# Patient Record
Sex: Female | Born: 1965 | Race: Black or African American | Hispanic: No | Marital: Single | Smoking: Never smoker
Health system: Southern US, Community
[De-identification: ages and names within clinical notes are randomized; demographics above are authoritative.]

## PROBLEM LIST (undated history)

## (undated) DIAGNOSIS — IMO0001 Reserved for inherently not codable concepts without codable children: Secondary | ICD-10-CM

## (undated) DIAGNOSIS — T7840XA Allergy, unspecified, initial encounter: Secondary | ICD-10-CM

## (undated) DIAGNOSIS — K429 Umbilical hernia without obstruction or gangrene: Secondary | ICD-10-CM

## (undated) DIAGNOSIS — M25569 Pain in unspecified knee: Secondary | ICD-10-CM

## (undated) DIAGNOSIS — K5790 Diverticulosis of intestine, part unspecified, without perforation or abscess without bleeding: Secondary | ICD-10-CM

## (undated) DIAGNOSIS — G8929 Other chronic pain: Secondary | ICD-10-CM

## (undated) DIAGNOSIS — I10 Essential (primary) hypertension: Secondary | ICD-10-CM

## (undated) DIAGNOSIS — Z780 Asymptomatic menopausal state: Secondary | ICD-10-CM

## (undated) DIAGNOSIS — K449 Diaphragmatic hernia without obstruction or gangrene: Secondary | ICD-10-CM

## (undated) DIAGNOSIS — K5720 Diverticulitis of large intestine with perforation and abscess without bleeding: Secondary | ICD-10-CM

## (undated) HISTORY — DX: Umbilical hernia without obstruction or gangrene: K42.9

## (undated) HISTORY — PX: MYOMECTOMY: SHX85

## (undated) HISTORY — DX: Asymptomatic menopausal state: Z78.0

## (undated) HISTORY — DX: Essential (primary) hypertension: I10

## (undated) HISTORY — DX: Diverticulosis of intestine, part unspecified, without perforation or abscess without bleeding: K57.90

## (undated) HISTORY — DX: Reserved for inherently not codable concepts without codable children: IMO0001

## (undated) HISTORY — DX: Diaphragmatic hernia without obstruction or gangrene: K44.9

## (undated) HISTORY — PX: TONSILLECTOMY: SUR1361

## (undated) HISTORY — DX: Allergy, unspecified, initial encounter: T78.40XA

## (undated) HISTORY — DX: Diverticulitis of large intestine with perforation and abscess without bleeding: K57.20

## (undated) HISTORY — PX: CHOLECYSTECTOMY: SHX55

---

## 1997-07-28 ENCOUNTER — Other Ambulatory Visit: Admission: RE | Admit: 1997-07-28 | Discharge: 1997-07-28 | Payer: Self-pay | Admitting: *Deleted

## 1997-09-01 ENCOUNTER — Other Ambulatory Visit: Admission: RE | Admit: 1997-09-01 | Discharge: 1997-09-01 | Payer: Self-pay | Admitting: Family Medicine

## 1997-11-02 ENCOUNTER — Inpatient Hospital Stay (HOSPITAL_COMMUNITY): Admission: AD | Admit: 1997-11-02 | Discharge: 1997-11-02 | Payer: Self-pay | Admitting: *Deleted

## 1998-11-13 ENCOUNTER — Emergency Department (HOSPITAL_COMMUNITY): Admission: EM | Admit: 1998-11-13 | Discharge: 1998-11-13 | Payer: Self-pay | Admitting: Emergency Medicine

## 1999-10-10 ENCOUNTER — Encounter: Payer: Self-pay | Admitting: Obstetrics & Gynecology

## 1999-10-10 ENCOUNTER — Inpatient Hospital Stay (HOSPITAL_COMMUNITY): Admission: AD | Admit: 1999-10-10 | Discharge: 1999-10-10 | Payer: Self-pay | Admitting: Obstetrics & Gynecology

## 2001-08-15 ENCOUNTER — Other Ambulatory Visit: Admission: RE | Admit: 2001-08-15 | Discharge: 2001-08-15 | Payer: Self-pay | Admitting: *Deleted

## 2003-01-21 ENCOUNTER — Emergency Department (HOSPITAL_COMMUNITY): Admission: EM | Admit: 2003-01-21 | Discharge: 2003-01-21 | Payer: Self-pay | Admitting: *Deleted

## 2005-01-02 ENCOUNTER — Encounter (INDEPENDENT_AMBULATORY_CARE_PROVIDER_SITE_OTHER): Payer: Self-pay | Admitting: *Deleted

## 2005-01-02 ENCOUNTER — Ambulatory Visit (HOSPITAL_COMMUNITY): Admission: EM | Admit: 2005-01-02 | Discharge: 2005-01-03 | Payer: Self-pay | Admitting: *Deleted

## 2006-09-05 ENCOUNTER — Emergency Department (HOSPITAL_COMMUNITY): Admission: EM | Admit: 2006-09-05 | Discharge: 2006-09-05 | Payer: Self-pay | Admitting: Emergency Medicine

## 2008-01-10 ENCOUNTER — Inpatient Hospital Stay (HOSPITAL_COMMUNITY): Admission: EM | Admit: 2008-01-10 | Discharge: 2008-01-13 | Payer: Self-pay | Admitting: Emergency Medicine

## 2008-01-10 ENCOUNTER — Ambulatory Visit: Payer: Self-pay | Admitting: Cardiology

## 2008-01-10 ENCOUNTER — Encounter (INDEPENDENT_AMBULATORY_CARE_PROVIDER_SITE_OTHER): Payer: Self-pay | Admitting: Internal Medicine

## 2008-01-13 ENCOUNTER — Encounter (INDEPENDENT_AMBULATORY_CARE_PROVIDER_SITE_OTHER): Payer: Self-pay | Admitting: Internal Medicine

## 2008-01-13 ENCOUNTER — Ambulatory Visit: Payer: Self-pay | Admitting: Surgery

## 2008-01-27 ENCOUNTER — Ambulatory Visit: Payer: Self-pay | Admitting: Family Medicine

## 2008-12-22 ENCOUNTER — Emergency Department (HOSPITAL_COMMUNITY): Admission: EM | Admit: 2008-12-22 | Discharge: 2008-12-22 | Payer: Self-pay | Admitting: Emergency Medicine

## 2009-03-30 ENCOUNTER — Encounter (INDEPENDENT_AMBULATORY_CARE_PROVIDER_SITE_OTHER): Payer: Self-pay | Admitting: *Deleted

## 2009-04-09 ENCOUNTER — Ambulatory Visit: Payer: Self-pay | Admitting: Internal Medicine

## 2009-04-09 DIAGNOSIS — I10 Essential (primary) hypertension: Secondary | ICD-10-CM | POA: Insufficient documentation

## 2009-04-12 ENCOUNTER — Encounter (INDEPENDENT_AMBULATORY_CARE_PROVIDER_SITE_OTHER): Payer: Self-pay | Admitting: *Deleted

## 2009-04-23 ENCOUNTER — Ambulatory Visit: Payer: Self-pay | Admitting: Internal Medicine

## 2009-04-27 LAB — CONVERTED CEMR LAB
Basophils Absolute: 0.1 10*3/uL (ref 0.0–0.1)
Basophils Relative: 1.9 % (ref 0.0–3.0)
CO2: 33 meq/L — ABNORMAL HIGH (ref 19–32)
Chloride: 103 meq/L (ref 96–112)
Eosinophils Relative: 5.1 % — ABNORMAL HIGH (ref 0.0–5.0)
MCV: 96 fL (ref 78.0–100.0)
Monocytes Relative: 7.5 % (ref 3.0–12.0)
Neutro Abs: 1.7 10*3/uL (ref 1.4–7.7)
Neutrophils Relative %: 34.8 % — ABNORMAL LOW (ref 43.0–77.0)
RBC: 4.02 M/uL (ref 3.87–5.11)

## 2009-05-04 ENCOUNTER — Emergency Department (HOSPITAL_COMMUNITY): Admission: EM | Admit: 2009-05-04 | Discharge: 2009-05-04 | Payer: Self-pay | Admitting: Emergency Medicine

## 2009-07-02 ENCOUNTER — Ambulatory Visit: Payer: Self-pay | Admitting: Internal Medicine

## 2009-07-02 ENCOUNTER — Encounter (INDEPENDENT_AMBULATORY_CARE_PROVIDER_SITE_OTHER): Payer: Self-pay | Admitting: *Deleted

## 2009-09-07 ENCOUNTER — Emergency Department (HOSPITAL_COMMUNITY): Admission: EM | Admit: 2009-09-07 | Discharge: 2009-09-07 | Payer: Self-pay | Admitting: Emergency Medicine

## 2009-09-07 ENCOUNTER — Encounter: Payer: Self-pay | Admitting: Internal Medicine

## 2009-10-13 ENCOUNTER — Emergency Department (HOSPITAL_COMMUNITY): Admission: EM | Admit: 2009-10-13 | Discharge: 2009-10-13 | Payer: Self-pay | Admitting: Family Medicine

## 2009-10-15 ENCOUNTER — Ambulatory Visit: Payer: Self-pay | Admitting: Internal Medicine

## 2009-10-15 DIAGNOSIS — J189 Pneumonia, unspecified organism: Secondary | ICD-10-CM

## 2009-10-18 ENCOUNTER — Telehealth (INDEPENDENT_AMBULATORY_CARE_PROVIDER_SITE_OTHER): Payer: Self-pay | Admitting: *Deleted

## 2009-10-18 ENCOUNTER — Encounter (INDEPENDENT_AMBULATORY_CARE_PROVIDER_SITE_OTHER): Payer: Self-pay | Admitting: *Deleted

## 2009-10-19 LAB — CONVERTED CEMR LAB
Calcium: 9.2 mg/dL (ref 8.4–10.5)
Glucose, Bld: 89 mg/dL (ref 70–99)
Sodium: 136 meq/L (ref 135–145)

## 2009-10-20 ENCOUNTER — Encounter (INDEPENDENT_AMBULATORY_CARE_PROVIDER_SITE_OTHER): Payer: Self-pay | Admitting: *Deleted

## 2009-10-30 ENCOUNTER — Emergency Department (HOSPITAL_COMMUNITY): Admission: EM | Admit: 2009-10-30 | Discharge: 2009-10-30 | Payer: Self-pay | Admitting: Emergency Medicine

## 2010-03-24 NOTE — Letter (Signed)
Summary: sprain finger   Emergency Department Visit / [facility]   Imported By: Lennie Odor 09/15/2009 13:51:39  _____________________________________________________________________  External Attachment:    Type:   Image     Comment:   External Document

## 2010-03-24 NOTE — Letter (Signed)
Summary: [place] No Show Letter  [facility] [place]  [address] [place], [place] [address]   Phone: [phone]  Fax: [phone]    07/02/2009 MRN: [id number]  [facility] [address] [place], [place]  [address]   Dear Ms. Wierzbicki,   Our records indicate that you missed your scheduled appointment with Dr. Drue Novel on 07/02/09.  Please contact this office to reschedule your appointment as soon as possible.  It is important that you keep your scheduled appointments with your physician, so we can provide you the best care possible.  Please be advised that there may be a charge for "no show" appointments.    Sincerely,    at [facility]

## 2010-03-24 NOTE — Progress Notes (Signed)
Summary: Still not feeling better....  Phone Note Call from Patient Call back at [facility] Phone [phone]   Caller: Patient Summary of Call: Patient called and LM on triage VM stating that she was still having diarrhea and needs an inhaler because she is a little SOB. Will call back for more information.   Patient is still having severe diarrhea, everytime she has to urinate it is coming out like water. She is taking the Levaquin and it is not helping. Patient was supposed to return to work today but was unable to because of the diahrrea. Patient said that her chest felt tight on saturday and used a warm cloth to help relieve this. SHe is taking the Mucinex D and it has helped break up the congestion a little and was thinking she might need an ihaler or should she let this take its course. Please advise.  Initial call taken by: Harold Barban,  October 18, 2009 10:48 AM  Follow-up for Phone Call        hold Levaquin for 2 days Pepto-Bismol for diarrhea Start  Align ( probiotic) one tablet daily continue Mucinex DM let us know if she is not slowly improving her potassium is slightly low, start KCl 10 mEq daily, call 30 and 3 RF Follow-up by: Jose E. Paz MD,  October 18, 2009 1:35 PM  Additional Follow-up for Phone Call Additional follow up Details #1::        Patient is aware, samples given of Align with coupons.  Pharmacy: [facility] [address] Additional Follow-up by: Harold Barban,  October 18, 2009 1:40 PM    New/Updated Medications: KLOR-CON 10 10 MEQ CR-TABS (POTASSIUM CHLORIDE) 1 by mouth daily. Prescriptions: KLOR-CON 10 10 MEQ CR-TABS (POTASSIUM CHLORIDE) 1 by mouth daily.  #30 x 3   Entered by:   Army Fossa CMA   Authorized by:   Nolon Rod. Paz MD   Signed by:   Army Fossa CMA on 10/18/2009   Method used:   Electronically to        [facility] (retail)       [address]       [place], [place]  [address]       Ph: [phone]  Fax: [phone]   RxID:   [id number]

## 2010-03-24 NOTE — Letter (Signed)
Summary: Primary Care Consult Scheduled Letter  [facility] [place]  [address] [place], [place] [address]   Phone: [phone]  Fax: [phone]      04/12/2009 MRN: [id number]  [facility] [address] [place], [place]  [address]    Dear [name],    We have scheduled an appointment for you.  At the recommendation of Dr. Willow Ora, we have scheduled you a consult with Dr. Conley Simmonds with [facility] on 04-30-2009 arrive 8:00am.  Their address is [address], [address], [place] [address]. The office phone number is [phone].  If this appointment day and time is not convenient for you, please feel free to call the office of the doctor you are being referred to at the number listed above and reschedule the appointment.    It is important for you to keep your scheduled appointments. We are here to make sure you are given good patient care.   Thank you,    Renee, Patient Care Coordinator [facility] [place]    **IF YOU ARE UNABLE TO KEEP THIS APPOINTMENT, OR NEED TO RESCHEDULE, PLEASE GIVE DR. SILVA'S OFFICE A 24 HOUR NOTICE TO AVOID ANY FEES**

## 2010-03-24 NOTE — Assessment & Plan Note (Signed)
Summary: bp check,bmp,cbc, dx hypertension/kdc  Nurse Visit   Vital Signs:  Patient profile:   45 year old female Height:      69 inches Weight:      213 pounds BMI:     31.57 Pulse rate:   86 / minute BP sitting:   122 / 80  Impression & Recommendations:  Problem # 1:  HYPERTENSION (ICD-401.9) BP better Her updated medication list for this problem includes:    Hydrochlorothiazide 25 Mg Tabs (Hydrochlorothiazide) .Marland Kitchen... 1 by mouth once daily    Clonidine Hcl 0.2 Mg Tabs (Clonidine hcl) .Marland Kitchen..Marland Kitchen Two times a day  Orders: Venipuncture (98119) TLB-BMP (Basic Metabolic Panel-BMET) (80048-METABOL) TLB-CBC Platelet - w/Differential (85025-CBCD) Est. Patient Level I ([address])  BP today: 122/80 Prior BP: 170/120 (04/09/2009)  Complete Medication List: 1)  Hydrochlorothiazide 25 Mg Tabs (Hydrochlorothiazide) .Marland Kitchen.. 1 by mouth once daily 2)  Clonidine Hcl 0.2 Mg Tabs (Clonidine hcl) .... Two times a day  CC: bp check  S Hawks, CMA   Allergies: No Known Drug Allergies  Orders Added: 1)  Venipuncture [36415] 2)  TLB-BMP (Basic Metabolic Panel-BMET) [80048-METABOL] 3)  TLB-CBC Platelet - w/Differential [85025-CBCD] 4)  Est. Patient Level I [95621]

## 2010-03-24 NOTE — Assessment & Plan Note (Signed)
Summary: FOLLOWUP FROM UC PER UC NEEDED FOLLOWUP WITH 2 DAYS/KN   Vital Signs:  Patient profile:   45 year old female Weight:      214.25 pounds O2 Sat:      93 % on Room air Temp:     99.4 degrees F oral Pulse rate:   97 / minute Pulse rhythm:   regular BP sitting:   126 / 84  (left arm) Cuff size:   large  Vitals Entered By: Army Fossa CMA (October 15, 2009 10:30 AM)  O2 Flow:  Room air CC: F/u from UC- diagonoised with Pneumoia.    History of Present Illness: the patient was evaluated at the urgent care on 10-13-09 She had frontal-around the eyes headaches, chills, fever, some cough chart was reviewed :  A chest x-ray showed a question of lower lobe pneumonia CBC showed white count of 6.5, hemoglobin 11.9, platelets 337 she was prescribed clarithromycin which she is taking  ROS Not feeling much better, still feeling weak, has postnasal dripping and cough. Still has head pressure No appetite Has developed diarrhea which is watery, nonbloody. Symptoms are started after she started antibiotics No nausea vomiting As far as her blood pressure, she has checked that rarely. At the urgent for her blood pressure was 168/97.  Current Medications (verified): 1)  Hydrochlorothiazide 25 Mg Tabs (Hydrochlorothiazide) .Marland Kitchen.. 1 By Mouth Once Daily 2)  Clonidine Hcl 0.2 Mg Tabs (Clonidine Hcl) .... Two Times A Day 3)  Biaxin 500 Mg Tabs (Clarithromycin) .Marland Kitchen.. 1 By Mouth Two Times A Day  Allergies (verified): No Known Drug Allergies  Past History:  Past Medical History: Reviewed history from 04/09/2009 and no changes required. G1 P1 Hypertension Dx at age 70 menopause (LMP age 45)  Past Surgical History: Reviewed history from 04/09/2009 and no changes required. Cholecystectomy Tonsillectomy  Social History: Reviewed history from 04/09/2009 and no changes required. 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   Head:  face symmetric, nontender to palpation Eyes:  external ocular movements intact Nose:  definitely congested Lungs:  normal respiratory effort, no intercostal retractions, no accessory muscle use, and normal breath sounds.  no increased work of breathing Heart:  normal rate, regular rhythm, no murmur, and no gallop.     Impression & Recommendations:  Problem # 1:  PNEUMONIA (ICD-486) Assessment New patient presents with fever, cough, head pressure, see HPI Likely has pneumonia and sinusitis Biaxin is causing diarrhea plan:  See instructions Her updated medication list for this problem includes:    Levaquin 500 Mg Tabs (Levofloxacin) .Marland Kitchen... 1 by mouth daily for one week  Problem # 2:  HYPERTENSION (ICD-401.9) BP seems to okay today, no change Her updated medication list for this problem includes:    Hydrochlorothiazide 25 Mg Tabs (Hydrochlorothiazide) .Marland Kitchen... 1 by mouth once daily    Clonidine Hcl 0.2 Mg Tabs (Clonidine hcl) .Marland Kitchen..Marland Kitchen Two times a day  Orders: Venipuncture ([address]) TLB-BMP (Basic Metabolic Panel-BMET) (80048-METABOL) Specimen Handling ([address])  BP today: 126/84 Prior BP: 122/80 (04/23/2009)  Labs Reviewed: K+: 3.4 (04/23/2009) Creat: : 0.8 (04/23/2009)     Complete Medication List: 1)  Hydrochlorothiazide 25 Mg Tabs (Hydrochlorothiazide) .Marland Kitchen.. 1 by mouth once daily 2)  Clonidine Hcl 0.2 Mg Tabs (Clonidine hcl) .... Two times a day 3)  Levaquin 500 Mg Tabs (Levofloxacin) .Marland Kitchen.. 1 by mouth daily for one week  Patient Instructions:  1)  rest, fluids, Tylenol 500 mg one or 2 tablets every 6 hours as needed for fever or pain 2)  switch to Levaquin, a new antibiotic 3)  Mucinex DM twice a day for one week as needed for cough 4)  Use the samples of ASTEPRO 2  sprays on each side of the nose daily to help the nose congestion 5)  Come back in 3 months for a  office visit 6)  Call the office if you're not well in 2 weeks  or at any time if you feel worse Prescriptions: LEVAQUIN 500 MG TABS (LEVOFLOXACIN) 1 by mouth daily for one week  #7 x 0   Entered and Authorized by:   Nolon Rod. Paz MD   Signed by:   Nolon Rod. Paz MD on 10/15/2009   Method used:   Print then Give to Patient   RxID:   [id number]

## 2010-03-24 NOTE — Assessment & Plan Note (Signed)
Summary: NEW TO EST,BLD PRESSURE,UHC INS/RH.....   Vital Signs:  Patient profile:   45 year old female LMP:     2006 Height:      69 inches Weight:      213 pounds BMI:     31.57 O2 Sat:      99 % on Room air Temp:     98.6 degrees F oral Pulse rate:   79 / minute BP sitting:   170 / 120  (left arm) Cuff size:   large  Vitals Entered By: Shary Decamp (April 09, 2009 11:02 AM)  O2 Flow:  Room air CC: new pt, elevated BP LMP (date): 2006     Enter LMP: 2006 Last PAP Result normal   Serial Vital Signs/Assessments:  Time      Position  BP       Pulse  Resp  Temp     By 11:04 AM  R Arm     166/120                        Shary Decamp   History of Present Illness: patient was diagnosed with hypertension at age 87 , was essentially untreated x years  Two years ago she briefly used  lisinopril but she self discontinued because she "felt bad"  on  November 2010, apparently her blood pressure was extremely high, she was seen at [facility]. She was started on hydrochlorothiazide and clonodine, since then she f/u  at the urgent care as needed. With above  medication, her blood pressure was within normal limits according to the patient, and  she was feeding well.  She is here  to be established on for a follow-up on her BP. She ran out of medications two weeks ago.  Preventive Screening-Counseling & Management  Alcohol-Tobacco     Smoking Status: never     Passive Smoke Exposure: no  Caffeine-Diet-Exercise     Caffeine use/day: 0     Does Patient Exercise: no      Drug Use:  no.    Current Medications (verified): 1)  Hydrochlorothiazide 25 Mg Tabs (Hydrochlorothiazide) .Marland Kitchen.. 1 By Mouth Once Daily 2)  Clonidine Hcl 0.2 Mg Tabs (Clonidine Hcl) .... 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 CV:  Denies chest pain or discomfort and swelling of feet. Resp:  Denies cough and shortness of breath. GI:  Denies diarrhea, nausea, and vomiting. Neuro:  mild HAs .  Physical Exam  General:  alert, well-developed, and overweight-appearing.   Neck:  no thyromegaly.   Lungs:  normal respiratory effort, no intercostal retractions, no accessory muscle use, and normal breath sounds.   Heart:  normal rate, regular rhythm, no murmur, and no gallop.   Abdomen:  soft, non-tender, no distention, and no masses.   Extremities:  no edema Psych:  not  anxious appearing and not depressed appearing.     Impression & Recommendations:  Problem # 1:  HYPERTENSION (ICD-401.9) see HPI off  medications for two weeks, reportedly her BP was well controlled while on medications restart medication see instructions EKG NSR Her updated medication list for this problem includes:    Hydrochlorothiazide 25 Mg Tabs (Hydrochlorothiazide) .Marland Kitchen... 1 by mouth once daily    Clonidine Hcl 0.2 Mg Tabs (Clonidine hcl) .Marland Kitchen..Marland Kitchen Two times a day  Orders: EKG w/ Interpretation (93[address])  Problem # 2:  ROUTINE GENERAL MEDICAL EXAM@HEALTH  CARE FACL (ICD-V70.0) Assessment: Comment Only  has not seen a gynecologist in a while, referral  Orders: Gynecologic Referral (Gyn)  Complete Medication List: 1)  Hydrochlorothiazide 25 Mg Tabs (Hydrochlorothiazide) .Marland Kitchen.. 1 by mouth once daily 2)  Clonidine Hcl 0.2 Mg Tabs (Clonidine hcl) .... Two times a day  Patient Instructions: 1)  restart meds  2)  nurse  visit in two weeks: BP check, BMP, CBC Dx  hypertension 3)  Please schedule a follow-up appointment in 3 months .  Prescriptions: CLONIDINE HCL 0.2 MG TABS (CLONIDINE HCL) two times a day  #60 x 3   Entered and Authorized by:   Elita Quick E. Ariadna Setter MD   Signed by:   Nolon Rod. Saisha Hogue MD on 04/09/2009   Method used:   Print then Give to Patient   RxID:   [id number] HYDROCHLOROTHIAZIDE 25 MG TABS (HYDROCHLOROTHIAZIDE) 1 by mouth once daily  #30 x 3   Entered and Authorized by:   Nolon Rod. Creedon Danielski MD   Signed by:   Nolon Rod. Buel Molder MD on 04/09/2009   Method used:   Print then Give to Patient   RxID:   [id number]    Risk Factors:  Tobacco use:  never Passive smoke exposure:  no Drug use:  no Caffeine use:  0 drinks per day Alcohol use:  yes    Comments:  socially Exercise:  no  PAP Smear History:     Date of Last PAP Smear:  02/21/2008    Results:  normal     Immunization History:  Tetanus/Td Immunization History:    Tetanus/Td:  historical (02/20/2006)  Influenza Immunization History:    Influenza:  historical (11/20/2008)    Preventive Care Screening  Pap Smear:    Date:  02/21/2008    Results:  normal   Last Tetanus Booster:    Date:  02/20/2006    Results:  Historical

## 2010-03-24 NOTE — Miscellaneous (Signed)
  Clinical Lists Changes  Medications: Changed medication from KLOR-CON 10 10 MEQ CR-TABS (POTASSIUM CHLORIDE) 1 by mouth daily. to KLOR-CON M20 20 MEQ CR-TABS (POTASSIUM CHLORIDE CRYS CR) 1 by mouth daily

## 2010-03-24 NOTE — Letter (Signed)
Summary: Out of Work  [facility]  [address] [place], [place] [address]   Phone: [phone]  Fax: [phone]    October 18, 2009   Employee:  LISAANNE LAWRIE    To Whom It May Concern:   For Medical reasons, please excuse the above named employee from work for the following dates:  Start: 10/18/09    End: 10/18/09  If you need additional information, please feel free to contact our office.         Sincerely,    Harold Barban

## 2010-03-24 NOTE — Letter (Signed)
Summary: New Patient Letter  [facility] [place]  [address] [place], [place] [address]   Phone: [phone]  Fax: [phone]       03/30/2009 MRN: [id number]  [facility] [address] [place], [place]  [address]  Dear Ms. Sara Norris,   Welcome to [facility] and thank you for choosing [place] as your Primary Care Providers. Enclosed you will find information about our practice that we hope you find helpful. We have also enclosed forms to be filled out prior to your visit. This will provide [place] with the necessary information and facilitate your being seen in a timely manner. If you have any questions, please call us at: [phone] and we will be happy to assist you. We look forward to seeing you at your scheduled appointment time.  Appointment Friday, April 09, 2009 at 10:00am  with Dr. Willow Ora  Sincerely,  Primary Health Care Team  Please arrive 15 minutes early for your first appointment and bring your insurance card. Co-pay is required at the time of your visit.  *****Please call the office if you are not able to keep this appointment. There is a charge of $50.00 if any appointment is not cancelled or rescheduled within 24 hours.

## 2010-05-03 ENCOUNTER — Telehealth (INDEPENDENT_AMBULATORY_CARE_PROVIDER_SITE_OTHER): Payer: Self-pay | Admitting: *Deleted

## 2010-05-06 LAB — DIFFERENTIAL
Eosinophils Relative: 6 % — ABNORMAL HIGH (ref 0–5)
Monocytes Absolute: 0.6 10*3/uL (ref 0.1–1.0)
Neutro Abs: 3.5 10*3/uL (ref 1.7–7.7)
Neutrophils Relative %: 54 % (ref 43–77)

## 2010-05-06 LAB — CBC
HCT: 35.6 % — ABNORMAL LOW (ref 36.0–46.0)
Hemoglobin: 11.9 g/dL — ABNORMAL LOW (ref 12.0–15.0)
MCHC: 33.4 g/dL (ref 30.0–36.0)
Platelets: 337 10*3/uL (ref 150–400)
WBC: 6.5 10*3/uL (ref 4.0–10.5)

## 2010-05-19 NOTE — Progress Notes (Signed)
Summary: clonidine, hctx refill--pt is out  Phone Note Refill Request Call back at Home Phone [phone] Message from:  Patient on May 03, 2010 4:46 PM  Refills Requested: Medication #1:  CLONIDINE HCL 0.2 MG TABS two times a day. DUE FOR OFFICE VISIT.  Medication #2:  HYDROCHLOROTHIAZIDE 25 MG TABS 1 by mouth once daily [facility] [place]     ----pt has been out of meds for two days, please fill as soon as possible  Next Appointment Scheduled: none Initial call taken by: Jerolyn Shin,  May 03, 2010 4:47 PM  Follow-up for Phone Call        Pt is due for an OV. Army Fossa CMA  May 03, 2010 4:54 PM   Additional Follow-up for Phone Call Additional follow up Details #1::        ok  30 days, 1 Rf no further RF w/o OV Jose E. Paz MD  May 05, 2010 1:13 PM     Additional Follow-up for Phone Call Additional follow up Details #2::    Left message for pt to call back. Army Fossa CMA  May 05, 2010 1:38 PM  Per Minerva Areola conversion, will sign off on this note---will make copy so it can be tracked.Marland KitchenMarland KitchenJerolyn Shin  May 09, 2010 5:10 PM   Prescriptions: CLONIDINE HCL 0.2 MG TABS (CLONIDINE HCL) two times a day. DUE FOR OFFICE VISIT.  #60 x 0   Entered by:   Army Fossa CMA   Authorized by:   Nolon Rod. Paz MD   Signed by:   Army Fossa CMA on 05/03/2010   Method used:   Electronically to        [facility] (retail)       [address]       [place], [place]  [address]       Ph: [phone]       Fax: [phone]   RxID:   [id number] HYDROCHLOROTHIAZIDE 25 MG TABS (HYDROCHLOROTHIAZIDE) 1 by mouth once daily  #30 Each x 0   Entered by:   Army Fossa CMA   Authorized by:   Nolon Rod. Paz MD   Signed by:   Army Fossa CMA on 05/03/2010   Method used:   Electronically to        [facility] (retail)       [address]       [place], [place]  [address]       Ph: [phone]       Fax:  [phone]   RxID:   [id number]

## 2010-05-21 ENCOUNTER — Encounter: Payer: Self-pay | Admitting: Internal Medicine

## 2010-05-24 ENCOUNTER — Ambulatory Visit (INDEPENDENT_AMBULATORY_CARE_PROVIDER_SITE_OTHER): Payer: 59 | Admitting: Internal Medicine

## 2010-05-24 ENCOUNTER — Encounter: Payer: Self-pay | Admitting: Internal Medicine

## 2010-05-24 ENCOUNTER — Ambulatory Visit: Payer: Self-pay | Admitting: Internal Medicine

## 2010-05-24 DIAGNOSIS — I10 Essential (primary) hypertension: Secondary | ICD-10-CM

## 2010-05-24 DIAGNOSIS — Z0000 Encounter for general adult medical examination without abnormal findings: Secondary | ICD-10-CM | POA: Insufficient documentation

## 2010-05-24 NOTE — Progress Notes (Signed)
  Subjective:    Patient ID: Sara Norris, female    DOB: 1965-10-21, 45 y.o.   MRN: [id number]  HPI  Was seen 09-2009 ; at that time she had pneumonia, she recovered completely  Her blood pressure was okay and continue to be well controlled per pt  Past Medical History  Diagnosis Date  . Hypertension     dx age 55  . Menopause     LMP age 18     Review of Systems  HENT:       H/o allergies: throat , eyes itching, SX > night. Allegra helps   Cardiovascular: Negative for chest pain, palpitations and leg swelling.  Genitourinary:       ++ hot flashes   Neurological: Negative for dizziness. Headaches: "allergy HAs"       Objective:   Physical Exam  Constitutional: She appears well-developed.       overweight  Cardiovascular: Normal rate, regular rhythm and normal heart sounds.   Pulmonary/Chest: Effort normal and breath sounds normal. No respiratory distress. She has no wheezes. She has no rales.  Musculoskeletal: She exhibits no edema.  Psychiatric: She has a normal mood and affect. Her behavior is normal. Judgment and thought content normal.          Assessment & Plan:

## 2010-05-24 NOTE — Assessment & Plan Note (Signed)
Refer to a new Gyn See instructions

## 2010-05-24 NOTE — Assessment & Plan Note (Addendum)
Well controlled Self d/c K+ suplements, eating more bananas Labs, if K+ still low consider change to maxzide

## 2010-05-25 LAB — BASIC METABOLIC PANEL
BUN: 16 mg/dL (ref 6–23)
CO2: 30 mEq/L (ref 19–32)
Calcium: 9.6 mg/dL (ref 8.4–10.5)
Creatinine, Ser: 0.9 mg/dL (ref 0.4–1.2)
GFR: 93.15 mL/min (ref >60.00)
Potassium: 4.2 mEq/L (ref 3.5–5.1)
Sodium: 140 mEq/L (ref 135–145)

## 2010-05-30 ENCOUNTER — Telehealth: Payer: Self-pay | Admitting: *Deleted

## 2010-05-30 NOTE — Telephone Encounter (Signed)
Message copied by Army Fossa on Mon May 30, 2010 11:33 AM ------      Message from: Sara Norris      Created: Thu May 26, 2010  3:02 PM       Advise patient:      Her potassium is normal. Good results, continue with same medicines

## 2010-05-30 NOTE — Telephone Encounter (Signed)
Message left for patient to return my call.  

## 2010-05-31 NOTE — Telephone Encounter (Signed)
I spoke w/ pt she is aware.  

## 2010-06-29 ENCOUNTER — Other Ambulatory Visit: Payer: Self-pay | Admitting: *Deleted

## 2010-06-29 MED ORDER — CLONIDINE HCL 0.2 MG PO TABS: 0.2000 mg | ORAL_TABLET | Freq: Two times a day (BID) | ORAL | Status: DC

## 2010-06-30 ENCOUNTER — Other Ambulatory Visit: Payer: Self-pay | Admitting: *Deleted

## 2010-06-30 MED ORDER — CLONIDINE HCL 0.2 MG PO TABS: 0.2000 mg | ORAL_TABLET | Freq: Two times a day (BID) | ORAL | Status: DC

## 2010-07-05 NOTE — Consult Note (Signed)
NAME:  Sara Norris, BUDGE NO.:  [id number]   MEDICAL RECORD NO.:  [id number]          PATIENT TYPE:  EMS   LOCATION:  ED                           FACILITY:  [facility]   PHYSICIAN:  Levert Feinstein, MD          DATE OF BIRTH:  1965-09-08   DATE OF CONSULTATION:  01/10/2008  DATE OF DISCHARGE:                                 CONSULTATION   REFERRING PHYSICIAN:  April Palumbo-Rasch, MD   CHIEF COMPLAINT:  This is acute stroke consult, potential TPA candidate,  from the [facility] ER physician Dr. April Palumbo-Rasch.   HISTORY OF PRESENT ILLNESS:  The patient is a 45 year old, right-handed  [place] female, developed few weak history of hollow cranial  pressure headache, with elevated blood pressure. This morning at work,  she noticed some right facial numbness and feeling generalized fatigue  and weakness.  She went downstairs to get some aspirin.  She reported  she  developed hyperventilation, later passed out, and code blue was  called.  The blood pressure was 195/120, glucose was 137.  The patient  was tachycardic.  Her blood pressure was much improved after she arrived  in ED, getting labetalol, and she overall reported feeling much better.   She had a history of hypertension for more than 20 years.  Supposed to  take lisinopril.  Recently switched to hydrochlorothiazide, but she has  not been taking any medication for 6 months, is in the process of  finding a primary care.  Mother at the bedside also reported that the  patient is under a lot of stress recently, financial stress, and her ex-  husband is critically ill.   REVIEW OF SYSTEMS:  The patient denies headache, chest pain, lateralized  motor sensory deficit, vision change, speech difficulty.  There was some  intermittent right facial numbness.  It actually has been going on for 2  days.   PAST MEDICAL HISTORY:  Hypertension.   SURGICAL HISTORY:  Cholecystectomy.   SOCIAL HISTORY:  She works at [employer], cleaning  patient's rooms, for 2 years.  Denies smoking, drinks, or illicit drug  use.   FAMILY HISTORY:  Significant for hypertension, diabetes.   PHYSICAL EXAMINATION:  Initial blood pressure was 195/121, heart rate of  93, and when I checked on her and after labetalol treatment, blood  pressure still elevated at 180/100, heart rate of 89.  CARDIAC:  Regular rate and rhythm.  PULMONARY:  Clear to auscultation bilaterally.  NECK:  Supple.  No carotid bruits.  NEUROLOGIC EXAMINATION:  She is awake, alert, oriented to history taking  and casual conversation.  There was no dysarthria and no aphasia.  Cranial nerves II-XII:  Pupils equal, round, reactive to light.  Right  fundi were sharp.  Arterioles were thinning and shiny and extraocular  movements were full.  Facial sensation and strength was normal.  Uvula  and tongue midline.  Head-turning and shoulder-shrugging were normal and  symmetric.  Tongue protrusion and cheek strength was normal.  Visual  fields were full on confrontational test.  Motor examination:  Normal tone, power and strength.  SENSORY:  Normal  to light touch, pinprick.  Deep tendon reflex was normal, symmetric.  Plantar responses were flexor.  COORDINATION:  Normal finger-to-nose,  heel-to-shin.  Gait was deferred.   CAT scan of the brain revealed it was normal.  There was no acute  change.   LAB EVALUATION:  INR 1.0.  Normal CBC, CMP   ASSESSMENT/PLAN:  A 45 year old female, noncompliant with her  medication, presenting with syncope, hyperventilation anxiety episode  with a mild right facial numbness, essentially normal neurological  examination.  Differentiation diagnosis including hypertension, urgency,  less likely represents a true stroke.   1. She is advised to continue to take her blood pressure medication.      Goal blood pressure is less than 130/80.  2. Aspirin every day.  3. MRI of brain.   Thank you for the  consult.  Please call for new questions.      Levert Feinstein, MD  Electronically Signed     YY/MEDQ  D:  01/10/2008  T:  01/10/2008  Job:  [id number]

## 2010-07-05 NOTE — H&P (Signed)
NAME:  Sara Norris, Sara Norris NO.:  [id number]   MEDICAL RECORD NO.:  [id number]          PATIENT TYPE:  EMS   LOCATION:  ED                           FACILITY:  [facility]   PHYSICIAN:  Hillery Aldo, M.D.   DATE OF BIRTH:  1965-06-11   DATE OF ADMISSION:  01/10/2008  DATE OF DISCHARGE:                              HISTORY & PHYSICAL   PRIMARY CARE PHYSICIAN:  None.   CHIEF COMPLAINTS:  Syncope, right facial paresthesias.   HISTORY OF PRESENT ILLNESS:  The patient is a 45 year old female with  past medical history of hypertension.  She has not taken any  antihypertensive medications for approximately the last 6 months.  Over  the past 2 days, she has noticed intermittent right-sided facial  paresthesias which became concerning to her today.  While at work today,  the patient began to tell coworkers that she was suffering with a  headache and did not feel well.  She subsequently sat down and per her  coworkers' report started shaking all over and subsequently slid on to  the floor.  There was no loss of bladder control or mouth trauma noted.  She apparently lost consciousness for a few seconds twice afterwards and  was subsequently brought to the emergency department for evaluation  where she was noted to have a blood pressure of 195/121.  The patient is  being admitted for further evaluation and workup along with treatment of  her hypertensive crisis.   PAST MEDICAL HISTORY:  1. Untreated hypertension, previously treated with      hydrochlorothiazide, none in the last 6 months.  2. Laparoscopic cholecystectomy in November of 2006.   FAMILY HISTORY:  The patient's mother is 23 and has diabetes and  hypertension.  The patient's father is in his 48s and has hypertension.  She has two healthy siblings.   SOCIAL HISTORY:  The patient is divorced.  She lives with her 15-year-  old son.  She is a lifelong nonsmoker.  She drinks alcohol rarely on  social occasions.  She  denies any drug use.  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   DRUG ALLERGIES:  CODEINE.  The patient is also intolerant of LISINOPRIL.   CURRENT MEDICATIONS:  None   REVIEW OF SYSTEMS:  The patient denies fever or chills.  She reports  headache as noted above.  No nausea, vomiting or diarrhea.  Bowels are  moving normally.  No melena or hematochezia.  No chest pain.  She had  some dyspnea with the episode of syncope today but otherwise does not  suffer with dyspnea.  She denies cough.  Her energy level was  diminished.  She reports a 2-3 pound weight gain in the past 6 months.  She denies any diplopia, but did have some transient blurry vision.   PHYSICAL EXAM:  VITAL SIGNS:  Temperature 98.4, pulse 84, respirations  18, blood pressure on arrival 195/121, most recently recorded blood  pressure 128/84.  O2 saturation 100% on room air.  GENERAL:  Obese black female in no acute distress.  HEENT:  Normocephalic, atraumatic.  PERRL.  EOMI.  Visual fields are  full without deficits.  Oropharynx is clear.  Tongue is midline.  Palate  rises symmetrically.  NECK:  Supple, no thyromegaly, no lymphadenopathy, no jugular venous  distention.  CHEST:  Lungs clear to auscultation bilaterally with good air movement.  HEART:  Regular rate, rhythm.  No murmurs, rubs, or gallops.  ABDOMEN:  Soft, nontender, nondistended with normoactive bowel sounds.  EXTREMITIES:  No clubbing, edema, cyanosis.  SKIN:  Warm and dry.  No rashes.  NEUROLOGICAL:  The patient is alert and oriented x3.  Cranial nerves II-  XII grossly intact.  Moves all extremities x4 with equal strength.  Nonfocal.   DATA REVIEW:  Chest x-ray was negative for acute cardiovascular disease.  CT scan of the head was negative.   LABORATORY DATA:  Beta HCG testing was negative.  Ammonia was 42.  Lactic acid was 2.4.  CK was 200, MB 2.1, troponin-I 0.01.  White blood  cell count was 6.7,  hemoglobin 12.6, hematocrit 37, platelets 321.  Sodium is 140, potassium 3.4, chloride 106, bicarb 27, BUN 17,  creatinine 0.96, glucose 118.  LFTs were within normal limits.  PT was  12.1, PTT 31.  Urinalysis was negative for nitrites with trace  leukocytes.   ASSESSMENT AND PLAN:  1. Hypertensive crisis/syncope:  The patient's syncope was likely due      to hypertensive urgency and crisis.  We will admit the patient to      the telemetry floor and monitor her blood pressure closely.  We      will start her empirically on Norvasc and hydrochlorothiazide and      use clonidine p.r.n.  The patient had labetalol in the emergency      department but reports a heavy sensation in her limbs and this is      disconcerting to her so we will try to avoid labetalol if possible.  2. Transient ischemic attack:  The patient's report of transient right-      sided facial paresthesia is certainly concerning for a transient      ischemic attack.  Given her hypertensive crisis the patient will      need further risk factor modification by checking a fasting lipid      panel and homocysteine value.  We will also check a hemoglobin A1c      and a 12-lead EKG.  Will check an MRI/MRA of the brain, two-      dimensional echocardiogram, carotid Dopplers for full stroke      workup.  Will start her on low-dose aspirin therapy.  We will      attempt blood pressure control and will look to control any other      modifiable risk factor she has.  3. Obesity:  Will obtain a dietician consult for weight loss      management.  4. Prophylaxis:  Will initiate deep venous thrombosis prophylaxis with      Lovenox and gastrointestinal prophylaxis will be held given that      she is not critically ill at this time and does not have complaints      of reflux type symptoms.      Hillery Aldo, M.D.  Electronically Signed     CR/MEDQ  D:  01/10/2008  T:  01/10/2008  Job:  [id number]

## 2010-07-05 NOTE — Discharge Summary (Signed)
NAME:  Sara Norris, Sara Norris NO.:  [id number]   MEDICAL RECORD NO.:  [id number]          PATIENT TYPE:  INP   LOCATION:  1419                         [facility]ACILITY:  [facility]   PHYSICIAN:  Hillery Aldo, M.D.   DATE OF BIRTH:  12-16-1965   DATE OF ADMISSION:  01/10/2008  DATE OF DISCHARGE:  01/13/2008                               DISCHARGE SUMMARY   PRIMARY CARE PHYSICIAN:  None.  The patient will be referred to  [facility] for hospital followup.   DISCHARGE DIAGNOSES:  1. Hypertensive urgency.  2. Transient ischemic attack.  3. Syncope.  4. Dyslipidemia.  5. Anxiety.  6. Obesity.  7. Hypokalemia.   DISCHARGE MEDICATIONS:  1. Norvasc 10 mg p.o. daily.  2. Aspirin 81 mg p.o. daily.  3. Clonidine 0.2 mg p.o. q.12 hours.  4. Hydrochlorothiazide 25 mg p.o. daily.  5. Potassium chloride 20 mEq p.o. daily.  6. Simvastatin 40 mg p.o. q.p.m.   CONSULTATIONS:  Dr. Chauncey Reading of Neurology.   BRIEF ADMISSION HISTORY OF PRESENT ILLNESS:  The patient is a 45-year-  old female with past medical history of hypertension who has not been  taking her medications regularly and developed 2 day history of  intermittent right-sided facial paresthesias.  She subsequently became  concerned when she began to feel unwell and had a headache.  She then  sat down in the presence of her coworkers and began to shake and  subsequently slid to the floor, losing consciousness for a few seconds.  She was brought to the emergency department for further evaluation and  workup when she was noted to have a blood pressure of 195/121.  For full  details, please see my dictated report.   PROCEDURES AND DIAGNOSTIC STUDIES:  1. CT scan of the head on January 10, 2008 showed no acute findings.  2. Chest x-ray on January 10, 2008 showed no acute infiltrate or      edema.  Mild elevation of right hemidiaphragm.  3. MRI/MRA of the brain on January 10, 2008 showed no acute infarct.      Mild nonspecific white  matter type changes.  Minimal asymmetry of      Meckel's cave and slight lateral downsloping of the sella floor,      likely an incidental finding.  Mild mucosal thickening of the      paranasal sinuses.  The MRA shows mild intracranial atherosclerotic      type changes.  4. A 2D echocardiogram on January 10, 2008 showed normal left      ventricular systolic function with ejection fraction estimated at      60% - 65%.  No diagnostic evidence of left ventricular regional      wall motion abnormality.  Left ventricular wall thickness was      mildly increased.  Features were consistent with pseudonormal left      ventricular filling pattern, concomitant abnormal relaxation and      increased filling pressure, left atrial size was at the upper      limits of normal.  5. Carotid Dopplers are pending at the time  of this dictation.   DISCHARGE LABORATORY VALUES:  Sodium was 139, potassium 3.8, chloride  102, bicarb 29, BUN 21, creatinine 0.89, glucose 80.  Homocystine was  8.9.  TSH was 0.506.  Hemoglobin A1c was 6%.  Total cholesterol was 216,  triglycerides 122, HDL 46, LDL 146.  White blood cell count was 4.9,  hemoglobin 12.8, hematocrit 37.5, platelets 331.   HOSPITAL COURSE BY PROBLEM:  1. Hypertensive urgency:  Patient was admitted and responded well to      intravenous labetalol.  She was subsequently started on Norvasc and      hydrochlorothiazide as well as clonidine for blood pressure control      and her blood pressure has been well-controlled on this triple drug      regimen.  Patient was taught extensively about the importance of      controlling her blood pressure to prevent problems such as heart      failure down the road.  2. Transient ischemic attack/syncope:  The patient did undergo a full      stroke workup at this time.  Because of her small vessel disease,      we recommend daily aspirin and strict risk factor reduction      including blood pressure control as well  as dyslipidemia control.      Carotid Dopplers are pending but will be reviewed prior to      discharge.  Patient did not have an elevated homocystine level.  3. Dyslipidemia:  Patient did have evidence of suboptimal LDL and      therefore was put on statin therapy.  She should have a followup      fasting lipid panel and check of her liver function studies done in      6 week's time.  4. Anxiety:  Patient has remained stable.  5. Obesity:  Patient was seen in consultation with dietician.  She was      found to have a BMI of 30 and was given extensive instructions on      fat reduction of her diet as well as weight loss.  6. Hypokalemia:  Patient is appropriately repleted.   DISPOSITION:  Patient will be medically stable for discharge after her  Dopplers are done.  She will be set up to follow up with [facility] as  she does not have medical insurance at this time.  She is currently  employed and states she will have medical insurance as of January.  Patient was counseled extensively regarding the importance of taking her  medications as prescribed and to not skip doses.  She is instructed to  followup for routine monitoring of her blood pressure and electrolyte  through [facility].      Hillery Aldo, M.D.  Electronically Signed     CR/MEDQ  D:  01/13/2008  T:  01/13/2008  Job:  [id number]   cc:   Melvern Banker  Fax: [phone]

## 2010-07-08 NOTE — H&P (Signed)
NAME:  Sara Norris, Sara Norris NO.:  [id number]   MEDICAL RECORD NO.:  [id number]          PATIENT TYPE:  EMS   LOCATION:  ED                           FACILITY:  [facility]   PHYSICIAN:  Sandria Bales. Ezzard Standing, M.D.  DATE OF BIRTH:  06/07/1965   DATE OF ADMISSION:  01/02/2005  DATE OF DISCHARGE:                                HISTORY & PHYSICAL   HISTORY OF ILLNESS:  This is a 45 year old black female who has no primary  medical doctor but does see Dr. Gildardo Griffes for gynecologic exams who  has had some vague abdominal pain going on and off for weeks if not months.  However, yesterday she had some cabbage and some meat, and developed severe  abdominal pain and presented to the [facility] emergency room this morning.  She was seen by Dr. Mariel Aloe who has evaluated her and was found to have  by ultrasound what appears to be an impacted gallstone in the neck of the  gallbladder with maybe some gallbladder wall thickening.   She has had nausea and vomiting with this episode. She denies any history of  peptic ulcer disease, though she thinks she may have gastritis, at least  that what she has been blaming her vague abdominal pain on. She has no  history of liver disease, pancreatic disease, or colon disease. Her mother  had gallbladder disease.   She did have a naval ring she took out months if not longer where she has a  rash kind of around her umbilicus.   PAST MEDICAL HISTORY:  She is allergic to CODEINE, which makes her sick. She  is on no medication.   REVIEW OF SYSTEMS:  NEUROLOGIC:  No history of seizure or loss of  consciousness.  PULMONARY:  She does not smoke cigarettes. No history of pneumonia or  tuberculosis.  CARDIAC:  She has had hypertension. She is currently off her medicine. She  says the medicine she took make her sluggish and she is not seeing anybody  for her hypertension right now. She has no heart disease, chest pain,  angina.  GASTROINTESTINAL:   See history of present illness.  UROLOGIC:  No kidney stones or kidney infections.  GYN:  She has one son who is a 15 year old boy at home who is now with his  dad today. She is divorced.  MUSCULOSKELETAL:  She has no upper and lower extremity complaints. 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   PHYSICAL EXAMINATION:  VITAL SIGNS:  Her temperature is 98.0, blood pressure  176/102, pulse is 89, respirations are 20.  GENERAL:  She is a well-nourished, pleasant black female, alert and  cooperative on physical exam.  HEENT:  Unremarkable.  NECK:  Supple. I feel no masses or thyromegaly.  BREASTS:  I did not examine her breasts.  LUNGS:  Clear to auscultation.  HEART:  Has a regular rate and rhythm. I hear no murmur or rub.  ABDOMEN:  Soft. She has maybe decreased bowel sounds but she has some  tenderness with mild guarding in the right upper quadrant  consistent with  gallbladder disease. She has also some acne which is periumbilical.  EXTREMITIES:  She has good strength to the upper and lower extremities.  NEUROLOGIC:  Grossly intact.   LABORATORY DATA:  Labs that I have show a white blood count of 8400,  hemoglobin 14, hematocrit 44. Sodium 137, potassium 3.2, chloride of 99, CO2  of 27, glucose of 131, BUN of 9. Her lipase is 22. Her urinalysis is  negative.  I reviewed her ultrasound with Stevphen Meuse which does appear to have a  distended gallbladder with a possibly thickened gallbladder wall and  evidence of impacted gallstone.   ADMISSION DIAGNOSES:  1.  Cholecystitis. Discussed with the patient the indication, potential      complications of gallbladder disease. Will plan to go ahead today with      laparoscopic cholecystectomy. Discussed with her risks which include      bleeding, infection, bile duct injury, and the possibility of open      surgery, but I think she understands and is aware of all. I talked about      letting her go home but she would prefer having her  surgery done today      and again, I think she is tender enough and has acute cholecystitis, it      is worth going ahead.  2.  Hypertension. I am not sure how well this is treated or how bad it is      right now. Her blood pressure may be related to her pain.  3.  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      Sandria Bales. Ezzard Standing, M.D.  Electronically Signed     DHN/MEDQ  D:  01/02/2005  T:  01/02/2005  Job:  [id number]   cc:   Pershing Cox, M.D.  Fax: [phone]

## 2010-07-08 NOTE — Op Note (Signed)
NAME:  Sara Norris, Sara Norris NO.:  [id number]   MEDICAL RECORD NO.:  [id number]          PATIENT TYPE:  INP   LOCATION:  0101                         FACILITY:  [facility]   PHYSICIAN:  Sandria Bales. Ezzard Standing, M.D.  DATE OF BIRTH:  1965-02-28   DATE OF PROCEDURE:  01/02/2005  DATE OF DISCHARGE:                                 OPERATIVE REPORT   PREOPERATIVE DIAGNOSIS:  Acute cholecystitis with impacted gallstones.   POSTOPERATIVE DIAGNOSIS:  Acute cholecystitis with impacted gallstones.   PROCEDURE:  Laparoscopic cholecystectomy with intraoperative cholangiogram.   SURGEON:  Sandria Bales. Ezzard Standing, M.D.   ASSISTANT:  Currie Paris, M.D.   ANESTHESIA:  General endotracheal.   ESTIMATED BLOOD LOSS:  Minimal.   INDICATIONS FOR PROCEDURE:  Ms. Schuermann is a 45 year old black female who  presented to the [facility] emergency room with acute onset of abdominal  pain yesterday.  Evaluation revealed right upper quadrant tenderness, and  ultrasound, consistent with gallstones with a dilated gallbladder and mildly  thickened gallbladder wall, consistent with acute cholecystitis.     The indications and potential complications of the operation were  explained to the patient.  Potential complications included but were not  limited to bleeding, infection, bile duct injury and open surgery.  Patient  now comes for attempted laparoscopic cholecystectomy.   OPERATION:  Patient in supine position.  Both arms are out.  Her abdomen was prepped  with Betadine solution and sterilely draped.  An infraumbilical incision was  made with sharp dissection carried down to the abdominal cavity.  A 0 degree  10 mm laparoscope was inserted through a 12 mm Hasson trocar secured with a  0 Vicryl suture.  The abdominal exploration was carried out with the scope,  revealing right and left lobes of the liver, anterior wall of the stomach,  and the bowel, which I could see, was all normal.  The patient had a  distended gallbladder with some hemorrhage of the wall, consistent with  acute cholecystitis.  I placed three different trocars, a 10 mm subxiphoid  trocar, a 5 mm right mid subcostal, and a 5 mm left subcostal trocar.  The  first thing I did was decompress the gallbladder with a suction trocar.  I  then started dissection out along the cystic duct gallbladder junction and  identified the cystic artery.  I then exposed the triangle of Calot,  identified the cystic duct and gallbladder.  I then performed an  intraoperative cholangiogram.   The intraoperative cholangiogram was shot using a 14 gauge Jelco inserted  into the abdominal cavity with a cut-off taut catheter inserted into the  Jelco into the side of the cut cystic duct.  Secured the taut catheter.  I  then used about 6 cc of half-strength Hypaque solution under fluoroscopy.  This showed a free flow of contrast into the cystic duct, into the common  bile duct, into the duodenum, and up the hepatic radicals.  There was no  filling defect or evidence of abnormalities.  This was felt to be a normal  intraoperative cholangiogram.   I  then removed the taut catheter.  Place a triple clip across the cystic  duct on the side of the cystic duct and divided the cystic duct.  I then  sharply and bluntly dissected the gallbladder from the gallbladder bed.  I  then placed a gallbladder in the EndoCatch bag and delivered it through the  umbilicus.  She had at least three fairly large stones, probably 1.5 cm or  bigger, which were removed with the gallbladder.  I then irrigated the  abdominal cavity with about 1 liter of saline.  I examined the triangle of  Calot in the gallbladder bed.  There was no bile leak.  There was no  bleeding.  Then the trocars were then returned and turned under direct  visualization.  There was no bleeding at any trocar site.  The umbilical  trocar was closed with a 0 Vicryl suture.   The skin site closed with 5-0  Vicryl Monocryl suture.  The skin was  infiltrated with about 15 cc of 0.25% Marcaine.  The wound was then painted  with tincture of Benzoin, Steri-Striped, and then sterilely dressed.  Patient  tolerated the procedure well and was transported to the recovery  room in good condition.  Sponge and needle counts were correct at the end of  the case.      Sandria Bales. Ezzard Standing, M.D.  Electronically Signed     DHN/MEDQ  D:  01/02/2005  T:  01/02/2005  Job:  [id number]

## 2010-07-28 ENCOUNTER — Other Ambulatory Visit: Payer: Self-pay | Admitting: Internal Medicine

## 2010-11-22 LAB — CBC
HCT: 37.5
Hemoglobin: 12.6
MCHC: 33.9
MCV: 91.2
Platelets: 321
Platelets: 331
RBC: 4.12
WBC: 4.9

## 2010-11-22 LAB — TROPONIN I: Troponin I: 0.01

## 2010-11-22 LAB — LIPID PANEL
Total CHOL/HDL Ratio: 4.7
VLDL: 24

## 2010-11-22 LAB — BASIC METABOLIC PANEL
BUN: 14
BUN: 15
BUN: 21
CO2: 29
Chloride: 101
Chloride: 102
Chloride: 104
GFR calc Af Amer: >60
GFR calc Af Amer: >60
GFR calc non Af Amer: >60
GFR calc non Af Amer: >60
Glucose, Bld: 112 — ABNORMAL HIGH
Potassium: 3.3 — ABNORMAL LOW
Potassium: 3.6
Potassium: 3.8
Sodium: 137
Sodium: 142

## 2010-11-22 LAB — PROTIME-INR: Prothrombin Time: 12.9

## 2010-11-22 LAB — HEMOGLOBIN A1C: Hgb A1c MFr Bld: 6

## 2010-11-22 LAB — DIFFERENTIAL
Basophils Relative: 1
Eosinophils Absolute: 0.4
Monocytes Absolute: 0.3
Neutro Abs: 3.3
Neutrophils Relative %: 49

## 2010-11-22 LAB — URINALYSIS, ROUTINE W REFLEX MICROSCOPIC
Bilirubin Urine: NEGATIVE
Glucose, UA: NEGATIVE
Hgb urine dipstick: NEGATIVE
Nitrite: NEGATIVE
pH: 6.5

## 2010-11-22 LAB — COMPREHENSIVE METABOLIC PANEL
ALT: 25
BUN: 17
Calcium: 9.4
Chloride: 106
GFR calc Af Amer: >60
Glucose, Bld: 118 — ABNORMAL HIGH
Potassium: 3.4 — ABNORMAL LOW
Sodium: 140

## 2010-11-22 LAB — AMMONIA: Ammonia: 42 — ABNORMAL HIGH

## 2010-11-22 LAB — URINE MICROSCOPIC-ADD ON

## 2010-11-22 LAB — TSH: TSH: 0.506

## 2010-11-22 LAB — CK TOTAL AND CKMB (NOT AT ARMC): Relative Index: 1.1

## 2011-01-15 LAB — HM PAP SMEAR: HM Pap smear: NORMAL

## 2011-08-04 ENCOUNTER — Other Ambulatory Visit: Payer: Self-pay | Admitting: Internal Medicine

## 2011-08-04 NOTE — Telephone Encounter (Signed)
Refill done. Pt must have schedule an appt for further refills.

## 2011-08-06 ENCOUNTER — Other Ambulatory Visit: Payer: Self-pay | Admitting: Internal Medicine

## 2011-08-11 ENCOUNTER — Ambulatory Visit (INDEPENDENT_AMBULATORY_CARE_PROVIDER_SITE_OTHER): Payer: 59 | Admitting: Internal Medicine

## 2011-08-11 VITALS — BP 142/90 | HR 67 | Temp 98.1°F | Ht 67.5 in | Wt 206.0 lb

## 2011-08-11 DIAGNOSIS — I10 Essential (primary) hypertension: Secondary | ICD-10-CM

## 2011-08-11 LAB — CBC WITH DIFFERENTIAL/PLATELET
Basophils Absolute: 0.1 10*3/uL (ref 0.0–0.1)
Eosinophils Absolute: 0.2 10*3/uL (ref 0.0–0.7)
HCT: 36.4 % (ref 36.0–46.0)
Hemoglobin: 12.2 g/dL (ref 12.0–15.0)
Lymphs Abs: 2.3 10*3/uL (ref 0.7–4.0)
MCHC: 33.4 g/dL (ref 30.0–36.0)
Monocytes Absolute: 0.4 10*3/uL (ref 0.1–1.0)
Neutro Abs: 2.4 10*3/uL (ref 1.4–7.7)
Platelets: 360 10*3/uL (ref 150.0–400.0)
RDW: 13.8 % (ref 11.5–14.6)

## 2011-08-11 LAB — BASIC METABOLIC PANEL
BUN: 17 mg/dL (ref 6–23)
CO2: 29 mEq/L (ref 19–32)
Glucose, Bld: 81 mg/dL (ref 70–99)
Potassium: 3.5 mEq/L (ref 3.5–5.1)
Sodium: 142 mEq/L (ref 135–145)

## 2011-08-11 MED ORDER — CLONIDINE HCL 0.3 MG PO TABS: 0.3000 mg | ORAL_TABLET | Freq: Two times a day (BID) | ORAL | Status: DC

## 2011-08-11 MED ORDER — HYDROCHLOROTHIAZIDE 25 MG PO TABS: 25.0000 mg | ORAL_TABLET | Freq: Every day | ORAL | Status: DC

## 2011-08-11 NOTE — Progress Notes (Signed)
  Subjective:    Patient ID: Sara Norris, female    DOB: March 22, 1965, 46 y.o.   MRN: [id number]  HPI Patient is here for the management of hypertension. Good medication compliance  except for the last 3 days when she ran out of clonidin. Ambulatory BPs around 138/92.   Past Medical History: G1 P1 Hypertension Dx at age 62 menopause (LMP age 89)  Past Surgical History: Cholecystectomy Tonsillectomy  Social History: Single, 1 child  Occupation: Hotel manager  at  [employer] tobacco-- no ETOH-- socially   Family History: CAD - no stroke - no colon Ca - no breast Ca - no HTN - M, F, GP DM - M, F, GP  Review of Systems Reports low-salt diet. No chest pain or shortness of breath No headaches or lower extremity edema Patient is not taking any potassium supplements. She is eating healthier, has lost a few pounds.     Objective:   Physical Exam General -- alert, well-developed. No apparent distress.  Lungs -- normal respiratory effort, no intercostal retractions, no accessory muscle use, and normal breath sounds.   Heart-- normal rate, regular rhythm, no murmur, and no gallop.   Extremities-- no pretibial edema bilaterally  Neurologic-- alert & oriented X3 and strength normal in all extremities. Psych-- Cognition and judgment appear intact. Alert and cooperative with normal attention span and concentration.  not anxious appearing and not depressed appearing.       Assessment & Plan:

## 2011-08-11 NOTE — Patient Instructions (Addendum)
Check the  blood pressure 2 or 3 times a week, be sure it is between 110/60 and 140/85. If it is consistently higher or lower, let me know  

## 2011-08-11 NOTE — Assessment & Plan Note (Addendum)
Good medication compliance except for the last 3 days, BP slightly elevated today but also elevated in the ambulatory setting with a DBP of 92. Plan: Labs, if the potassium is low, will change from hydrochlorothiazide to Maxzide. Increase clonidine from 0.2----> 0.3 twice a day. See instructions'

## 2011-08-11 NOTE — Telephone Encounter (Signed)
rx was sent in on 6.14.13.

## 2011-08-13 ENCOUNTER — Encounter: Payer: Self-pay | Admitting: Internal Medicine

## 2011-08-14 ENCOUNTER — Encounter: Payer: Self-pay | Admitting: *Deleted

## 2012-01-29 ENCOUNTER — Telehealth: Payer: Self-pay | Admitting: Internal Medicine

## 2012-01-29 NOTE — Telephone Encounter (Signed)
[facility] Triage Call Report Triage Record Num: [id number] Operator: Maryfrances Bunnell Patient Name: Sara Norris Call Date & Time: 01/27/2012 7:36:54AM Patient Phone: [phone] PCP: Nolon Rod. Paz Patient Gender: Female PCP Fax : Patient DOB: 1965/10/07 Practice Name:  [facility] Reason for Call: Caller: Amberrose/Patient; PCP: Willow Ora; CB#: [phone]; Call regarding BP 218/130; Patient unable to make it to pharmacy last night before they closed so dose was missed last night and this am of Clonidine 0.3mg . Pharmacy is currently closed. Last office visit per emr 08/11/11. Currently at her work at [employer] and bp checked by a Herbalist. Describes very mild ha felt, no change in vision, no chest pain. Per Hypertension of more than 180 mmHg or diastolic blood pressure of more than 120 mmHg standing orders used and Clonidine verified per chart of 0.3mg  bid. Called to [facility] [phone]/Dr. Harland Dingwall on call md. Care advice given. Emergent symptoms reveiwed. Protocol(s) Used: Hypertension, Diagnosed or Suspected Recommended Outcome per Protocol: See Provider within 4 hours Reason for Outcome: Systolic blood pressure of more than 180 mmHg OR diastolic blood pressure of more than 120 mmHg Care Advice: ~ Another adult should drive. Call EMS 911 if new symptoms develop, such as severe shortness of breath, chest pain, change in mental status, acute neurologic deficit, seizure, visual disturbances, pulse rate > 120 / minute, or very irregular pulse. ~ ~ Call provider if symptoms worsen or new symptoms develop. ~ HEALTH PROMOTION / MAINTENANCE ~ CAUTIONS ~ List, or take, all current prescription(s), nonprescription or alternative medication(s) to provider for evaluation. Medication Advice: - Discontinue all nonprescription and alternative medications, especially stimulants, until evaluated by provider. - Take prescribed medications as directed, following label  instructions for the medication. - Do not change medications or dosing regimen until provider is consulted. - Know possible side effects of medication and what to do if they occur. - Tell provider all prescription, nonprescription or alternative medications that you take ~

## 2012-01-29 NOTE — Telephone Encounter (Signed)
Please advise 

## 2012-01-29 NOTE — Telephone Encounter (Signed)
This phone call was 2 days ago, BP was elevated. Call patient,   BP better now?Marland Kitchen Also she is due for a checkup this month, be sure she has an appointment

## 2012-01-29 NOTE — Telephone Encounter (Signed)
lmovm for pt to return call.  

## 2012-04-05 ENCOUNTER — Ambulatory Visit: Payer: Self-pay | Admitting: Internal Medicine

## 2012-04-09 ENCOUNTER — Telehealth: Payer: Self-pay | Admitting: Internal Medicine

## 2012-04-09 DIAGNOSIS — Z1231 Encounter for screening mammogram for malignant neoplasm of breast: Secondary | ICD-10-CM

## 2012-04-09 DIAGNOSIS — Z0000 Encounter for general adult medical examination without abnormal findings: Secondary | ICD-10-CM

## 2012-04-09 NOTE — Telephone Encounter (Signed)
Please arrange a gynecology referral and a mammogram. Patient is also due for a routine checkup here, please arrange no urgent, .

## 2012-04-09 NOTE — Telephone Encounter (Signed)
pt is requesting a referrail for GYN & mammogram as she does not have one-pt has [facility]--cb# [phone]

## 2012-04-09 NOTE — Telephone Encounter (Signed)
Ok to arrange referral?

## 2012-04-11 ENCOUNTER — Telehealth: Payer: Self-pay | Admitting: Internal Medicine

## 2012-04-11 MED ORDER — LOSARTAN POTASSIUM-HCTZ 100-12.5 MG PO TABS: 1.0000 | ORAL_TABLET | Freq: Every day | ORAL | Status: DC

## 2012-04-11 NOTE — Telephone Encounter (Signed)
Patient Information:  Caller Name: Aldea  Phone: [phone]  Patient: Sara Norris, Sara Norris  Gender: Female  DOB: 1965/10/11  Age: 47 Years  PCP: Willow Ora  Pregnant: No  Office Follow Up:  Does the office need to follow up with this patient?: Yes  Instructions For The Office: Patient is aware she needs to be seen . Having difficulty getting off work.  She has taken Medication this morning as directed. Please contact her for appt.  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  Needs an appt for evaluation.  Symptoms  Reason For Call & Symptoms: Patient states her Blood pressure is High.  She took her pressure last night because of left face tingling it was 213/127.  Today, Blood pressure is 201/142 and 172/123. She states she is suppose to take Catapres 0.3mg  bid but usually only takes once because it makes her "sleepy" and she can't work.  Reviewed Health History In EMR: Yes  Reviewed Medications In EMR: Yes  Reviewed Allergies In EMR: Yes  Reviewed Surgeries / Procedures: Yes  Date of Onset of Symptoms: 04/10/2012  Treatments Tried: Took catapres as directed.  Treatments Tried Worked: No OB / GYN:  LMP: Unknown  Guideline(s) Used:  High Blood Pressure  Disposition Per Guideline:   See Today in Office  Reason For Disposition Reached:   BP > 180/110  Advice Given:  General:  Untreated high blood pressure may cause damage to the heart, brain, kidneys, and eyes.  Treatment of high blood pressure can reduce the risk of stroke, heart attack, and heart failure.  The goal of blood pressure treatment for most patients with hypertension is to keep the blood pressure under 140/90.  Lifestyle Changes  Maintain a healthy weight. Lose weight if you are overweight.  Do 30 minutes of aerobic physical activity (e.g., brisk walking) most days of the week.  Eat a diet  high in fresh fruits and low-fat dairy products. Limit your intake of saturated and total fat. Choose foods that are lower in salt.  If you smoke, you should stop.  If you drink alcohol, you should limit your daily alcohol drinking. Women should have no more than one drink per day. Men should have no more than 2 drinks per day. A drink is defined as 1.5 oz hard liquor (one shot or jigger; 45 ml), 5 oz wine (small glass; 150 ml), or 12 oz beer (one can; 360 ml).  Call Back If:  Headache, blurred vision, difficulty talking, or difficulty walking occurs  Chest pain or difficulty breathing occurs  You want to go in to the office for a blood pressure check  You become worse.  What to Do When You Miss a Dose of Your Blood Pressure Medication:  Generally, you should take a missed dose as soon as you remember.  If it is less than 8 hours until your next dose, then skip the missed dose and take the medicine at the next regularly scheduled time.  Do NOT take two doses of a blood pressure medication at the same time because you missed a dose

## 2012-04-11 NOTE — Telephone Encounter (Signed)
Referral entered  

## 2012-04-11 NOTE — Telephone Encounter (Signed)
Discussed with pt

## 2012-04-11 NOTE — Telephone Encounter (Signed)
Advise patient: Continue Catapres once daily Discontinue HCTZ Call losartan HCT 100-12.5 one daily. #30, no refills Continue monitoring BP, if the BPs are  consistently more than 180 or if she has headache, chest pain: go to the ER Arrange for office visit within 2 weeks

## 2012-04-11 NOTE — Telephone Encounter (Signed)
Patient states her Blood pressure is High. She took her pressure last night because of left face tingling it was 213/127. Today, Blood pressure is 201/142 and 172/123. She states she is suppose to take Catapres 0.3mg  bid but usually only takes once because it makes her "sleepy" and she can't work.  Please advise.

## 2012-04-17 ENCOUNTER — Telehealth: Payer: Self-pay | Admitting: Internal Medicine

## 2012-04-17 NOTE — Telephone Encounter (Signed)
Pt states Blood pressure is coming down since starting medications last 04/11/12, but still elevated. Today she is concerned that they are still up, feels fatigued and slightly dizzy. Please advise.

## 2012-04-17 NOTE — Telephone Encounter (Signed)
Patient Information:  Caller Name: Petrea  Phone: [phone]  Patient: Sara Norris, Sara Norris  Gender: Female  DOB: 09/18/1955  Age: 47 Years  PCP: Willow Ora  Office Follow Up:  Does the office need to follow up with this patient?: Yes  Instructions For The Office: Blood pressure is coming down since starting medications last 04/11/12, but still elevated.  Today she is concerned that they are still up, feels fatigued and slightly dizzy.  Please follow up with concerns and any adjustments needed.  RN Note:  Started blood pressure medication started last week on 02/20 with office reading 213/142;  Blood pressures are running this morning 172/102 and repeated 157/111 and last 150/110.  Has slight headache.  Feels fatigued.  Appetite is like normal; Is drinking well and urinating frequently.  Prior to today since starting medication has felt better.  Feels like heart is racing.  Pulse was in the 80s but feels like it is beating hard. Has had some slight dizziness.  Care advice with caller demonstrating her understanding.  Symptoms  Reason For Call & Symptoms: Pressure running high.  Reviewed Health History In EMR: Yes  Reviewed Medications In EMR: Yes  Reviewed Allergies In EMR: Yes  Reviewed Surgeries / Procedures: Yes  Date of Onset of Symptoms: 04/10/2012  Guideline(s) Used:  High Blood Pressure  Disposition Per Guideline:   Discuss with PCP and Callback by Nurse Today  Reason For Disposition Reached:   Taking BP medications and feels is having side effects (e.g., impotence, cough, dizziness)  Advice Given:  Lifestyle Modifications and Reason to call the office back.

## 2012-04-18 NOTE — Telephone Encounter (Signed)
Discussed with pt

## 2012-04-18 NOTE — Telephone Encounter (Addendum)
Keep BP log . ER if sx severe. OV ASAP

## 2012-04-18 NOTE — Telephone Encounter (Signed)
Scheduled pt appt 3.4.14. Pt states she took her BP this morning & it was 137/92. She has been taking the losartan/hctz for 1 week & pt would like to know if she can stop taking it & go back to taking the clonidine 1 tab BID. Pt states the clonidine is causing her to have palpitations. Please advise.

## 2012-04-18 NOTE — Telephone Encounter (Signed)
BP is not very high, recommend not to stop losartan HCT at this point.

## 2012-04-23 ENCOUNTER — Ambulatory Visit (INDEPENDENT_AMBULATORY_CARE_PROVIDER_SITE_OTHER): Payer: Commercial Managed Care - PPO | Admitting: Internal Medicine

## 2012-04-23 ENCOUNTER — Encounter: Payer: Self-pay | Admitting: Internal Medicine

## 2012-04-23 VITALS — BP 142/90 | HR 72 | Wt 197.0 lb

## 2012-04-23 DIAGNOSIS — I10 Essential (primary) hypertension: Secondary | ICD-10-CM

## 2012-04-23 LAB — CBC WITH DIFFERENTIAL/PLATELET
Basophils Absolute: 0.1 10*3/uL (ref 0.0–0.1)
HCT: 37.5 % (ref 36.0–46.0)
Lymphocytes Relative: 34.1 % (ref 12.0–46.0)
Lymphs Abs: 2.1 10*3/uL (ref 0.7–4.0)
Monocytes Relative: 7.5 % (ref 3.0–12.0)
Neutrophils Relative %: 49.9 % (ref 43.0–77.0)
Platelets: 426 10*3/uL — ABNORMAL HIGH (ref 150.0–400.0)
RDW: 13.2 % (ref 11.5–14.6)

## 2012-04-23 LAB — BASIC METABOLIC PANEL WITH GFR
CO2: 30 mEq/L (ref 19–32)
Calcium: 9.3 mg/dL (ref 8.4–10.5)
Potassium: 3.3 mEq/L — ABNORMAL LOW (ref 3.5–5.3)
Sodium: 141 mEq/L (ref 135–145)

## 2012-04-23 LAB — TSH: TSH: 0.45 u[IU]/mL (ref 0.35–5.50)

## 2012-04-23 MED ORDER — AMLODIPINE BESYLATE 5 MG PO TABS: 5.0000 mg | ORAL_TABLET | Freq: Every day | ORAL | Status: DC

## 2012-04-23 MED ORDER — LOSARTAN POTASSIUM-HCTZ 100-12.5 MG PO TABS: 1.0000 | ORAL_TABLET | Freq: Every day | ORAL | Status: DC

## 2012-04-23 NOTE — Patient Instructions (Addendum)
Exercise 30 minutes daily. Watch your salt intake Check the  blood pressure 2 or 3 times a week, be sure it is between 110/60 and 140/85. If it is consistently higher or lower, let me know Call if side effects Schedule a complete physical exam 3 or 4 months from now. Fasting.    Sodium-Controlled Diet Sodium is a mineral. It is found in many foods. Sodium may be found naturally or added during the making of a food. The most common form of sodium is salt, which is made up of sodium and chloride. Reducing your sodium intake involves changing your eating habits. The following guidelines will help you reduce the sodium in your diet:  Stop using the salt shaker.  Use salt sparingly in cooking and baking.  Substitute with sodium-free seasonings and spices.  Do not use a salt substitute (potassium chloride) without your caregiver's permission.  Include a variety of fresh, unprocessed foods in your diet.  Limit the use of processed and convenience foods that are high in sodium. USE THE FOLLOWING FOODS SPARINGLY: Breads/Starches  Commercial bread stuffing, commercial pancake or waffle mixes, coating mixes. Waffles. Croutons. Prepared (boxed or frozen) potato, rice, or noodle mixes that contain salt or sodium. Salted [place] fries or hash browns. Salted popcorn, breads, crackers, chips, or snack foods. Vegetables  Vegetables canned with salt or prepared in cream, butter, or cheese sauces. Sauerkraut. Tomato or vegetable juices canned with salt.  Fresh vegetables are allowed if rinsed thoroughly. Fruit  Fruit is okay to eat. Meat and Meat Substitutes  Salted or smoked meats, such as bacon or Canadian bacon, chipped or corned beef, hot dogs, salt pork, luncheon meats, pastrami, ham, or sausage. Canned or smoked fish, poultry, or meat. Processed cheese or cheese spreads, blue or Roquefort cheese. Battered or frozen fish products. Prepared spaghetti sauce. Baked beans. Reuben sandwiches. Salted  nuts. Caviar. Milk  Limit buttermilk to 1 cup per week. Soups and Combination Foods  Bouillon cubes, canned or dried soups, broth, consomm. Convenience (frozen or packaged) dinners with more than 600 mg sodium. Pot pies, pizza, Asian food, fast food cheeseburgers, and specialty sandwiches. Desserts and Sweets  Regular (salted) desserts, pie, commercial fruit snack pies, commercial snack cakes, canned puddings.  Eat desserts and sweets in moderation. Fats and Oils  Gravy mixes or canned gravy. No more than 1 to 2 tbs of salad dressing. Chip dips.  Eat fats and oils in moderation. Beverages  See those listed under the vegetables and milk groups. Condiments  Ketchup, mustard, meat sauces, salsa, regular (salted) and lite soy sauce or mustard. Dill pickles, olives, meat tenderizer. Prepared horseradish or pickle relish. Dutch-processed cocoa. Baking powder or baking soda used medicinally. Worcestershire sauce. "Light" salt. Salt substitute, unless approved by your caregiver. Document Released: 07/29/2001 Document Revised: 05/01/2011 Document Reviewed: 03/01/2009 [facility] Patient Information 2013 [place], [place].

## 2012-04-23 NOTE — Progress Notes (Signed)
  Subjective:    Patient ID: Sara Norris, female    DOB: 1965/09/06, 47 y.o.   MRN: [id number]  HPI Here for hypertension management. In the last 2 or 3 months, her BP was checked sporadically, was as high as 160, 190. She called: 04/11/2012, BP was 215/120. At the time she had some palpitations and left face numbness. Had no slurred speech, diplopia or focal weaknesses. BP medication was adjusted, see assessment and plan. Since then, ambulatory BPs are around 140/90.  Past Medical History  Diagnosis Date  . Hypertension     dx age 15  . Menopause     LMP age 15    Past Surgical History  Procedure Laterality Date  . Cholecystectomy    . 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 He has lost weight since the last time she was here, she thinks mostly due to to long work hours but  at the same times reports has decreased her sodas intake ; states she does not eat a lot of  salt. Currently denies chest pain or shortness or breath. No lower extremity edema. Denies headaches or nosebleeds.     Objective:   Physical Exam BP 142/90  Pulse 72  Wt 197 lb (89.359 kg)  BMI 30.38 kg/m2  SpO2 96%  General -- alert, well-developed,  Neck --no thyromegaly , normal carotid pulse Lungs -- normal respiratory effort, no intercostal retractions, no accessory muscle use, and normal breath sounds.   Heart-- normal rate, regular rhythm, no murmur, and no gallop.   Abdomen--soft, non-tender, no distention, no masses, no HSM, no guarding, and no rigidity. No bruit   Extremities-- no pretibial edema bilaterally Neurologic-- alert & oriented X3, speech  gait and motor are intact. Psych-- Cognition and judgment appear intact. Alert and cooperative with normal attention span and concentration.  not anxious appearing and not depressed appearing.       Assessment & Plan:

## 2012-04-23 NOTE — Assessment & Plan Note (Signed)
BP has not been well controlled in the last few months, was as high as 215/120 last month, since then HCTZ was changed to losartan HCT. She's taking clonodine at night only because it makes her sleepy. EKG today showed normal sinus rhythm. Plan: Discontinue clonidine Continue losartan HCT Add amlodipine, target BP 120/80 Watch for side effects if edema develops, consider coreg

## 2012-04-25 ENCOUNTER — Telehealth: Payer: Self-pay | Admitting: *Deleted

## 2012-04-25 ENCOUNTER — Encounter: Payer: Self-pay | Admitting: *Deleted

## 2012-04-25 NOTE — Telephone Encounter (Signed)
error 

## 2012-05-03 ENCOUNTER — Ambulatory Visit
Admission: RE | Admit: 2012-05-03 | Discharge: 2012-05-03 | Disposition: A | Discharge status: Home / Self Care | Payer: 59 | Source: Ambulatory Visit | Attending: Internal Medicine | Admitting: Internal Medicine

## 2012-05-07 ENCOUNTER — Other Ambulatory Visit: Payer: Self-pay | Admitting: Internal Medicine

## 2012-05-07 DIAGNOSIS — R928 Other abnormal and inconclusive findings on diagnostic imaging of breast: Secondary | ICD-10-CM

## 2012-05-16 ENCOUNTER — Other Ambulatory Visit: Payer: 59

## 2012-05-27 ENCOUNTER — Telehealth: Payer: Self-pay | Admitting: *Deleted

## 2012-05-27 NOTE — Telephone Encounter (Signed)
Pt states that the Norvasc causes her heart to race and she has become sleepless as well. Pt would like to go back to the clonidine that she was previously on. Pt notes that for the past 2 day she has not taken this med and her heart has not been racing .Please advise

## 2012-05-28 ENCOUNTER — Ambulatory Visit
Admission: RE | Admit: 2012-05-28 | Discharge: 2012-05-28 | Disposition: A | Discharge status: Home / Self Care | Payer: 59 | Source: Ambulatory Visit | Attending: Internal Medicine | Admitting: Internal Medicine

## 2012-05-28 DIAGNOSIS — R928 Other abnormal and inconclusive findings on diagnostic imaging of breast: Secondary | ICD-10-CM

## 2012-05-28 MED ORDER — CARVEDILOL 6.25 MG PO TABS: 6.2500 mg | ORAL_TABLET | Freq: Two times a day (BID) | ORAL | Status: DC

## 2012-05-28 NOTE — Telephone Encounter (Signed)
Discussed with pt

## 2012-05-28 NOTE — Telephone Encounter (Signed)
Advise patient: Discontinue amlodipine Start Coreg, prescription sent. Previously clonidine cause somnolence thus will try to stay away from it. Call with BP readings in 2 weeks, call if side effects (although coreg has a low  side effect profile ---> Let patient know)

## 2012-11-13 ENCOUNTER — Other Ambulatory Visit: Payer: Self-pay | Admitting: Internal Medicine

## 2012-11-14 NOTE — Telephone Encounter (Signed)
rx refilled per protocol. DJR  

## 2012-12-31 ENCOUNTER — Ambulatory Visit: Payer: 59 | Admitting: Internal Medicine

## 2013-01-14 ENCOUNTER — Ambulatory Visit (INDEPENDENT_AMBULATORY_CARE_PROVIDER_SITE_OTHER): Payer: 59 | Admitting: Internal Medicine

## 2013-01-14 ENCOUNTER — Encounter: Payer: Self-pay | Admitting: Internal Medicine

## 2013-01-14 VITALS — BP 174/111 | HR 78 | Temp 97.9°F | Wt 204.0 lb

## 2013-01-14 DIAGNOSIS — I10 Essential (primary) hypertension: Secondary | ICD-10-CM

## 2013-01-14 MED ORDER — CARVEDILOL 12.5 MG PO TABS: 12.5000 mg | ORAL_TABLET | Freq: Two times a day (BID) | ORAL | Status: DC

## 2013-01-14 NOTE — Assessment & Plan Note (Addendum)
Since the last time she was here she is doing well, she was intolerant to amlodipine . Currently on beta blockers and  losartan HCT, feeling great, BP today is elevated but reports ambulatory readings ok although she couldn't give me much information. Plan: Increase bystolic from 6.25 bid to 12.5 twice a day Continue other meds CMP, FLP Self monitor BPs, see instructions.

## 2013-01-14 NOTE — Progress Notes (Signed)
Pre visit review using our clinic review tool, if applicable. No additional management support is needed unless otherwise documented below in the visit note. 

## 2013-01-14 NOTE — Patient Instructions (Addendum)
Come back fasting for labs: CMP ,FLP--- dx  Hypertension  Check the  blood pressure   Weekly  be sure it is between 110/60 and 140/85. Ideal blood pressure is 120/80. If it is consistently higher or lower, let me know  Next visit in 4 months  for a physical exam  . Fasting Please make an appointment

## 2013-01-14 NOTE — Progress Notes (Signed)
  Subjective:    Patient ID: Sara Norris, female    DOB: 09-Mar-1965, 47 y.o.   MRN: [id number]  HPI Hypertension followup Good compliance w/ current  medication, feels very well. Has check  her BP at Work from time to time, she said BP was good, readings? 109/89?  Past Medical History  Diagnosis Date  . Hypertension     dx age 71  . Menopause     LMP age 14   Past Surgical History  Procedure Laterality Date  . Cholecystectomy    . Tonsillectomy     History   Social History  . Marital Status: Divorced    Spouse Name: N/A    Number of Children: 1  . Years of Education: N/A   Occupational History  . enviromental services  [employer]   Social History Main Topics  . Smoking status: Never Smoker   . Smokeless tobacco: Never Used  . Alcohol Use: Yes  . Drug Use: No     Comment: no marihuana ;lately   . Sexual Activity: Not on file   Other Topics Concern  . 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 She does not eat excessive salt. Has not been able to exercise much.     Objective:   Physical Exam BP 174/111  Pulse 78  Temp(Src) 97.9 F (36.6 C)  Wt 204 lb (92.534 kg)  SpO2 97% General -- alert, well-developed, NAD.  Lungs -- normal respiratory effort, no intercostal retractions, no accessory muscle use, and normal breath sounds.  Heart-- normal rate, regular rhythm, no murmur.  Extremities-- no pretibial edema bilaterally  Neurologic--  alert & oriented X3. Speech normal, gait normal, strength normal in all extremities.  Psych-- Cognition and judgment appear intact. Cooperative with normal attention span and concentration. No anxious appearing , no depressed appearing.     Assessment & Plan:

## 2013-01-24 ENCOUNTER — Other Ambulatory Visit (INDEPENDENT_AMBULATORY_CARE_PROVIDER_SITE_OTHER): Payer: 59

## 2013-01-24 DIAGNOSIS — I10 Essential (primary) hypertension: Secondary | ICD-10-CM

## 2013-01-24 LAB — COMPREHENSIVE METABOLIC PANEL
ALT: 21 U/L (ref 0–35)
AST: 18 U/L (ref 0–37)
Calcium: 9.4 mg/dL (ref 8.4–10.5)
Chloride: 101 mEq/L (ref 96–112)
Creatinine, Ser: 0.9 mg/dL (ref 0.4–1.2)
Total Bilirubin: 0.7 mg/dL (ref 0.3–1.2)

## 2013-01-24 LAB — LIPID PANEL
HDL: 52.6 mg/dL (ref >39.00)
VLDL: 23.4 mg/dL (ref 0.0–40.0)

## 2013-01-24 LAB — LDL CHOLESTEROL, DIRECT: Direct LDL: 147.1 mg/dL

## 2013-01-30 MED ORDER — POTASSIUM CHLORIDE ER 10 MEQ PO TBCR: 10.0000 meq | EXTENDED_RELEASE_TABLET | Freq: Every day | ORAL | Status: DC

## 2013-01-30 NOTE — Addendum Note (Signed)
Addended by: Eustace Quail on: 01/30/2013 01:24 PM   Modules accepted: Orders

## 2013-02-18 ENCOUNTER — Other Ambulatory Visit: Payer: Self-pay | Admitting: Internal Medicine

## 2013-02-18 NOTE — Telephone Encounter (Signed)
rx refilled per protocol. DJR  

## 2013-03-21 ENCOUNTER — Telehealth: Payer: Self-pay | Admitting: *Deleted

## 2013-03-21 NOTE — Telephone Encounter (Signed)
Spoke with patient and made aware that we only received the first 2 pages of the FMLA paperwork she sent. Patient to attempt to send again or will drop it off at the office.

## 2013-03-28 ENCOUNTER — Telehealth: Payer: Self-pay

## 2013-03-28 NOTE — Telephone Encounter (Addendum)
Received 03/28/2012 by RN  LVM to clarify what the patient is requesting.

## 2013-03-31 NOTE — Telephone Encounter (Signed)
Spoke with patient who states that she is requesting FMLA for extreme hypertension. States that we have filled these out before and that this is the yearly form that needs completion.  Note sent to PCP since there are not indications in chart that we have done this prior to this request.

## 2013-04-04 DIAGNOSIS — Z0279 Encounter for issue of other medical certificate: Secondary | ICD-10-CM

## 2013-04-04 NOTE — Telephone Encounter (Signed)
Sent for signature

## 2013-06-17 ENCOUNTER — Encounter: Payer: Self-pay | Admitting: Internal Medicine

## 2013-06-17 ENCOUNTER — Ambulatory Visit (INDEPENDENT_AMBULATORY_CARE_PROVIDER_SITE_OTHER): Payer: 59 | Admitting: Internal Medicine

## 2013-06-17 VITALS — BP 146/91 | HR 76 | Temp 98.0°F | Wt 212.0 lb

## 2013-06-17 DIAGNOSIS — I10 Essential (primary) hypertension: Secondary | ICD-10-CM

## 2013-06-17 DIAGNOSIS — G4700 Insomnia, unspecified: Secondary | ICD-10-CM | POA: Insufficient documentation

## 2013-06-17 MED ORDER — ZOLPIDEM TARTRATE 10 MG PO TABS: 10.0000 mg | ORAL_TABLET | Freq: Every evening | ORAL | Status: DC | PRN

## 2013-06-17 NOTE — Progress Notes (Signed)
   Subjective:    Patient ID: Narda BondsLorrie A Troia, female    DOB: 06-30-1965, 48 y.o.   MRN: [id number]  DOS:  06/17/2013 Type of  visit:  Acute visit One month history of difficulty sleeping, has a hard time falling asleep and sometimes wakes up at 2-3 AM. Admits to some stress related to family issues, no depression per se. "My mind keeps thinking when I go to bed". In the past has taken Ambien with good results. Currently taking Advil or Tylenol PM as needed. BP today slightly elevated, good compliance of medication. BP is checked at the hospital by the nurses and is always within range.     Past Medical History  Diagnosis Date  . Hypertension     dx age 48  . Menopause     LMP age 48  . Contraception     menopausal    Past Surgical History  Procedure Laterality Date  . Cholecystectomy    . Tonsillectomy      History   Social History  . Marital Status: Single    Spouse Name: N/A    Number of Children: 1  . Years of Education: N/A   Occupational History  . enviromental services  [employer]   Social History Main Topics  . Smoking status: Never Smoker   . Smokeless tobacco: Never Used  . Alcohol Use: Yes  . Drug Use: No     Comment: no marihuana ;lately   . Sexual Activity: Not on file   Other Topics Concern  . 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  Always use your most recent med list.               carvedilol 12.5 MG tablet  Commonly known as:  COREG  Take 1 tablet (12.5 mg total) by mouth 2 (two) times daily with a meal.     losartan-hydrochlorothiazide 100-12.5 MG per tablet  Commonly known as:  HYZAAR  TAKE 1 TABLET BY MOUTH DAILY.     potassium chloride 10 MEQ tablet  Commonly known as:  KLOR-CON 10  Take 1 tablet (10 mEq total) by mouth daily.     zolpidem 10 MG tablet  Commonly known as:   AMBIEN  Take 1 tablet (10 mg total) by mouth at bedtime as needed for sleep.           Objective:   Physical Exam BP 146/91  Pulse 76  Temp(Src) 98 F (36.7 C)  Wt 212 lb (96.163 kg)  SpO2 98% General -- alert, well-developed, NAD.   Psych-- Cognition and judgment appear intact. Cooperative with normal attention span and concentration. No anxious or depressed appearing.      Assessment & Plan:   Today , I spent more than  15 min with the patient, >50% of the time counseling

## 2013-06-17 NOTE — Assessment & Plan Note (Signed)
BP slightly elevated today but reports good medication compliance and normal ambulatory BPs. No change

## 2013-06-17 NOTE — Assessment & Plan Note (Signed)
One month history of difficulty sleeping, related to mild anxiety?. Patient is counseled regards  good sleep habits including no TV in her room and no late eating. Has tried before Ambien and he worked, prescription for Ambien provided. Reassess on return to the office.

## 2013-06-17 NOTE — Patient Instructions (Signed)
Take Ambien at bedtime as needed for difficulty sleeping    Next visit is for a physical exam within 2 months,  fasting Please make an appointment

## 2013-06-17 NOTE — Progress Notes (Signed)
Pre visit review using our clinic review tool, if applicable. No additional management support is needed unless otherwise documented below in the visit note. 

## 2013-06-18 ENCOUNTER — Telehealth: Payer: Self-pay | Admitting: Internal Medicine

## 2013-06-18 NOTE — Telephone Encounter (Signed)
Relevant patient education assigned to patient using Emmi. ° °

## 2013-07-16 ENCOUNTER — Other Ambulatory Visit: Payer: Self-pay | Admitting: Internal Medicine

## 2013-08-08 ENCOUNTER — Ambulatory Visit: Payer: 59 | Admitting: Internal Medicine

## 2013-08-18 ENCOUNTER — Other Ambulatory Visit: Payer: Self-pay | Admitting: Internal Medicine

## 2013-08-19 ENCOUNTER — Telehealth: Payer: Self-pay | Admitting: *Deleted

## 2013-08-19 MED ORDER — ZOLPIDEM TARTRATE 10 MG PO TABS: 10.0000 mg | ORAL_TABLET | Freq: Every evening | ORAL | Status: DC | PRN

## 2013-08-19 NOTE — Telephone Encounter (Signed)
Pt is calling in about the refill request.  Pt wants to know if we can get this completed today.

## 2013-08-19 NOTE — Telephone Encounter (Signed)
rx refill- ambien 10 mg  Last OV- 06/17/13 Last refilled - 06/17/13 #30 /1 rf  UDS- none

## 2013-08-19 NOTE — Telephone Encounter (Signed)
Prescription printed, she is due for a visit, please arrange

## 2013-08-19 NOTE — Telephone Encounter (Signed)
rx sent to [place] [facility]. lmovm to schedule office visit.

## 2013-10-13 ENCOUNTER — Other Ambulatory Visit: Payer: Self-pay | Admitting: Internal Medicine

## 2013-10-20 ENCOUNTER — Telehealth: Payer: Self-pay

## 2013-10-20 ENCOUNTER — Other Ambulatory Visit: Payer: Self-pay | Admitting: Internal Medicine

## 2013-10-20 MED ORDER — ZOLPIDEM TARTRATE 10 MG PO TABS: 10.0000 mg | ORAL_TABLET | Freq: Every evening | ORAL | Status: DC | PRN

## 2013-10-20 NOTE — Telephone Encounter (Signed)
Please call patient, we are refilling  Ambien but needs office visit before next refill, please arrange

## 2013-10-20 NOTE — Telephone Encounter (Signed)
Pt is requesting refill for Ambien.   Last OV: 06/17/2013 Last Fill: 08/19/2013 # 30 with 1 RF Last UDS: None   Please advise.

## 2013-10-20 NOTE — Telephone Encounter (Signed)
Medication faxed to Pharmacy.  

## 2013-12-17 ENCOUNTER — Emergency Department (HOSPITAL_COMMUNITY)
Admission: EM | Admit: 2013-12-17 | Discharge: 2013-12-17 | Disposition: A | Discharge status: Home / Self Care | Payer: 59 | Attending: Emergency Medicine | Admitting: Emergency Medicine

## 2013-12-17 ENCOUNTER — Emergency Department (HOSPITAL_COMMUNITY): Payer: 59

## 2013-12-17 ENCOUNTER — Encounter (HOSPITAL_COMMUNITY): Payer: Self-pay | Admitting: Emergency Medicine

## 2013-12-17 DIAGNOSIS — G8929 Other chronic pain: Secondary | ICD-10-CM | POA: Diagnosis not present

## 2013-12-17 DIAGNOSIS — Z79899 Other long term (current) drug therapy: Secondary | ICD-10-CM | POA: Diagnosis not present

## 2013-12-17 DIAGNOSIS — Z87828 Personal history of other (healed) physical injury and trauma: Secondary | ICD-10-CM | POA: Diagnosis not present

## 2013-12-17 DIAGNOSIS — M1711 Unilateral primary osteoarthritis, right knee: Secondary | ICD-10-CM | POA: Diagnosis not present

## 2013-12-17 DIAGNOSIS — M2341 Loose body in knee, right knee: Secondary | ICD-10-CM | POA: Insufficient documentation

## 2013-12-17 DIAGNOSIS — M25561 Pain in right knee: Secondary | ICD-10-CM | POA: Diagnosis not present

## 2013-12-17 DIAGNOSIS — I159 Secondary hypertension, unspecified: Secondary | ICD-10-CM | POA: Insufficient documentation

## 2013-12-17 DIAGNOSIS — M25569 Pain in unspecified knee: Secondary | ICD-10-CM

## 2013-12-17 HISTORY — DX: Pain in unspecified knee: M25.569

## 2013-12-17 HISTORY — DX: Other chronic pain: G89.29

## 2013-12-17 MED ORDER — LOSARTAN POTASSIUM-HCTZ 100-12.5 MG PO TABS: ORAL_TABLET | ORAL | Status: DC

## 2013-12-17 MED ORDER — NAPROXEN 500 MG PO TABS
500.0000 mg | ORAL_TABLET | Freq: Two times a day (BID) | ORAL | Status: DC
Start: 2013-12-17 — End: 2014-04-14

## 2013-12-17 MED ORDER — CARVEDILOL 12.5 MG PO TABS: 12.5000 mg | ORAL_TABLET | Freq: Two times a day (BID) | ORAL | Status: DC

## 2013-12-17 NOTE — ED Notes (Signed)
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  C/o rt knee pain.  Chronic problem but states that it has gotten unbearable.  Denies injury.

## 2013-12-17 NOTE — ED Provider Notes (Signed)
CSN: [id number]     Arrival date & time 12/17/13  [id number] History   First MD Initiated Contact with Patient 12/17/13 [phone]     Chief Complaint  Patient presents with  . Knee Pain     (Consider location/radiation/quality/duration/timing/severity/associated sxs/prior Treatment) HPI  Patient to the ER with complaints of right knee pain. 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 SHe reports having family hx of severe arthritis in the legs. She remembers falling one year ago and injuring this knee but never got it looked at.  Over the past 3 weeks the patient reports having knee locking, clicking, giving out on her. She reports having 2 jobs that requires her to stand be on her feet all day. She denies that the pain is constant. She reports it mainly hurts after having an episode of clicking, locking, giving out. He denies that her knee appears swollen, feels warm or has change of color. She otherwise feels well. It is noted that her BP is elevated in the ED today. Has not been taking her meds as prescribed.  Past Medical History  Diagnosis Date  . Hypertension     dx age 48  . Menopause     LMP age 48  . Contraception     menopausal  . Chronic knee pain    Past Surgical History  Procedure Laterality Date  . Cholecystectomy    . Tonsillectomy     Family History  Problem Relation Age of Onset  . Coronary artery disease Neg Hx   . Stroke Neg Hx   . Colon cancer Neg Hx   . Breast cancer Neg Hx   . Hypertension Mother   . Hypertension Father   . Hypertension      GP  . Diabetes Mother   . Diabetes Father     GP   History  Substance Use Topics  . Smoking status: Never Smoker   . Smokeless tobacco: Never Used  . Alcohol Use: Yes   OB History   Grav Para Term Preterm Abortions TAB SAB Ect Mult Living                 Review of Systems 10 Systems reviewed and are negative for acute change except as noted in the HPI.    Allergies  Codeine  Home  Medications   Prior to Admission medications   Medication Sig Start Date End Date Taking? Authorizing Provider  carvedilol (COREG) 12.5 MG tablet Take 1 tablet (12.5 mg total) by mouth 2 (two) times daily with a meal. 01/14/13   Wanda PlumpJose E Paz, MD  losartan-hydrochlorothiazide (HYZAAR) 100-12.5 MG per tablet TAKE 1 TABLET BY MOUTH DAILY. 10/13/13   Wanda PlumpJose E Paz, MD  potassium chloride (K-DUR,KLOR-CON) 10 MEQ tablet TAKE 1 TABLET BY MOUTH ONCE DAILY 07/16/13   Wanda PlumpJose E Paz, MD  zolpidem (AMBIEN) 10 MG tablet Take 1 tablet (10 mg total) by mouth at bedtime as needed for sleep. 10/20/13 11/19/13  Wanda PlumpJose E Paz, MD   BP 167/113  Pulse 86  Temp(Src) 98.4 F (36.9 C) (Oral)  Resp 18  SpO2 100% Physical Exam  Nursing note and vitals reviewed. Constitutional: She appears well-developed and well-nourished. No distress.  HENT:  Head: Normocephalic and atraumatic.  Eyes: Pupils are equal, round, and reactive to light.  Neck: Normal range of motion. Neck supple.  Cardiovascular: Normal rate and regular rhythm.   Pulmonary/Chest: Effort normal.  Abdominal: Soft.  Musculoskeletal:  Right knee: She exhibits normal range of motion, no swelling, no effusion, no ecchymosis, no deformity, no laceration, no erythema, normal alignment, no LCL laxity, normal patellar mobility, no bony tenderness, normal meniscus and no MCL laxity. No tenderness found. No patellar tendon tenderness noted.  Neurological: She is alert.  Skin: Skin is warm and dry.    ED Course  Procedures (including critical care time) Labs Review Labs Reviewed - No data to display  Imaging Review Dg Knee Complete 4 Views Right  12/17/2013   CLINICAL DATA:  Progressive knee pain, worse when standing. No known history of trauma.  EXAM: RIGHT KNEE - COMPLETE 4+ VIEW  COMPARISON:  None.  FINDINGS: Frontal, lateral, and bilateral oblique views were obtained. There is no fracture, dislocation, or effusion. There is mild narrowing medially and in the  patellofemoral joint region. There is spurring medially and in the patellofemoral joint region. A small loose body is noted in the anterior aspect of the knee joint medially. No erosive change.  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 Small loose body noted anterior medial aspect of the joint. No fracture or effusion. No erosive change.   Electronically Signed   By: Bretta BangWilliam  Woodruff M.D.   On: 12/17/2013 09:17     EKG Interpretation None      MDM   Final diagnoses:  Knee pain    Pt has loose body and arthritis to knee. The knee is not erythematous, indurated or swollen.  Knee sleeve given in ED. Pt has cruthces at home. Given work note and referral to Ortho- explained the importance of ortho f/u in regards to her symptoms. Her BP is elevated, will Rx her BP medications.   48 y.o.Sara Norris's evaluation in the Emergency Department is complete. It has been determined that no acute conditions requiring further emergency intervention are present at this time. The patient/guardian have been advised of the diagnosis and plan. We have discussed signs and symptoms that warrant return to the ED, such as changes or worsening in symptoms.  Vital signs are stable at discharge. Filed Vitals:   12/17/13 0848  BP: 167/113  Pulse: 86  Temp: 98.4 F (36.9 C)  Resp: 18    Patient/guardian has voiced understanding and agreed to follow-up with the PCP or specialist.    Dorthula Matasiffany G Lyric Hoar, PA-C 12/17/13 [phone]

## 2013-12-17 NOTE — Discharge Instructions (Signed)

## 2013-12-18 NOTE — ED Provider Notes (Signed)
Medical screening examination/treatment/procedure(s) were performed by non-physician practitioner and as supervising physician I was immediately available for consultation/collaboration.   EKG Interpretation None        Kirubel Aja, DO 12/18/13 0745 

## 2013-12-29 ENCOUNTER — Other Ambulatory Visit: Payer: Self-pay | Admitting: Internal Medicine

## 2014-01-06 ENCOUNTER — Other Ambulatory Visit: Payer: Self-pay | Admitting: Internal Medicine

## 2014-01-21 ENCOUNTER — Other Ambulatory Visit: Payer: Self-pay

## 2014-01-27 ENCOUNTER — Ambulatory Visit: Payer: 59 | Admitting: Internal Medicine

## 2014-03-16 ENCOUNTER — Other Ambulatory Visit: Payer: Self-pay | Admitting: Internal Medicine

## 2014-04-02 ENCOUNTER — Telehealth: Payer: Self-pay | Admitting: *Deleted

## 2014-04-02 NOTE — Telephone Encounter (Signed)
Received FMLA paperwork via fax from [employer] for hypertension. Filled out as much as possible and forwarded to Dr. Drue NovelPaz. Pt has an appt on 04/07/14 w/ Dr. Drue NovelPaz. JG//CMA

## 2014-04-06 ENCOUNTER — Other Ambulatory Visit: Payer: Self-pay

## 2014-04-07 ENCOUNTER — Ambulatory Visit: Payer: 59 | Admitting: Internal Medicine

## 2014-04-14 ENCOUNTER — Encounter: Payer: Self-pay | Admitting: Internal Medicine

## 2014-04-14 ENCOUNTER — Ambulatory Visit (INDEPENDENT_AMBULATORY_CARE_PROVIDER_SITE_OTHER): Payer: 59 | Admitting: Internal Medicine

## 2014-04-14 VITALS — BP 174/111 | HR 77 | Temp 98.4°F | Ht 68.0 in | Wt 203.5 lb

## 2014-04-14 DIAGNOSIS — Z0000 Encounter for general adult medical examination without abnormal findings: Secondary | ICD-10-CM

## 2014-04-14 DIAGNOSIS — I10 Essential (primary) hypertension: Secondary | ICD-10-CM

## 2014-04-14 LAB — CBC WITH DIFFERENTIAL/PLATELET
BASOS ABS: 0.1 10*3/uL (ref 0.0–0.1)
BASOS PCT: 1 % (ref 0.0–3.0)
EOS ABS: 0.2 10*3/uL (ref 0.0–0.7)
Eosinophils Relative: 4.2 % (ref 0.0–5.0)
HCT: 38.9 % (ref 36.0–46.0)
Hemoglobin: 13.1 g/dL (ref 12.0–15.0)
LYMPHS ABS: 2.2 10*3/uL (ref 0.7–4.0)
Lymphocytes Relative: 44.6 % (ref 12.0–46.0)
MCHC: 33.5 g/dL (ref 30.0–36.0)
MCV: 89.7 fl (ref 78.0–100.0)
Monocytes Absolute: 0.4 10*3/uL (ref 0.1–1.0)
Monocytes Relative: 7.2 % (ref 3.0–12.0)
NEUTROS ABS: 2.1 10*3/uL (ref 1.4–7.7)
Neutrophils Relative %: 43 % (ref 43.0–77.0)
PLATELETS: 367 10*3/uL (ref 150.0–400.0)
RBC: 4.34 Mil/uL (ref 3.87–5.11)
RDW: 14 % (ref 11.5–15.5)
WBC: 4.9 10*3/uL (ref 4.0–10.5)

## 2014-04-14 LAB — COMPREHENSIVE METABOLIC PANEL
ALBUMIN: 4.4 g/dL (ref 3.5–5.2)
ALT: 16 U/L (ref 0–35)
AST: 18 U/L (ref 0–37)
Alkaline Phosphatase: 79 U/L (ref 39–117)
BUN: 17 mg/dL (ref 6–23)
CALCIUM: 9.8 mg/dL (ref 8.4–10.5)
CHLORIDE: 104 meq/L (ref 96–112)
CO2: 32 meq/L (ref 19–32)
Creatinine, Ser: 0.79 mg/dL (ref 0.40–1.20)
GFR: 99.66 mL/min (ref >60.00)
Glucose, Bld: 98 mg/dL (ref 70–99)
Potassium: 4.1 mEq/L (ref 3.5–5.1)
Sodium: 139 mEq/L (ref 135–145)
TOTAL PROTEIN: 7.7 g/dL (ref 6.0–8.3)
Total Bilirubin: 0.4 mg/dL (ref 0.2–1.2)

## 2014-04-14 MED ORDER — LOSARTAN POTASSIUM-HCTZ 100-12.5 MG PO TABS: ORAL_TABLET | ORAL | Status: DC

## 2014-04-14 MED ORDER — ZOLPIDEM TARTRATE 10 MG PO TABS: 10.0000 mg | ORAL_TABLET | Freq: Every evening | ORAL | Status: DC | PRN

## 2014-04-14 MED ORDER — CARVEDILOL 6.25 MG PO TABS: ORAL_TABLET | ORAL | Status: DC

## 2014-04-14 MED ORDER — POTASSIUM CHLORIDE CRYS ER 10 MEQ PO TBCR: EXTENDED_RELEASE_TABLET | ORAL | Status: DC

## 2014-04-14 NOTE — Progress Notes (Signed)
Pre visit review using our clinic review tool, if applicable. No additional management support is needed unless otherwise documented below in the visit note. 

## 2014-04-14 NOTE — Progress Notes (Signed)
   Subjective:    Patient ID: Sara Norris, female    DOB: 1965-02-22, 49 y.o.   MRN: [id number]  DOS:  04/14/2014 Type of visit - description : rov Interval history: Hypertension, good compliance when Hyzaar and potassium, ran out of Coreg about 2 weeks ago. BP today elevated, when she was taking Coreg BP was "always okay". Insomnia, needs Ambien refills Needs paperwork, FMLA completed today   Review of Systems Denies chest pain or difficulty breathing. No nausea, vomiting, diarrhea  Past Medical History  Diagnosis Date  . Hypertension     dx age 49  . Menopause     LMP age 49  . Contraception     menopausal  . Chronic knee pain     Past Surgical History  Procedure Laterality Date  . Cholecystectomy    . Tonsillectomy      History   Social History  . Marital Status: Single    Spouse Name: N/A  . Number of Children: 1  . Years of Education: N/A   Occupational History  . enviromental services  [employer]   Social History Main Topics  . Smoking status: Never Smoker   . Smokeless tobacco: Never Used  . Alcohol Use: Yes  . Drug Use: No     Comment: no marihuana ;lately   . Sexual Activity: Not on file   Other Topics Concern  . 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  Always use your most recent med list.               aspirin 81 MG tablet  Take 81 mg by mouth daily.     carvedilol 6.25 MG tablet  Commonly known as:  COREG  Take 1 tablet by mouth twice daily.     losartan-hydrochlorothiazide 100-12.5 MG per tablet  Commonly known as:  HYZAAR  TAKE 1 TABLET BY MOUTH DAILY.     potassium chloride 10 MEQ tablet  Commonly known as:  K-DUR,KLOR-CON  Take 1 tablet daily.     zolpidem 10 MG tablet  Commonly known as:  AMBIEN  Take 1 tablet (10 mg total) by mouth at bedtime as needed for sleep.           Objective:   Physical Exam BP 174/111 mmHg  Pulse 77  Temp(Src) 98.4 F (36.9 C) (Oral)  Ht 5\' 8"  (1.727 m)  Wt 203 lb 8 oz (92.307 kg)  BMI 30.95 kg/m2  SpO2 97% General:   Well developed, well nourished . NAD.  HEENT:  Normocephalic . Face symmetric, atraumatic. No thyromegaly Lungs:  CTA B Normal respiratory effort, no intercostal retractions, no accessory muscle use. Heart: RRR,  no murmur.  Muscle skeletal: no pretibial edema bilaterally  Skin: Not pale. Not jaundice Neurologic:  alert & oriented X3.  Speech normal, gait appropriate for age and unassisted Psych--  Cognition and judgment appear intact.  Cooperative with normal attention span and concentration.  Behavior appropriate. No anxious or depressed appearing.       Assessment & Plan:

## 2014-04-14 NOTE — Assessment & Plan Note (Signed)
Encourage to see gynecology Return to the office 4 months for a physical

## 2014-04-14 NOTE — Assessment & Plan Note (Signed)
BP was well-controlled when she was taking all medications as prescribed. Plan: Refill all meds Labs Low-salt diet encourage Monitor BPs, see instructions

## 2014-04-14 NOTE — Patient Instructions (Signed)
Get your blood work before you leave   Check the  blood pressure 2 or 3 times a month  Be sure your blood pressure is between 110/65 and  145/85.  if it is consistently higher or lower, let me know   See your gynecologist  Come back in 4 months  for a  office visit physical exam   Come back fasting

## 2014-09-06 ENCOUNTER — Emergency Department (HOSPITAL_COMMUNITY)
Admission: EM | Admit: 2014-09-06 | Discharge: 2014-09-06 | Disposition: A | Discharge status: Home / Self Care | Payer: 59 | Attending: Emergency Medicine | Admitting: Emergency Medicine

## 2014-09-06 ENCOUNTER — Encounter (HOSPITAL_COMMUNITY): Payer: Self-pay | Admitting: Emergency Medicine

## 2014-09-06 DIAGNOSIS — X58XXXA Exposure to other specified factors, initial encounter: Secondary | ICD-10-CM | POA: Insufficient documentation

## 2014-09-06 DIAGNOSIS — R21 Rash and other nonspecific skin eruption: Secondary | ICD-10-CM | POA: Diagnosis present

## 2014-09-06 DIAGNOSIS — G8929 Other chronic pain: Secondary | ICD-10-CM | POA: Diagnosis not present

## 2014-09-06 DIAGNOSIS — Z79899 Other long term (current) drug therapy: Secondary | ICD-10-CM | POA: Diagnosis not present

## 2014-09-06 DIAGNOSIS — Y9389 Activity, other specified: Secondary | ICD-10-CM | POA: Insufficient documentation

## 2014-09-06 DIAGNOSIS — Y9289 Other specified places as the place of occurrence of the external cause: Secondary | ICD-10-CM | POA: Diagnosis not present

## 2014-09-06 DIAGNOSIS — I10 Essential (primary) hypertension: Secondary | ICD-10-CM | POA: Diagnosis not present

## 2014-09-06 DIAGNOSIS — Z780 Asymptomatic menopausal state: Secondary | ICD-10-CM | POA: Diagnosis not present

## 2014-09-06 DIAGNOSIS — T7840XA Allergy, unspecified, initial encounter: Secondary | ICD-10-CM | POA: Diagnosis not present

## 2014-09-06 DIAGNOSIS — Y998 Other external cause status: Secondary | ICD-10-CM | POA: Insufficient documentation

## 2014-09-06 DIAGNOSIS — Z7982 Long term (current) use of aspirin: Secondary | ICD-10-CM | POA: Diagnosis not present

## 2014-09-06 MED ORDER — PREDNISONE 20 MG PO TABS
60.0000 mg | ORAL_TABLET | Freq: Once | ORAL | Status: AC
  Administered 2014-09-06: 60 mg via ORAL
  Filled 2014-09-06: qty 3

## 2014-09-06 MED ORDER — RANITIDINE HCL 150 MG/10ML PO SYRP
300.0000 mg | ORAL_SOLUTION | Freq: Once | ORAL | Status: AC
  Administered 2014-09-06: 300 mg via ORAL
  Filled 2014-09-06: qty 20

## 2014-09-06 MED ORDER — EPINEPHRINE 0.3 MG/0.3ML IJ SOAJ
0.3000 mg | Freq: Once | INTRAMUSCULAR | Status: AC
  Administered 2014-09-06: 0.3 mg via INTRAMUSCULAR
  Filled 2014-09-06: qty 0.3

## 2014-09-06 MED ORDER — EPINEPHRINE 0.3 MG/0.3ML IJ SOAJ: 0.3000 mg | Freq: Once | INTRAMUSCULAR | Status: DC

## 2014-09-06 MED ORDER — PREDNISONE 20 MG PO TABS: 60.0000 mg | ORAL_TABLET | Freq: Every day | ORAL | Status: DC

## 2014-09-06 NOTE — ED Provider Notes (Signed)
CSN: [id number]     Arrival date & time 09/06/14  0002 History   First MD Initiated Contact with Patient 09/06/14 0057     Chief Complaint  Patient presents with  . Allergic Reaction     (Consider location/radiation/quality/duration/timing/severity/associated sxs/prior Treatment) HPI  Sara Norris is a 49 y.o. female with past medical history of hypertension presents today with an acute allergic reaction. Patient is unsure what caused this. She had a roast beef sandwich from [employer] this morning at work. She subsequently saw a small bump on her right lower extremity. Patient also [name] a new oil on her body today. She subsequently had a second roast beef sandwich from some way this evening for dinner, after which she developed diffuse rash over her back and arms and abdomen. It is pruritic as well. Patient took 50 mg of Benadryl for treatment which did not help. She presents today for worsening allergic reaction. She has no further complaints.  10 Systems reviewed and are negative for acute change except as noted in the HPI.    Past Medical History  Diagnosis Date  . Hypertension     dx age 49  . Menopause     LMP age 49  . Contraception     menopausal  . Chronic knee pain    Past Surgical History  Procedure Laterality Date  . Cholecystectomy    . Tonsillectomy     Family History  Problem Relation Age of Onset  . Coronary artery disease Neg Hx   . Stroke Neg Hx   . Colon cancer Neg Hx   . Breast cancer Neg Hx   . Hypertension Mother   . Hypertension Father   . Hypertension      GP  . Diabetes Mother   . Diabetes Father     GP   History  Substance Use Topics  . Smoking status: Never Smoker   . Smokeless tobacco: Never Used  . Alcohol Use: Yes   OB History    No data available     Review of Systems    Allergies  Codeine  Home Medications   Prior to Admission medications   Medication Sig Start Date End Date Taking? Authorizing Provider  aspirin 81 MG  tablet Take 81 mg by mouth daily.    Historical Provider, MD  carvedilol (COREG) 6.25 MG tablet Take 1 tablet by mouth twice daily. 04/14/14   Wanda PlumpJose E Paz, MD  losartan-hydrochlorothiazide (HYZAAR) 100-12.5 MG per tablet TAKE 1 TABLET BY MOUTH DAILY. 04/14/14   Wanda PlumpJose E Paz, MD  potassium chloride (K-DUR,KLOR-CON) 10 MEQ tablet Take 1 tablet daily. 04/14/14   Wanda PlumpJose E Paz, MD  zolpidem (AMBIEN) 10 MG tablet Take 1 tablet (10 mg total) by mouth at bedtime as needed for sleep. 04/14/14 10/07/14  Wanda PlumpJose E Paz, MD   BP 158/82 mmHg  Pulse 88  Temp(Src) 98.1 F (36.7 C) (Oral)  Resp 18  Ht 5\' 8"  (1.727 m)  Wt 201 lb (91.173 kg)  BMI 30.57 kg/m2  SpO2 100% Physical Exam  Constitutional: She is oriented to person, place, and time. She appears well-developed and well-nourished. No distress.  HENT:  Head: Normocephalic and atraumatic.  Nose: Nose normal.  Mouth/Throat: Oropharynx is clear and moist. No oropharyngeal exudate.  Eyes: Conjunctivae and EOM are normal. Pupils are equal, round, and reactive to light. No scleral icterus.  Neck: Normal range of motion. Neck supple. No JVD present. No tracheal deviation present. No thyromegaly present.  Cardiovascular:  Normal rate, regular rhythm and normal heart sounds.  Exam reveals no gallop and no friction rub.   No murmur heard. Pulmonary/Chest: Effort normal and breath sounds normal. No respiratory distress. She has no wheezes. She exhibits no tenderness.  Abdominal: Soft. Bowel sounds are normal. She exhibits no distension and no mass. There is no tenderness. There is no rebound and no guarding.  Musculoskeletal: Normal range of motion. She exhibits no edema or tenderness.  Lymphadenopathy:    She has no cervical adenopathy.  Neurological: She is alert and oriented to person, place, and time. No cranial nerve deficit. She exhibits normal muscle tone.  Skin: Skin is warm and dry. Rash noted. She is not diaphoretic. There is erythema. No pallor.  Urticarial  rash seen in the left flank, right posterior neck, bilateral upper extremities.  Nursing note and vitals reviewed.   ED Course  Procedures (including critical care time) Labs Review Labs Reviewed - No data to display  Imaging Review No results found.   EKG Interpretation None      MDM   Final diagnoses:  None   patient presents emergency department for an acute allergic reaction. She already: take Benadryl approximately 3 hours ago at home, 50 mg. Patient will be given EpiPen, prednisone, Zantac and emergency father. Will observe for 2 hours and reassess for possible discharge.  Her rash has significantly improved.  She states the itching has resolved as well.  Will DC with 4 more days of prednisone and provide epipen to use at home if needed.  PCP fu advised within  3 days.  VS remain stable and she is safe for DC.  Tomasita Crumble, MD 09/06/14 1355

## 2014-09-06 NOTE — Discharge Instructions (Signed)
Anaphylactic Reaction Ms. Gotschall, take prednisone for the next 4 days and see yourr primary care physician within 3 days for close follow-up. If you have another allergic reaction, use the EpiPen and come to the emergency department immediately.Thank you. An anaphylactic reaction is a sudden, severe allergic reaction. It affects the whole body. It can be life threatening. You may need to stay in the hospital.  [place] a medical bracelet or necklace that lists your allergy.  Carry your allergy kit or medicine shot to treat severe allergic reactions with you. These can save your life.  Do not drive until medicine from your shot has worn off, unless your doctor says it is okay.  If you have hives or a rash:  Take medicine as told by your doctor.  You may take over-the-counter antihistamine medicine.  Place cold cloths on your skin. Take baths in cool water. Avoid hot baths and hot showers. GET HELP RIGHT AWAY IF:   Your mouth is puffy (swollen), or you have trouble breathing.  You start making whistling sounds when you breathe (wheezing).  You have a tight feeling in your chest or throat.  You have a rash, hives, puffiness, or itching on your body.  You throw up (vomit) or have watery poop (diarrhea).  You feel dizzy or pass out (faint).  You think you are having an allergic reaction.  You have new symptoms. This is an emergency. Use your medicine shot or allergy kit as told. Call your local emergency services (911 in [place] Even if you feel better after the shot, you need to go to the hospital emergency department. MAKE SURE YOU:   Understand these instructions.  Will watch your condition.  Will get help right away if you are not doing well or get worse. Document Released: 07/26/2007 Document Revised: 08/08/2011 Document Reviewed: 05/10/2011 [facility] Patient Information 2015 [place], [place]. This information is not intended to replace advice given to you by your  health care provider. Make sure you discuss any questions you have with your health care provider.

## 2014-09-06 NOTE — ED Notes (Addendum)
Pt arrived to the ED with a complaint of an allergic reaction.  Pt began having itching around noon yesterday.  Pt states the itching red spots have increased over time.  Pt states that the symptoms started earlier in the day.  Pt has taken 50 mg of benadryl prior to arrival.

## 2014-09-21 ENCOUNTER — Ambulatory Visit: Payer: 59 | Admitting: Internal Medicine

## 2014-09-21 ENCOUNTER — Telehealth: Payer: Self-pay | Admitting: Internal Medicine

## 2014-09-24 NOTE — Telephone Encounter (Signed)
no

## 2014-09-24 NOTE — Telephone Encounter (Signed)
Pt was no show 09/21/14 2:30pm for ED follow up for allergic reaction, I called pt to reschedule and she said it hasn't happened anymore and she thinks it was from something she ate, did not want to reschedule, charge for no show?

## 2015-01-01 ENCOUNTER — Other Ambulatory Visit: Payer: Self-pay | Admitting: Internal Medicine

## 2015-01-01 NOTE — Telephone Encounter (Signed)
Rx faxed to [facility]. Spoke with Pt, follow up appt scheduled for 01/07/2015 at 0845.

## 2015-01-01 NOTE — Telephone Encounter (Signed)
Okay #30, no refills. She is due for a f/u, please call and set up

## 2015-01-01 NOTE — Telephone Encounter (Signed)
Rx printed, awaiting MD signature.  

## 2015-01-01 NOTE — Telephone Encounter (Signed)
Pt is requesting refill on Ambien.  Last OV: 04/14/2014, no future appt scheduled Last Fill: 04/14/2014 #30 and 2RF UDS: Not needed   Please advise.

## 2015-01-07 ENCOUNTER — Ambulatory Visit: Payer: 59 | Admitting: Internal Medicine

## 2015-01-13 ENCOUNTER — Encounter: Payer: Self-pay | Admitting: Internal Medicine

## 2015-01-13 ENCOUNTER — Ambulatory Visit (INDEPENDENT_AMBULATORY_CARE_PROVIDER_SITE_OTHER): Payer: 59 | Admitting: Internal Medicine

## 2015-01-13 VITALS — BP 124/70 | HR 78 | Temp 98.1°F | Ht 68.0 in | Wt 206.0 lb

## 2015-01-13 DIAGNOSIS — Z01419 Encounter for gynecological examination (general) (routine) without abnormal findings: Secondary | ICD-10-CM

## 2015-01-13 DIAGNOSIS — Z0182 Encounter for allergy testing: Secondary | ICD-10-CM

## 2015-01-13 DIAGNOSIS — Z09 Encounter for follow-up examination after completed treatment for conditions other than malignant neoplasm: Secondary | ICD-10-CM

## 2015-01-13 DIAGNOSIS — I10 Essential (primary) hypertension: Secondary | ICD-10-CM | POA: Diagnosis not present

## 2015-01-13 DIAGNOSIS — R7989 Other specified abnormal findings of blood chemistry: Secondary | ICD-10-CM

## 2015-01-13 DIAGNOSIS — R748 Abnormal levels of other serum enzymes: Secondary | ICD-10-CM

## 2015-01-13 DIAGNOSIS — Z91018 Allergy to other foods: Secondary | ICD-10-CM | POA: Diagnosis not present

## 2015-01-13 DIAGNOSIS — Z114 Encounter for screening for human immunodeficiency virus [HIV]: Secondary | ICD-10-CM

## 2015-01-13 LAB — BASIC METABOLIC PANEL
BUN: 21 mg/dL (ref 7–25)
CALCIUM: 9.5 mg/dL (ref 8.6–10.2)
CO2: 28 mmol/L (ref 20–31)
Chloride: 103 mmol/L (ref 98–110)
Creat: 1.13 mg/dL — ABNORMAL HIGH (ref 0.50–1.10)
Glucose, Bld: 85 mg/dL (ref 65–99)
POTASSIUM: 3.8 mmol/L (ref 3.5–5.3)
SODIUM: 141 mmol/L (ref 135–146)

## 2015-01-13 LAB — HEMOGLOBIN A1C
Hgb A1c MFr Bld: 6.1 % — ABNORMAL HIGH (ref <5.7)
MEAN PLASMA GLUCOSE: 128 mg/dL — AB (ref <117)

## 2015-01-13 MED ORDER — ZOLPIDEM TARTRATE 10 MG PO TABS: 10.0000 mg | ORAL_TABLET | Freq: Every evening | ORAL | Status: DC | PRN

## 2015-01-13 NOTE — Patient Instructions (Addendum)
Get your blood work before you leave    Check the  blood pressure 2 or 3 times a month    Be sure your blood pressure is between 110/65 and  145/85.  if it is consistently higher or lower, let me know   Next visit  for a  PHYSICAL EXAM in 6-8 months, fasting   Please schedule an appointment at the front desk

## 2015-01-13 NOTE — Progress Notes (Signed)
Pre visit review using our clinic review tool, if applicable. No additional management support is needed unless otherwise documented below in the visit note. 

## 2015-01-13 NOTE — Progress Notes (Signed)
Subjective:    Patient ID: Sara BondsLorrie A Pergola, female    DOB: 1965/07/28, 49 y.o.   MRN: [id number]  DOS:  01/13/2015 Type of visit - description : Routine visit  Interval history:  HTN: Good compliance of medication, ambulatory BPs within normal 4 months ago went to the ER shortly after he was eating at a [employer] and developed a rash. At the ER she got a EpiPen injection, prednisone and shortly after she felt better. Did not have lip or tongue swelling, no shortness of breath. She was prescribed EpiPen but has not filled it. No further events. She request a referral to a allergies, suspects a food allergy: in addition to above, she is noticing Itchy hands when she cuts tomatoes, occasionally itchy hands when she drinks lemonade.  Review of Systems Denies chest pain or difficulty breathing No nausea, vomiting, diarrhea  Past Medical History  Diagnosis Date  . Hypertension     dx age 49  . Menopause     LMP age 49  . Contraception     menopausal  . Chronic knee pain     Past Surgical History  Procedure Laterality Date  . Cholecystectomy    . Tonsillectomy      Social History   Social History  . Marital Status: Single    Spouse Name: N/A  . Number of Children: 1  . Years of Education: N/A   Occupational History  . enviromental services  [employer]   Social History Main Topics  . Smoking status: Never Smoker   . Smokeless tobacco: Never Used  . Alcohol Use: Yes  . Drug Use: No     Comment: no marihuana ;lately   . Sexual Activity: Not on file   Other Topics Concern  . 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  Always use your most recent med list.               aspirin 81 MG tablet  Take 81 mg by mouth daily.     carvedilol 6.25 MG tablet  Commonly known as:  COREG  Take 1 tablet by mouth twice daily.     diphenhydrAMINE 50 MG capsule  Commonly known as:  BENADRYL  Take 50 mg by mouth every 8 (eight) hours as needed for allergies.     EPINEPHrine 0.3 mg/0.3 mL Soaj injection  Commonly known as:  EPI-PEN  Inject 0.3 mLs (0.3 mg total) into the muscle once.     losartan-hydrochlorothiazide 100-12.5 MG tablet  Commonly known as:  HYZAAR  TAKE 1 TABLET BY MOUTH DAILY.     potassium chloride 10 MEQ tablet  Commonly known as:  K-DUR,KLOR-CON  Take 1 tablet daily.     zolpidem 10 MG tablet  Commonly known as:  AMBIEN  Take 1 tablet (10 mg total) by mouth at bedtime as needed for sleep.           Objective:   Physical Exam BP 124/70 mmHg  Pulse 78  Temp(Src) 98.1 F (36.7 C) (Oral)  Ht 5\' 8"  (1.727 m)  Wt 206 lb (93.441 kg)  BMI 31.33 kg/m2  SpO2 99% General:   Well developed, well nourished . NAD.  HEENT:  Normocephalic . Face symmetric, atraumatic Lungs:  CTA B Normal respiratory effort, no intercostal retractions, no accessory muscle use. Heart: RRR,  no murmur.  No pretibial edema bilaterally  Skin: Not pale. Not jaundice Neurologic:  alert & oriented X3.  Speech normal, gait appropriate for age and unassisted Psych--  Cognition and judgment appear intact.  Cooperative with normal attention span and concentration.  Behavior appropriate. No anxious or depressed appearing.      Assessment & Plan:   Assessment > HTN DX age 61 Insomnia - on ambien Early menopause - declines HRT H/o Knee pain  Plan: HTN: Seems well-controlled, refill medications, check a BMP. Also check a A1c, blood sugar was slightly elevated before. Insomnia: Ambien, well-controlled, refill as needed Food Allergies? See history of present illness, had a allergic reaction  that required ER visit. Will refer to on allergies and also encouraged her to get her own EpiPen. Menopausal, has hot flashes but she made her mind that she does not like HRT. Primary care: refer to gynecology, screen  for HIV

## 2015-01-14 LAB — HIV ANTIBODY (ROUTINE TESTING W REFLEX): HIV 1&2 Ab, 4th Generation: NONREACTIVE

## 2015-01-16 DIAGNOSIS — Z09 Encounter for follow-up examination after completed treatment for conditions other than malignant neoplasm: Secondary | ICD-10-CM | POA: Insufficient documentation

## 2015-01-16 NOTE — Assessment & Plan Note (Signed)
HTN: Seems well-controlled, refill medications, check a BMP. Also check a A1c, blood sugar was slightly elevated before. Insomnia: Ambien, well-controlled, refill as needed Food Allergies? See history of present illness, had a allergic reaction  that required ER visit. Will refer to on allergies and also encouraged her to get her own EpiPen. Menopausal, has hot flashes but she made her mind that she does not like HRT. Primary care: refer to gynecology, screen for HIV

## 2015-01-19 ENCOUNTER — Encounter: Payer: Self-pay | Admitting: Internal Medicine

## 2015-01-19 NOTE — Addendum Note (Signed)
Addended by: Dorette GrateFAULKNER, Shamell Suarez C on: 01/19/2015 12:01 PM   Modules accepted: Orders

## 2015-01-27 ENCOUNTER — Telehealth: Payer: Self-pay | Admitting: Internal Medicine

## 2015-01-27 NOTE — Telephone Encounter (Signed)
Please schedule patient in next opening for allergy consult; if not soon enough for patient please suggest she an go through [facility]. Thanks.

## 2015-01-27 NOTE — Telephone Encounter (Signed)
Ok to schedule.

## 2015-01-27 NOTE — Telephone Encounter (Signed)
Left message for pt to call back to discuss

## 2015-01-27 NOTE — Telephone Encounter (Signed)
Patient says that she has eaten something in the past that caused her to break out in a rash that required an EpiPen.  Patient says that she is not having an acute issue at this moment, she is just wanting to get tested to see what caused her to break out that time.  She says that she thinks she may have an allergy to citrus, tomatoes, or different foods, wants to be sure. She would like appointment for Allergy testing.    Katie - where can we put patient on CY's schedule for Allergy Consult? Please advise.

## 2015-01-28 NOTE — Telephone Encounter (Signed)
Spoke with pt. She is fine with waiting until CY's next available. Consult has been scheduled on 05/13/15 at 3pm. Nothing further was needed.

## 2015-02-24 ENCOUNTER — Telehealth: Payer: Self-pay | Admitting: Internal Medicine

## 2015-02-24 NOTE — Telephone Encounter (Signed)
Patient is due for a BMP, orders are  in the computer, arrange a lab appointment please

## 2015-02-25 NOTE — Telephone Encounter (Signed)
She is scheduled for Friday 02-26-2015

## 2015-02-26 ENCOUNTER — Other Ambulatory Visit: Payer: 59

## 2015-03-01 ENCOUNTER — Other Ambulatory Visit: Payer: 59

## 2015-03-02 ENCOUNTER — Other Ambulatory Visit: Payer: 59

## 2015-03-03 ENCOUNTER — Other Ambulatory Visit: Payer: 59

## 2015-03-06 ENCOUNTER — Encounter (HOSPITAL_COMMUNITY): Payer: Self-pay | Admitting: Emergency Medicine

## 2015-03-06 ENCOUNTER — Emergency Department (HOSPITAL_COMMUNITY)
Admission: EM | Admit: 2015-03-06 | Discharge: 2015-03-06 | Disposition: A | Discharge status: Home / Self Care | Payer: 59 | Source: Home / Self Care | Attending: Family Medicine | Admitting: Family Medicine

## 2015-03-06 DIAGNOSIS — R141 Gas pain: Secondary | ICD-10-CM

## 2015-03-06 DIAGNOSIS — K5901 Slow transit constipation: Secondary | ICD-10-CM | POA: Diagnosis not present

## 2015-03-06 DIAGNOSIS — I10 Essential (primary) hypertension: Secondary | ICD-10-CM | POA: Diagnosis not present

## 2015-03-06 NOTE — Discharge Instructions (Signed)
Abdominal Pain, Adult Many things can cause abdominal pain. Usually, abdominal pain is not caused by a disease and will improve without treatment. It can often be observed and treated at home. Your health care provider will do a physical exam and possibly order blood tests and X-rays to help determine the seriousness of your pain. However, in many cases, more time must pass before a clear cause of the pain can be found. Before that point, your health care provider may not know if you need more testing or further treatment. HOME CARE INSTRUCTIONS Monitor your abdominal pain for any changes. The following actions may help to alleviate any discomfort you are experiencing:  Only take over-the-counter or prescription medicines as directed by your health care provider.  Do not take laxatives unless directed to do so by your health care provider.  Try a clear liquid diet (broth, tea, or water) as directed by your health care provider. Slowly move to a bland diet as tolerated. SEEK MEDICAL CARE IF:  You have unexplained abdominal pain.  You have abdominal pain associated with nausea or diarrhea.  You have pain when you urinate or have a bowel movement.  You experience abdominal pain that wakes you in the night.  You have abdominal pain that is worsened or improved by eating food.  You have abdominal pain that is worsened with eating fatty foods.  You have a fever. SEEK IMMEDIATE MEDICAL CARE IF:  Your pain does not go away within 2 hours.  You keep throwing up (vomiting).  Your pain is felt only in portions of the abdomen, such as the right side or the left lower portion of the abdomen.  You pass bloody or black tarry stools. MAKE SURE YOU:  Understand these instructions.  Will watch your condition.  Will get help right away if you are not doing well or get worse.   This information is not intended to replace advice given to you by your health care provider. Make sure you discuss  any questions you have with your health care provider.   Document Released: 11/16/2004 Document Revised: 10/28/2014 Document Reviewed: 10/16/2012 Elsevier Interactive Patient Education 2016 [employer].  Constipation, Adult Miralax as directed. Start with 2 glass full of one capful each tonight. May repeat tomorrow if needed.  May also take dulcolax . Increase fluids and fiber in diet.  If worse, fevers, bleeding, increase pain go to the ED. Constipation is when a person:  Poops (has a bowel movement) less than 3 times a week.  Has a hard time pooping.  Has poop that is dry, hard, or bigger than normal. HOME CARE   Eat foods with a lot of fiber in them. This includes fruits, vegetables, beans, and whole grains such as brown rice.  Avoid fatty foods and foods with a lot of sugar. This includes french fries, hamburgers, cookies, candy, and soda.  If you are not getting enough fiber from food, take products with added fiber in them (supplements).  Drink enough fluid to keep your pee (urine) clear or pale yellow.  Exercise on a regular basis, or as told by your doctor.  Go to the restroom when you feel like you need to poop. Do not hold it.  Only take medicine as told by your doctor. Do not take medicines that help you poop (laxatives) without talking to your doctor first. GET HELP RIGHT AWAY IF:   You have bright red blood in your poop (stool).  Your constipation lasts more than  4 days or gets worse.  You have belly (abdominal) or butt (rectal) pain.  You have thin poop (as thin as a pencil).  You lose weight, and it cannot be explained. MAKE SURE YOU:   Understand these instructions.  Will watch your condition.  Will get help right away if you are not doing well or get worse.   This information is not intended to replace advice given to you by your health care provider. Make sure you discuss any questions you have with your health care provider.   Document  Released: 07/26/2007 Document Revised: 02/27/2014 Document Reviewed: 11/18/2012 Elsevier Interactive Patient Education [employer] Elsevier Inc.

## 2015-03-06 NOTE — ED Notes (Signed)
C/o intermittent abd pain onset x5 days associated w/chills Denies fevers, urinary sx, v/n A&O x4... No acute distress.

## 2015-03-06 NOTE — ED Provider Notes (Signed)
CSN: [id number]     Arrival date & time 03/06/15  1738 History   First MD Initiated Contact with Patient 03/06/15 1854     Chief Complaint  Patient presents with  . Abdominal Pain   (Consider location/radiation/quality/duration/timing/severity/associated sxs/prior Treatment) HPI Comments: 50 year old female complaining of abdominal pain primarily to the far left and right of the abdomen. She has been having symptoms for 4 days. She also feels bloated. Her pain is described as crampy, comes and goes. She states just prior to having the abdominal pain she ate a large can of beans. When asked about bowel movement she says she is but had a normal bowel movement today. Later she stated that she was having loose stools. Denies nausea, vomiting or diarrhea. Denies fever or chills. Denies upper respiratory symptoms, chest pain or shortness of breath. History of cholecystectomy.   Past Medical History  Diagnosis Date  . Hypertension     dx age 89  . Menopause     LMP age 50  . Contraception     menopausal  . Chronic knee pain    Past Surgical History  Procedure Laterality Date  . Cholecystectomy    . Tonsillectomy     Family History  Problem Relation Age of Onset  . Coronary artery disease Neg Hx   . Stroke Neg Hx   . Colon cancer Neg Hx   . Breast cancer Neg Hx   . Hypertension Mother   . Hypertension Father   . Hypertension      GP  . Diabetes Mother   . Diabetes Father     GP   Social History  Substance Use Topics  . Smoking status: Never Smoker   . Smokeless tobacco: Never Used  . Alcohol Use: Yes   OB History    No data available     Review of Systems  Constitutional: Negative for fever, activity change and fatigue.  HENT: Negative.   Respiratory: Negative.   Cardiovascular: Negative.   Gastrointestinal: Positive for abdominal pain. Negative for nausea, vomiting, blood in stool and abdominal distention.  Genitourinary: Negative.   Musculoskeletal: Negative.    Neurological: Negative.     Allergies  Codeine  Home Medications   Prior to Admission medications   Medication Sig Start Date End Date Taking? Authorizing Provider  aspirin 81 MG tablet Take 81 mg by mouth daily.   Yes Historical Provider, MD  carvedilol (COREG) 6.25 MG tablet Take 1 tablet by mouth twice daily. 04/14/14  Yes Wanda PlumpJose E Paz, MD  losartan-hydrochlorothiazide (HYZAAR) 100-12.5 MG per tablet TAKE 1 TABLET BY MOUTH DAILY. 04/14/14  Yes Wanda PlumpJose E Paz, MD  diphenhydrAMINE (BENADRYL) 50 MG capsule Take 50 mg by mouth every 8 (eight) hours as needed for allergies.    Historical Provider, MD  EPINEPHrine 0.3 mg/0.3 mL IJ SOAJ injection Inject 0.3 mLs (0.3 mg total) into the muscle once. Patient not taking: Reported on 01/13/2015 09/06/14   Tomasita CrumbleAdeleke Oni, MD  potassium chloride (K-DUR,KLOR-CON) 10 MEQ tablet Take 1 tablet daily. 04/14/14   Wanda PlumpJose E Paz, MD  zolpidem (AMBIEN) 10 MG tablet Take 1 tablet (10 mg total) by mouth at bedtime as needed for sleep. 01/13/15   Wanda PlumpJose E Paz, MD   Meds Ordered and Administered this Visit  Medications - No data to display  BP 195/116 mmHg  Pulse 86  Temp(Src) 99.3 F (37.4 C) (Oral)  Resp 16  SpO2 99% No data found.   Physical Exam  Constitutional: She is  oriented to person, place, and time. She appears well-developed and well-nourished. No distress.  Eyes: EOM are normal.  Neck: Normal range of motion. Neck supple.  Cardiovascular: Normal rate.   Pulmonary/Chest: Effort normal. No respiratory distress.  Abdominal: Soft.  Bowel sounds present though hypoactive. Rest of the abdomen percusses dull. No masses. No rebound or guarding. Mild tenderness to the left upper quadrant. Less tenderness to the right mid quadrant. No tenderness over McBurney's point. No right upper quadrant tenderness.  Musculoskeletal: She exhibits no edema or tenderness.  Neurological: She is alert and oriented to person, place, and time. She exhibits normal muscle tone.   Skin: Skin is warm and dry.  Psychiatric: She has a normal mood and affect.  Nursing note and vitals reviewed.   ED Course  Procedures (including critical care time)  Labs Review Labs Reviewed - No data to display  Imaging Review No results found.   Visual Acuity Review  Right Eye Distance:   Left Eye Distance:   Bilateral Distance:    Right Eye Near:   Left Eye Near:    Bilateral Near:         MDM   1. Slow transit constipation   2. Abdominal gas pain    Miralax as directed. Start with 2 glass full of one capful each tonight. May repeat tomorrow if needed.  May also take dulcolax . Increase fluids and fiber in diet.  If worse, fevers, bleeding, increase pain go to the ED. Take your BP medicine and tonight.     Hayden Rasmussen, NP 03/06/15 1920

## 2015-03-08 ENCOUNTER — Emergency Department (HOSPITAL_COMMUNITY): Payer: 59

## 2015-03-08 ENCOUNTER — Inpatient Hospital Stay (HOSPITAL_COMMUNITY)
Admission: EM | Admit: 2015-03-08 | Discharge: 2015-03-11 | DRG: 392 | Disposition: A | Discharge status: Home / Self Care | Payer: 59 | Attending: Internal Medicine | Admitting: Internal Medicine

## 2015-03-08 ENCOUNTER — Encounter (HOSPITAL_COMMUNITY): Payer: Self-pay | Admitting: Emergency Medicine

## 2015-03-08 DIAGNOSIS — K5792 Diverticulitis of intestine, part unspecified, without perforation or abscess without bleeding: Secondary | ICD-10-CM

## 2015-03-08 DIAGNOSIS — E876 Hypokalemia: Secondary | ICD-10-CM | POA: Diagnosis present

## 2015-03-08 DIAGNOSIS — Z79899 Other long term (current) drug therapy: Secondary | ICD-10-CM | POA: Diagnosis not present

## 2015-03-08 DIAGNOSIS — R1032 Left lower quadrant pain: Secondary | ICD-10-CM | POA: Diagnosis not present

## 2015-03-08 DIAGNOSIS — I10 Essential (primary) hypertension: Secondary | ICD-10-CM | POA: Diagnosis present

## 2015-03-08 DIAGNOSIS — G8929 Other chronic pain: Secondary | ICD-10-CM | POA: Diagnosis present

## 2015-03-08 DIAGNOSIS — Z833 Family history of diabetes mellitus: Secondary | ICD-10-CM

## 2015-03-08 DIAGNOSIS — M25569 Pain in unspecified knee: Secondary | ICD-10-CM | POA: Diagnosis present

## 2015-03-08 DIAGNOSIS — K5720 Diverticulitis of large intestine with perforation and abscess without bleeding: Secondary | ICD-10-CM | POA: Diagnosis not present

## 2015-03-08 DIAGNOSIS — Z7982 Long term (current) use of aspirin: Secondary | ICD-10-CM

## 2015-03-08 DIAGNOSIS — Z91018 Allergy to other foods: Secondary | ICD-10-CM

## 2015-03-08 DIAGNOSIS — B9620 Unspecified Escherichia coli [E. coli] as the cause of diseases classified elsewhere: Secondary | ICD-10-CM | POA: Diagnosis present

## 2015-03-08 DIAGNOSIS — Z8249 Family history of ischemic heart disease and other diseases of the circulatory system: Secondary | ICD-10-CM | POA: Diagnosis not present

## 2015-03-08 DIAGNOSIS — K651 Peritoneal abscess: Secondary | ICD-10-CM | POA: Diagnosis not present

## 2015-03-08 DIAGNOSIS — Z885 Allergy status to narcotic agent status: Secondary | ICD-10-CM | POA: Diagnosis not present

## 2015-03-08 DIAGNOSIS — R109 Unspecified abdominal pain: Secondary | ICD-10-CM | POA: Diagnosis not present

## 2015-03-08 LAB — URINALYSIS, ROUTINE W REFLEX MICROSCOPIC
BILIRUBIN URINE: NEGATIVE
GLUCOSE, UA: NEGATIVE mg/dL
KETONES UR: NEGATIVE mg/dL
Leukocytes, UA: NEGATIVE
Nitrite: POSITIVE — AB
PH: 6 (ref 5.0–8.0)
PROTEIN: NEGATIVE mg/dL
Specific Gravity, Urine: >1.046 — ABNORMAL HIGH (ref 1.005–1.030)

## 2015-03-08 LAB — COMPREHENSIVE METABOLIC PANEL
ALBUMIN: 3.9 g/dL (ref 3.5–5.0)
ALT: 19 U/L (ref 14–54)
AST: 17 U/L (ref 15–41)
Alkaline Phosphatase: 82 U/L (ref 38–126)
Anion gap: 11 (ref 5–15)
BUN: 15 mg/dL (ref 6–20)
CHLORIDE: 101 mmol/L (ref 101–111)
CO2: 27 mmol/L (ref 22–32)
CREATININE: 0.84 mg/dL (ref 0.44–1.00)
Calcium: 9.2 mg/dL (ref 8.9–10.3)
GFR calc Af Amer: >60 mL/min (ref >60)
GLUCOSE: 116 mg/dL — AB (ref 65–99)
POTASSIUM: 3.6 mmol/L (ref 3.5–5.1)
Sodium: 139 mmol/L (ref 135–145)
Total Bilirubin: 1 mg/dL (ref 0.3–1.2)
Total Protein: 7.7 g/dL (ref 6.5–8.1)

## 2015-03-08 LAB — CBC
HEMATOCRIT: 38.1 % (ref 36.0–46.0)
Hemoglobin: 12.4 g/dL (ref 12.0–15.0)
MCH: 29.4 pg (ref 26.0–34.0)
MCHC: 32.5 g/dL (ref 30.0–36.0)
MCV: 90.3 fL (ref 78.0–100.0)
PLATELETS: 358 10*3/uL (ref 150–400)
RBC: 4.22 MIL/uL (ref 3.87–5.11)
RDW: 12.6 % (ref 11.5–15.5)
WBC: 9.4 10*3/uL (ref 4.0–10.5)

## 2015-03-08 LAB — URINE MICROSCOPIC-ADD ON

## 2015-03-08 LAB — LIPASE, BLOOD: Lipase: 24 U/L (ref 11–51)

## 2015-03-08 MED ORDER — HYDRALAZINE HCL 20 MG/ML IJ SOLN
10.0000 mg | Freq: Four times a day (QID) | INTRAMUSCULAR | Status: DC | PRN
  Administered 2015-03-11: 10 mg via INTRAVENOUS
  Filled 2015-03-08: qty 1

## 2015-03-08 MED ORDER — OXYCODONE HCL 5 MG PO TABS: 5.0000 mg | ORAL_TABLET | ORAL | Status: DC | PRN

## 2015-03-08 MED ORDER — ALBUTEROL SULFATE (2.5 MG/3ML) 0.083% IN NEBU: 2.5000 mg | INHALATION_SOLUTION | RESPIRATORY_TRACT | Status: DC | PRN

## 2015-03-08 MED ORDER — SODIUM CHLORIDE 0.9 % IV SOLN
INTRAVENOUS | Status: DC
  Administered 2015-03-08: 16:00:00 via INTRAVENOUS

## 2015-03-08 MED ORDER — MORPHINE SULFATE (PF) 2 MG/ML IV SOLN
1.0000 mg | INTRAVENOUS | Status: DC | PRN
  Administered 2015-03-08 – 2015-03-09 (×3): 1 mg via INTRAVENOUS
  Filled 2015-03-08 (×3): qty 1

## 2015-03-08 MED ORDER — IOHEXOL 300 MG/ML  SOLN
100.0000 mL | Freq: Once | INTRAMUSCULAR | Status: AC | PRN
  Administered 2015-03-08: 100 mL via INTRAVENOUS

## 2015-03-08 MED ORDER — ENOXAPARIN SODIUM 40 MG/0.4ML ~~LOC~~ SOLN
40.0000 mg | SUBCUTANEOUS | Status: DC
Start: 2015-03-08 — End: 2015-03-11
  Administered 2015-03-08 – 2015-03-10 (×3): 40 mg via SUBCUTANEOUS
  Filled 2015-03-08 (×4): qty 0.4

## 2015-03-08 MED ORDER — IOHEXOL 300 MG/ML  SOLN
50.0000 mL | Freq: Once | INTRAMUSCULAR | Status: DC | PRN
  Administered 2015-03-08: 50 mL via ORAL
  Filled 2015-03-08: qty 50

## 2015-03-08 MED ORDER — CIPROFLOXACIN IN D5W 400 MG/200ML IV SOLN
400.0000 mg | Freq: Once | INTRAVENOUS | Status: AC
  Administered 2015-03-08: 400 mg via INTRAVENOUS
  Filled 2015-03-08: qty 200

## 2015-03-08 MED ORDER — PIPERACILLIN-TAZOBACTAM 3.375 G IVPB
3.3750 g | Freq: Three times a day (TID) | INTRAVENOUS | Status: DC
  Administered 2015-03-08 – 2015-03-09 (×2): 3.375 g via INTRAVENOUS
  Filled 2015-03-08 (×4): qty 50

## 2015-03-08 MED ORDER — CARVEDILOL 6.25 MG PO TABS
6.2500 mg | ORAL_TABLET | Freq: Two times a day (BID) | ORAL | Status: DC
  Administered 2015-03-08 – 2015-03-09 (×2): 6.25 mg via ORAL
  Filled 2015-03-08 (×4): qty 1

## 2015-03-08 MED ORDER — METRONIDAZOLE IN NACL 5-0.79 MG/ML-% IV SOLN
500.0000 mg | Freq: Once | INTRAVENOUS | Status: AC
  Administered 2015-03-08: 500 mg via INTRAVENOUS
  Filled 2015-03-08: qty 100

## 2015-03-08 MED ORDER — ONDANSETRON HCL 4 MG PO TABS: 4.0000 mg | ORAL_TABLET | Freq: Four times a day (QID) | ORAL | Status: DC | PRN

## 2015-03-08 MED ORDER — ACETAMINOPHEN 650 MG RE SUPP: 650.0000 mg | Freq: Four times a day (QID) | RECTAL | Status: DC | PRN

## 2015-03-08 MED ORDER — PIPERACILLIN-TAZOBACTAM 3.375 G IVPB 30 MIN
3.3750 g | INTRAVENOUS | Status: AC
  Administered 2015-03-08: 3.375 g via INTRAVENOUS

## 2015-03-08 MED ORDER — ONDANSETRON HCL 4 MG/2ML IJ SOLN: 4.0000 mg | Freq: Four times a day (QID) | INTRAMUSCULAR | Status: DC | PRN

## 2015-03-08 MED ORDER — ACETAMINOPHEN 325 MG PO TABS: 650.0000 mg | ORAL_TABLET | Freq: Four times a day (QID) | ORAL | Status: DC | PRN

## 2015-03-08 NOTE — ED Provider Notes (Signed)
CSN: [id number]     Arrival date & time 03/08/15  0704 History   First MD Initiated Contact with Patient 03/08/15 [phone]     Chief Complaint  Patient presents with  . Abdominal Pain     (Consider location/radiation/quality/duration/timing/severity/associated sxs/prior Treatment) HPI Comments: Pt seen for similar sx 3 days ago and dx w/ constipation  Patient is a 50 y.o. female presenting with abdominal pain. The history is provided by the patient.  Abdominal Pain Pain location:  LLQ Pain quality: bloating and cramping   Pain radiates to:  Does not radiate Pain severity:  Mild Onset quality:  Sudden Timing:  Sporadic Progression:  Waxing and waning Chronicity:  New Relieved by:  Nothing Worsened by:  NSAIDs Ineffective treatments:  None tried Associated symptoms: no dysuria, no fever, no melena, no nausea, no vaginal bleeding, no vaginal discharge and no vomiting     Past Medical History  Diagnosis Date  . Hypertension     dx age 50  . Menopause     LMP age 50  . Contraception     menopausal  . Chronic knee pain    Past Surgical History  Procedure Laterality Date  . Cholecystectomy    . Tonsillectomy     Family History  Problem Relation Age of Onset  . Coronary artery disease Neg Hx   . Stroke Neg Hx   . Colon cancer Neg Hx   . Breast cancer Neg Hx   . Hypertension Mother   . Hypertension Father   . Hypertension      GP  . Diabetes Mother   . Diabetes Father     GP   Social History  Substance Use Topics  . Smoking status: Never Smoker   . Smokeless tobacco: Never Used  . Alcohol Use: Yes   OB History    No data available     Review of Systems  Constitutional: Negative for fever.  Gastrointestinal: Positive for abdominal pain. Negative for nausea, vomiting and melena.  Genitourinary: Negative for dysuria, vaginal bleeding and vaginal discharge.  All other systems reviewed and are negative.     Allergies  Codeine  Home Medications   Prior to  Admission medications   Medication Sig Start Date End Date Taking? Authorizing Provider  aspirin 81 MG tablet Take 81 mg by mouth daily.    Historical Provider, MD  carvedilol (COREG) 6.25 MG tablet Take 1 tablet by mouth twice daily. 04/14/14   Wanda PlumpJose E Paz, MD  diphenhydrAMINE (BENADRYL) 50 MG capsule Take 50 mg by mouth every 8 (eight) hours as needed for allergies.    Historical Provider, MD  EPINEPHrine 0.3 mg/0.3 mL IJ SOAJ injection Inject 0.3 mLs (0.3 mg total) into the muscle once. Patient not taking: Reported on 01/13/2015 09/06/14   Tomasita CrumbleAdeleke Oni, MD  losartan-hydrochlorothiazide (HYZAAR) 100-12.5 MG per tablet TAKE 1 TABLET BY MOUTH DAILY. 04/14/14   Wanda PlumpJose E Paz, MD  potassium chloride (K-DUR,KLOR-CON) 10 MEQ tablet Take 1 tablet daily. 04/14/14   Wanda PlumpJose E Paz, MD  zolpidem (AMBIEN) 10 MG tablet Take 1 tablet (10 mg total) by mouth at bedtime as needed for sleep. 01/13/15   Wanda PlumpJose E Paz, MD   BP 183/98 mmHg  Pulse 89  Temp(Src) 98.9 F (37.2 C)  SpO2 98% Physical Exam  Constitutional: She is oriented to person, place, and time. She appears well-developed and well-nourished.  Non-toxic appearance. No distress.  HENT:  Head: Normocephalic and atraumatic.  Eyes: Conjunctivae, EOM and lids are  normal. Pupils are equal, round, and reactive to light.  Neck: Normal range of motion. Neck supple. No tracheal deviation present. No thyroid mass present.  Cardiovascular: Normal rate, regular rhythm and normal heart sounds.  Exam reveals no gallop.   No murmur heard. Pulmonary/Chest: Effort normal and breath sounds normal. No stridor. No respiratory distress. She has no decreased breath sounds. She has no wheezes. She has no rhonchi. She has no rales.  Abdominal: Soft. Normal appearance and bowel sounds are normal. She exhibits no distension. There is tenderness in the left lower quadrant. There is no rigidity, no rebound, no guarding and no CVA tenderness.    Musculoskeletal: Normal range of motion.  She exhibits no edema or tenderness.  Neurological: She is alert and oriented to person, place, and time. She has normal strength. No cranial nerve deficit or sensory deficit. GCS eye subscore is 4. GCS verbal subscore is 5. GCS motor subscore is 6.  Skin: Skin is warm and dry. No abrasion and no rash noted.  Psychiatric: She has a normal mood and affect. Her speech is normal and behavior is normal.  Nursing note and vitals reviewed.   ED Course  Procedures (including critical care time) Labs Review Labs Reviewed  LIPASE, BLOOD  COMPREHENSIVE METABOLIC PANEL  CBC  URINALYSIS, ROUTINE W REFLEX MICROSCOPIC (NOT AT [facility])    Imaging Review No results found. I have personally reviewed and evaluated these images and lab results as part of my medical decision-making.   EKG Interpretation None      MDM   Final diagnoses:  None    Pt with evidence of diverticultis w/ abscess on abd ct, medicine to admit    Lorre Nick, MD 03/08/15 1132

## 2015-03-08 NOTE — H&P (Signed)
[facility] History and Physical  Sara Norris XQJ:[id number] DOB: 04-12-1965 DOA: 03/08/2015   PCP: Kathlene November, MD  Specialists: None  Chief Complaint: Left-sided abdominal pain for 1 week  HPI: Sara Norris is a 50 y.o. female with a past medical history of hypertension who was in her usual state of health till about a week ago when she started developing pain in the left side of her abdomen. She denies any nausea, vomiting, fever or chills. She has been having regular bowel movements. She blames the onset of her symptoms on some beans that she ate on Monday. She has been taking Gas-X without any relief. She also took some magnesium citrate yesterday without any relief. She also saw providers at urgent care center recently for her symptoms. Her pain is currently 8 or 10 in intensity. Is on and off. No radiation. No aggravating or relieving factors.  Home Medications: Prior to Admission medications   Medication Sig Start Date End Date Taking? Authorizing Provider  aspirin EC 81 MG tablet Take 81 mg by mouth daily.   Yes Historical Provider, MD  carvedilol (COREG) 6.25 MG tablet Take 1 tablet by mouth twice daily. 04/14/14  Yes Colon Branch, MD  ibuprofen (ADVIL,MOTRIN) 200 MG tablet Take 200 mg by mouth every 6 (six) hours as needed for moderate pain.   Yes Historical Provider, MD  losartan-hydrochlorothiazide (HYZAAR) 100-12.5 MG per tablet TAKE 1 TABLET BY MOUTH DAILY. 04/14/14  Yes Colon Branch, MD  magnesium citrate SOLN Take 1 Bottle by mouth once.   Yes Historical Provider, MD  potassium chloride (K-DUR,KLOR-CON) 10 MEQ tablet Take 1 tablet daily. 04/14/14  Yes Colon Branch, MD  zolpidem (AMBIEN) 10 MG tablet Take 1 tablet (10 mg total) by mouth at bedtime as needed for sleep. 01/13/15  Yes Colon Branch, MD  diphenhydrAMINE (BENADRYL) 50 MG capsule Take 50 mg by mouth every 8 (eight) hours as needed for allergies. Reported on 03/08/2015    Historical Provider, MD  EPINEPHrine 0.3 mg/0.3 mL  IJ SOAJ injection Inject 0.3 mLs (0.3 mg total) into the muscle once. Patient not taking: Reported on 03/08/2015 09/06/14   Everlene Balls, MD    Allergies:  Allergies  Allergen Reactions  . Citrus Rash  . Codeine Rash  . Pollen Extract     sneezing    Past Medical History: Past Medical History  Diagnosis Date  . Hypertension     dx age 67  . Menopause     LMP age 74  . Contraception     menopausal  . Chronic knee pain     Past Surgical History  Procedure Laterality Date  . Cholecystectomy    . Tonsillectomy      Social History: She lives in [place] by herself. 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 She does not smoke. Social drinking. Independent with daily activities.  Family History:  Family History  Problem Relation Age of Onset  . Coronary artery disease Neg Hx   . Stroke Neg Hx   . Colon cancer Neg Hx   . Breast cancer Neg Hx   . Hypertension Mother   . Hypertension Father   . Hypertension      GP  . Diabetes Mother   . Diabetes Father     GP     Review of Systems - History obtained from the patient General ROS: positive for  - fatigue Psychological ROS: negative Ophthalmic ROS: negative ENT ROS:  negative Allergy and Immunology ROS: negative Hematological and Lymphatic ROS: negative Endocrine ROS: negative Respiratory ROS: no cough, shortness of breath, or wheezing Cardiovascular ROS: no chest pain or dyspnea on exertion Gastrointestinal ROS: as in hpi Genito-Urinary ROS: no dysuria, trouble voiding, or hematuria Musculoskeletal ROS: negative Neurological ROS: no TIA or stroke symptoms Dermatological ROS: negative  Physical Examination  Filed Vitals:   03/08/15 1151 03/08/15 1357 03/08/15 1506 03/08/15 1537  BP: 188/102 165/105 184/107   Pulse: 76 73 75   Temp:      Resp: _0 Height:    _1  (1.727 m)  Weight:    92.534 kg (204 lb)  SpO2: 100% 99% 100%     BP 184/107 mmHg  Pulse 75  Temp(Src) 98.9 F  (37.2 C)  Resp 16  Ht _2  (1.727 m)  Wt 92.534 kg (204 lb)  BMI 31.03 kg/m2  SpO2 100%  General appearance: alert, cooperative, appears stated age and no distress Head: Normocephalic, without obvious abnormality, atraumatic Eyes: conjunctivae/corneas clear. PERRL, EOM's intact.  Throat: lips, mucosa, and tongue normal; teeth and gums normal Neck: no adenopathy, no carotid bruit, no JVD, supple, symmetrical, trachea midline and thyroid not enlarged, symmetric, no tenderness/mass/nodules Resp: clear to auscultation bilaterally Cardio: regular rate and rhythm, S1, S2 normal, no murmur, click, rub or gallop GI: Abdomen is soft. Tender in the left lower quadrant without any rebound, rigidity or guarding. No masses or organomegaly. Bowel sounds are present. Extremities: extremities normal, atraumatic, no cyanosis or edema Pulses: 2+ and symmetric Skin: Skin color, texture, turgor normal. No rashes or lesions Lymph nodes: Cervical, supraclavicular, and axillary nodes normal. Neurologic: Alert and oriented 3. Cranial nerves II-12 intact. Motor strength equal bilateral upper and lower extremities.  Laboratory Data: Results for orders placed or performed during the hospital encounter of 03/08/15 (from the past 48 hour(s))  Lipase, blood     Status: None   Collection Time: 03/08/15  7:51 AM  Result Value Ref Range   Lipase 24 11 - 51 U/L  Comprehensive metabolic panel     Status: Abnormal   Collection Time: 03/08/15  7:51 AM  Result Value Ref Range   Sodium 139 135 - 145 mmol/L   Potassium 3.6 3.5 - 5.1 mmol/L   Chloride 101 101 - 111 mmol/L   CO2 27 22 - 32 mmol/L   Glucose, Bld 116 (H) 65 - 99 mg/dL   BUN 15 6 - 20 mg/dL   Creatinine, Ser 0.84 0.44 - 1.00 mg/dL   Calcium 9.2 8.9 - 10.3 mg/dL   Total Protein 7.7 6.5 - 8.1 g/dL   Albumin 3.9 3.5 - 5.0 g/dL   AST 17 15 - 41 U/L   ALT 19 14 - 54 U/L   Alkaline Phosphatase 82 38 - 126 U/L   Total Bilirubin 1.0 0.3 - 1.2 mg/dL   GFR  calc non Af Amer >60 >60 mL/min   GFR calc Af Amer >60 >60 mL/min    Comment: (NOTE) The eGFR has been calculated using the CKD EPI equation. This calculation has not been validated in all clinical situations. eGFR's persistently <60 mL/min signify possible Chronic Kidney Disease.    Anion gap 11 5 - 15  CBC     Status: None   Collection Time: 03/08/15  7:51 AM  Result Value Ref Range   WBC 9.4 4.0 - 10.5 K/uL   RBC 4.22 3.87 - 5.11 MIL/uL   Hemoglobin 12.4 12.0 -  15.0 g/dL   HCT 38.1 36.0 - 46.0 %   MCV 90.3 78.0 - 100.0 fL   MCH 29.4 26.0 - 34.0 pg   MCHC 32.5 30.0 - 36.0 g/dL   RDW 12.6 11.5 - 15.5 %   Platelets 358 150 - 400 K/uL  Urinalysis, Routine w reflex microscopic (not at [facility])     Status: Abnormal   Collection Time: 03/08/15 10:43 AM  Result Value Ref Range   Color, Urine YELLOW YELLOW   APPearance CLEAR CLEAR   Specific Gravity, Urine >1.046 (H) 1.005 - 1.030   pH 6.0 5.0 - 8.0   Glucose, UA NEGATIVE NEGATIVE mg/dL   Hgb urine dipstick TRACE (A) NEGATIVE   Bilirubin Urine NEGATIVE NEGATIVE   Ketones, ur NEGATIVE NEGATIVE mg/dL   Protein, ur NEGATIVE NEGATIVE mg/dL   Nitrite POSITIVE (A) NEGATIVE   Leukocytes, UA NEGATIVE NEGATIVE  Urine microscopic-add on     Status: Abnormal   Collection Time: 03/08/15 10:43 AM  Result Value Ref Range   Squamous Epithelial / LPF 6-30 (A) NONE SEEN   WBC, UA 0-5 0 - 5 WBC/hpf   RBC / HPF 0-5 0 - 5 RBC/hpf   Bacteria, UA MANY (A) NONE SEEN    Radiology Reports: Ct Abdomen Pelvis W Contrast  03/08/2015  ADDENDUM REPORT: 03/08/2015 11:01 ADDENDUM: This addendum is given for the purpose of correcting the impression in the initially dictated report. The first impression should read as follows: Diverticulitis of the distal descending colon with associated abscess measuring 2.5 cm in diameter. Electronically Signed   By: Inge Rise M.D.   On: 03/08/2015 11:01  03/08/2015  CLINICAL DATA:  Left side abdominal pain since  03/02/2015. No known injury. Initial encounter. EXAM: CT ABDOMEN AND PELVIS WITH CONTRAST TECHNIQUE: Multidetector CT imaging of the abdomen and pelvis was performed using the standard protocol following bolus administration of intravenous contrast. CONTRAST:  100 mL OMNIPAQUE IOHEXOL 300 MG/ML  SOLN COMPARISON:  None. FINDINGS: There is some dependent atelectasis in the lung bases. No pleural or pericardial effusion. Heart size is normal. Small hiatal hernia is noted. The patient is status post cholecystectomy. The liver, spleen, adrenal glands, pancreas and right kidney are unremarkable. Small cyst in the upper pole of the left kidney is noted. There is extensive stranding about the distal descending colon at its junction with the sigmoid most consistent with diverticulitis. An associated abscess is seen measuring 2.5 cm AP x 2.5 cm transverse by 2.4 cm craniocaudal. The colon is otherwise unremarkable in appearance. The small bowel and appendix appear normal. There is no lymphadenopathy. Small fat containing umbilical hernia is noted. Uterus, adnexa and urinary bladder appear normal. No focal bony abnormality. IMPRESSION: Diverticulosis of the distal descending colon with an associated abscess measuring 2.5 cm in diameter. Small hiatal hernia. Small fat containing umbilical hernia. Status post cholecystectomy. Electronically Signed: By: Inge Rise M.D. On: 03/08/2015 09:55     Problem List  Active Problems:   Essential hypertension   Diverticulitis   Acute diverticulitis   Intra-abdominal abscess ([place])   Assessment: This is a 50 year old African-American female with a past medical history of hypertension and presents with one-week history of left-sided abdominal pain. CT scan shows an abscess measuring about 2.5 cm along with diverticulosis.  Plan: #1 Acute Diverticulitis with abscess: Patient will be admitted to the hospital. She'll be kept nothing by mouth. She'll be given intravenous  Zosyn. General surgery has been consulted. Pain medications as needed. Symptomatic treatment  for now. Defer to surgery regarding drainage of the abscess.  #2 history of essential hypertension: Hydralazine as needed. Continue carvedilol orally. Monitor blood pressures closely.  #3 Abnormal UA: Not conclusive for infection. Order urine cultures. Antibiotics as above.  DVT Prophylaxis: Lovenox Code Status: Full code Family Communication: Discussed with the patient.  Disposition Plan: Admit to MedSurg   Further management decisions will depend on results of further testing and patient's response to treatment.   [facility]  [facility] Pager [phone]  If 7PM-7AM, please contact night-coverage [email or web address] Password [email or web address]  03/08/2015, 5:10 PM

## 2015-03-08 NOTE — Consult Note (Signed)
Reason for Consult: diverticulitis with abscess Referring Physician: Dr. Lacretia Leigh (ED)  Sara Norris is an 50 y.o. female.  HPI: Pt presents with abdominal pain left abdomen that comes and goes.  She had eaten a can of beans 03/02/15,she had pain and attributed it to gas.  She  reports + BM when this all started, she is very regular but then developed loose stools.  She described her pain as crampy/gas like pain. Pain has persisted and she came in for evaluation.  No nausea or vomiting, no fever, she has never had this before. Work up in the ED shows she is afebrile, but hypertensive.  She has bacteria in the urine, but 0-5 WBC.  WBC is 9.4, h/H is stable.  CMP is normal.  CT scan shows:  extensive stranding about the distal descending colon at its junction with the sigmoid most consistent with diverticulitis.  An associated abscess is seen measuring 2.5 cm AP x 2.5 cm transverse by 2.4 cm craniocaudal. The colon is otherwise unremarkable in appearance. The small bowel and appendix appear normal. There is no lymphadenopathy. Small fat containing umbilical hernia is noted. Uterus, adnexa and urinary bladder appear normal.  She took ibuprofen and isn't having much pain now.  We are ask to see  Past Medical History  Diagnosis Date  . Hypertension     dx age 19  . Menopause     LMP age 7  . Contraception     menopausal  . Chronic knee pain     Past Surgical History  Procedure Laterality Date  . Cholecystectomy    . Tonsillectomy      Family History  Problem Relation Age of Onset  . Coronary artery disease Neg Hx   . Stroke Neg Hx   . Colon cancer Neg Hx   . Breast cancer Neg Hx   . Hypertension Mother   . Hypertension Father   . Hypertension      GP  . Diabetes Mother   . Diabetes Father     GP    Social History:  reports that she has never smoked. She has never used smokeless tobacco. She reports that she drinks alcohol. She reports that she does not use illicit  drugs. Tobacco:  Never ETOH:  Social Drugs:  None Single, son is 20 and lives in his own place. She works here for [employer].   Allergies:  Allergies  Allergen Reactions  . Citrus Rash  . Codeine Rash  . Pollen Extract     sneezing   Prior to Admission medications   Medication Sig Start Date End Date Taking? Authorizing Provider  aspirin EC 81 MG tablet Take 81 mg by mouth daily.   Yes Historical Provider, MD  carvedilol (COREG) 6.25 MG tablet Take 1 tablet by mouth twice daily. 04/14/14  Yes Colon Branch, MD  ibuprofen (ADVIL,MOTRIN) 200 MG tablet Take 200 mg by mouth every 6 (six) hours as needed for moderate pain.   Yes Historical Provider, MD  losartan-hydrochlorothiazide (HYZAAR) 100-12.5 MG per tablet TAKE 1 TABLET BY MOUTH DAILY. 04/14/14  Yes Colon Branch, MD  magnesium citrate SOLN Take 1 Bottle by mouth once.   Yes Historical Provider, MD  potassium chloride (K-DUR,KLOR-CON) 10 MEQ tablet Take 1 tablet daily. 04/14/14  Yes Colon Branch, MD  zolpidem (AMBIEN) 10 MG tablet Take 1 tablet (10 mg total) by mouth at bedtime as needed for sleep. 01/13/15  Yes Colon Branch, MD  diphenhydrAMINE (BENADRYL) 50 MG capsule Take 50 mg by mouth every 8 (eight) hours as needed for allergies. Reported on 03/08/2015    Historical Provider, MD  EPINEPHrine 0.3 mg/0.3 mL IJ SOAJ injection Inject 0.3 mLs (0.3 mg total) into the muscle once. Patient not taking: Reported on 03/08/2015 09/06/14   Everlene Balls, MD     Medication Anti-infectives    Start     Dose/Rate Route Frequency Ordered Stop   03/08/15 1100  ciprofloxacin (CIPRO) IVPB 400 mg     400 mg 200 mL/hr over 60 Minutes Intravenous  Once 03/08/15 1058 03/08/15 1217   03/08/15 1100  metroNIDAZOLE (FLAGYL) IVPB 500 mg     500 mg 100 mL/hr over 60 Minutes Intravenous  Once 03/08/15 1058 03/08/15 1216      Results for orders placed or performed during the hospital encounter of 03/08/15 (from the past 48 hour(s))  Lipase, blood      Status: None   Collection Time: 03/08/15  7:51 AM  Result Value Ref Range   Lipase 24 11 - 51 U/L  Comprehensive metabolic panel     Status: Abnormal   Collection Time: 03/08/15  7:51 AM  Result Value Ref Range   Sodium 139 135 - 145 mmol/L   Potassium 3.6 3.5 - 5.1 mmol/L   Chloride 101 101 - 111 mmol/L   CO2 27 22 - 32 mmol/L   Glucose, Bld 116 (H) 65 - 99 mg/dL   BUN 15 6 - 20 mg/dL   Creatinine, Ser 0.84 0.44 - 1.00 mg/dL   Calcium 9.2 8.9 - 10.3 mg/dL   Total Protein 7.7 6.5 - 8.1 g/dL   Albumin 3.9 3.5 - 5.0 g/dL   AST 17 15 - 41 U/L   ALT 19 14 - 54 U/L   Alkaline Phosphatase 82 38 - 126 U/L   Total Bilirubin 1.0 0.3 - 1.2 mg/dL   GFR calc non Af Amer >60 >60 mL/min   GFR calc Af Amer >60 >60 mL/min    Comment: (NOTE) The eGFR has been calculated using the CKD EPI equation. This calculation has not been validated in all clinical situations. eGFR's persistently <60 mL/min signify possible Chronic Kidney Disease.    Anion gap 11 5 - 15  CBC     Status: None   Collection Time: 03/08/15  7:51 AM  Result Value Ref Range   WBC 9.4 4.0 - 10.5 K/uL   RBC 4.22 3.87 - 5.11 MIL/uL   Hemoglobin 12.4 12.0 - 15.0 g/dL   HCT 38.1 36.0 - 46.0 %   MCV 90.3 78.0 - 100.0 fL   MCH 29.4 26.0 - 34.0 pg   MCHC 32.5 30.0 - 36.0 g/dL   RDW 12.6 11.5 - 15.5 %   Platelets 358 150 - 400 K/uL  Urinalysis, Routine w reflex microscopic (not at [facility])     Status: Abnormal   Collection Time: 03/08/15 10:43 AM  Result Value Ref Range   Color, Urine YELLOW YELLOW   APPearance CLEAR CLEAR   Specific Gravity, Urine >1.046 (H) 1.005 - 1.030   pH 6.0 5.0 - 8.0   Glucose, UA NEGATIVE NEGATIVE mg/dL   Hgb urine dipstick TRACE (A) NEGATIVE   Bilirubin Urine NEGATIVE NEGATIVE   Ketones, ur NEGATIVE NEGATIVE mg/dL   Protein, ur NEGATIVE NEGATIVE mg/dL   Nitrite POSITIVE (A) NEGATIVE   Leukocytes, UA NEGATIVE NEGATIVE  Urine microscopic-add on     Status: Abnormal   Collection Time: 03/08/15 10:43  AM  Result Value Ref Range   Squamous Epithelial / LPF 6-30 (A) NONE SEEN   WBC, UA 0-5 0 - 5 WBC/hpf   RBC / HPF 0-5 0 - 5 RBC/hpf   Bacteria, UA MANY (A) NONE SEEN    Ct Abdomen Pelvis W Contrast  03/08/2015  ADDENDUM REPORT: 03/08/2015 11:01 ADDENDUM: This addendum is given for the purpose of correcting the impression in the initially dictated report. The first impression should read as follows: Diverticulitis of the distal descending colon with associated abscess measuring 2.5 cm in diameter. Electronically Signed   By: Inge Rise M.D.   On: 03/08/2015 11:01  03/08/2015  CLINICAL DATA:  Left side abdominal pain since 03/02/2015. No known injury. Initial encounter. EXAM: CT ABDOMEN AND PELVIS WITH CONTRAST TECHNIQUE: Multidetector CT imaging of the abdomen and pelvis was performed using the standard protocol following bolus administration of intravenous contrast. CONTRAST:  100 mL OMNIPAQUE IOHEXOL 300 MG/ML  SOLN COMPARISON:  None. FINDINGS: There is some dependent atelectasis in the lung bases. No pleural or pericardial effusion. Heart size is normal. Small hiatal hernia is noted. The patient is status post cholecystectomy. The liver, spleen, adrenal glands, pancreas and right kidney are unremarkable. Small cyst in the upper pole of the left kidney is noted. There is extensive stranding about the distal descending colon at its junction with the sigmoid most consistent with diverticulitis. An associated abscess is seen measuring 2.5 cm AP x 2.5 cm transverse by 2.4 cm craniocaudal. The colon is otherwise unremarkable in appearance. The small bowel and appendix appear normal. There is no lymphadenopathy. Small fat containing umbilical hernia is noted. Uterus, adnexa and urinary bladder appear normal. No focal bony abnormality. IMPRESSION: Diverticulosis of the distal descending colon with an associated abscess measuring 2.5 cm in diameter. Small hiatal hernia. Small fat containing umbilical  hernia. Status post cholecystectomy. Electronically Signed: By: Inge Rise M.D. On: 03/08/2015 09:55    Review of Systems  Constitutional: Negative.   Eyes: Negative.   Respiratory: Negative.   Cardiovascular: Negative.   Gastrointestinal: Positive for heartburn (occasionally,) and abdominal pain (she thought this was gas to start, but it has not gotten better.  Ongoing since 03/02/15). Negative for nausea, vomiting, diarrhea, constipation, blood in stool and melena.       She has never had this before, and has not had a colonoscopy so far.  Age 68.  Genitourinary: Negative.   Musculoskeletal: Positive for joint pain (She has had knee pain in the past, not an issue now.).  Skin: Negative.   Neurological: Negative.   Endo/Heme/Allergies: Negative.   Psychiatric/Behavioral: Negative.    Blood pressure 188/102, pulse 76, temperature 98.9 F (37.2 C), resp. rate 19, SpO2 100 %. Physical Exam  Constitutional: She is oriented to person, place, and time. She appears well-developed and well-nourished. No distress.  HENT:  Head: Normocephalic and atraumatic.  Nose: Nose normal.  Eyes: Conjunctivae and EOM are normal. Right eye exhibits no discharge. Left eye exhibits no discharge. No scleral icterus.  Neck: Normal range of motion. Neck supple. No JVD present. No tracheal deviation present. No thyromegaly present.  Cardiovascular: Normal rate, regular rhythm, normal heart sounds and intact distal pulses.   No murmur heard. Respiratory: Effort normal and breath sounds normal. No respiratory distress. She has no wheezes. She has no rales. She exhibits no tenderness.  GI: Soft. Bowel sounds are normal. She exhibits no distension and no mass. There is tenderness (LEFT LATERAL ABDOMEN AT THE UMBILICAL  LEVEL). There is no rebound and no guarding.  Musculoskeletal: She exhibits no edema or tenderness.  Lymphadenopathy:    She has no cervical adenopathy.  Neurological: She is alert and oriented  to person, place, and time. No cranial nerve deficit.  Skin: Skin is warm and dry. No rash noted. She is not diaphoretic. No erythema. No pallor.  Some itching around the umbilicus.  Psychiatric: She has a normal mood and affect. Her behavior is normal. Judgment and thought content normal.    Assessment/Plan: Left colon diverticulitis with perforation and small abscess Hypertension  Plan:  Agree with Admit, she already has a perforation with small abscess.  Our goal would be to see her treated Medically with Antibiotics, bowel rest, hydration,  get better, go to orals and then colonoscopy in 6-8 weeks.    She has been placed on Cipro/Flagyl IV, I would at least try her on Zosyn, or with current guide lines Cefepime/flagyl to start treatment.  She is suppose to Move laster this week to a new home.  We will follow with you.      Arie Gable 03/08/2015, 12:11 PM

## 2015-03-08 NOTE — ED Notes (Signed)
Patient transported to CT 

## 2015-03-08 NOTE — ED Notes (Signed)
PT CAN GO UP AT 15:27. 

## 2015-03-08 NOTE — ED Notes (Signed)
Pt is aware of urine sample 

## 2015-03-08 NOTE — ED Notes (Signed)
MD at bedside. 

## 2015-03-08 NOTE — Progress Notes (Signed)
ANTIBIOTIC CONSULT NOTE - INITIAL  Pharmacy Consult for Zosyn Indication: Intra-abdominal Infection  Allergies  Allergen Reactions  . Citrus Rash  . Codeine Rash  . Pollen Extract     sneezing    Patient Measurements:   Last documented weight 93.4 kg (01/13/15)  Vital Signs: Temp: 98.9 F (37.2 C) (01/16 0714) BP: 165/105 mmHg (01/16 1357) Pulse Rate: 73 (01/16 1357) Intake/Output from previous day:   Intake/Output from this shift:    Labs:  Recent Labs  03/08/15 0751  WBC 9.4  HGB 12.4  PLT 358  CREATININE 0.84   CrCl cannot be calculated (Unknown ideal weight.). No results for input(s): VANCOTROUGH, VANCOPEAK, VANCORANDOM, GENTTROUGH, GENTPEAK, GENTRANDOM, TOBRATROUGH, TOBRAPEAK, TOBRARND, AMIKACINPEAK, AMIKACINTROU, AMIKACIN in the last 72 hours.   Microbiology: No results found for this or any previous visit (from the past 720 hour(s)).  Medical History: Past Medical History  Diagnosis Date  . Hypertension     dx age 50  . Menopause     LMP age 50  . Contraception     menopausal  . Chronic knee pain     Assessment: [age] yoF presented to ED on 1/16 with first episode of acute diverticulitis and CT which shows small abscess.  Pharmacy is consulted to dose Zosyn for intra abdominal infection.  Today, 03/08/2015:  Tmax 98.9  WBC 9.4  SCr 0.84   Antimicrobials this admission: 1/16 >> Cipro x1 dose 1/16 >> metronidazole x1 dose 1/16 >> Zosyn >>  Levels/dose changes this admission: None  Microbiology Results: None  Goal of Therapy:  Appropriate abx dosing, eradication of infection.   Plan:   Zosyn 3.375g IV Q8H infused over 4hrs.  Follow up renal fxn, culture results, and clinical course.  Lynann Beaverhristine Blaise Grieshaber PharmD, BCPS Pager [phone] 03/08/2015 2:53 PM

## 2015-03-08 NOTE — ED Notes (Signed)
Pt reports left abdominal pain onset Tuesday; denies n/v/d; denies GU complaint; last BM yesterday.

## 2015-03-08 NOTE — ED Notes (Addendum)
MD at bedside. 

## 2015-03-09 LAB — COMPREHENSIVE METABOLIC PANEL
ALBUMIN: 3.5 g/dL (ref 3.5–5.0)
ALT: 23 U/L (ref 14–54)
ANION GAP: 13 (ref 5–15)
AST: 20 U/L (ref 15–41)
Alkaline Phosphatase: 82 U/L (ref 38–126)
BILIRUBIN TOTAL: 1.1 mg/dL (ref 0.3–1.2)
BUN: 10 mg/dL (ref 6–20)
CALCIUM: 9 mg/dL (ref 8.9–10.3)
CO2: 25 mmol/L (ref 22–32)
CREATININE: 0.86 mg/dL (ref 0.44–1.00)
Chloride: 102 mmol/L (ref 101–111)
GFR calc Af Amer: >60 mL/min (ref >60)
GFR calc non Af Amer: >60 mL/min (ref >60)
GLUCOSE: 109 mg/dL — AB (ref 65–99)
Potassium: 3.4 mmol/L — ABNORMAL LOW (ref 3.5–5.1)
SODIUM: 140 mmol/L (ref 135–145)
TOTAL PROTEIN: 7.2 g/dL (ref 6.5–8.1)

## 2015-03-09 LAB — CBC
HCT: 36.7 % (ref 36.0–46.0)
Hemoglobin: 12.1 g/dL (ref 12.0–15.0)
MCH: 30.3 pg (ref 26.0–34.0)
MCHC: 33 g/dL (ref 30.0–36.0)
MCV: 91.8 fL (ref 78.0–100.0)
PLATELETS: 364 10*3/uL (ref 150–400)
RBC: 4 MIL/uL (ref 3.87–5.11)
RDW: 12.7 % (ref 11.5–15.5)
WBC: 5.7 10*3/uL (ref 4.0–10.5)

## 2015-03-09 MED ORDER — SODIUM CHLORIDE 0.9 % IV SOLN
INTRAVENOUS | Status: DC
  Administered 2015-03-09 – 2015-03-10 (×3): via INTRAVENOUS
  Filled 2015-03-09 (×7): qty 1000

## 2015-03-09 MED ORDER — DEXTROSE 5 % IV SOLN
1.0000 g | Freq: Two times a day (BID) | INTRAVENOUS | Status: DC
  Administered 2015-03-09 – 2015-03-11 (×5): 1 g via INTRAVENOUS
  Filled 2015-03-09 (×5): qty 1

## 2015-03-09 MED ORDER — CARVEDILOL 12.5 MG PO TABS
12.5000 mg | ORAL_TABLET | Freq: Two times a day (BID) | ORAL | Status: DC
  Administered 2015-03-09 – 2015-03-11 (×4): 12.5 mg via ORAL
  Filled 2015-03-09 (×6): qty 1

## 2015-03-09 MED ORDER — METRONIDAZOLE IN NACL 5-0.79 MG/ML-% IV SOLN
500.0000 mg | Freq: Three times a day (TID) | INTRAVENOUS | Status: DC
  Administered 2015-03-09 – 2015-03-11 (×6): 500 mg via INTRAVENOUS
  Filled 2015-03-09 (×7): qty 100

## 2015-03-09 NOTE — Care Management Note (Signed)
Case Management Note  Patient Details  Name: Sara Norris MRN: [id number] Date of Birth: October 15, 1965  Subjective/Objective:     Admitted with Acute diverticulitis, Intra-abdominal abscess [facility])               Action/Plan: Discharge planning, no HH needs identified at this time  Expected Discharge Date:   (unknown)               Expected Discharge Plan:  Home/Self Care  In-House Referral:  NA  Discharge planning Services  NA  Post Acute Care Choice:  NA Choice offered to:  NA  DME Arranged:  N/A DME Agency:  NA  HH Arranged:  NA HH Agency:  NA  Status of Service:  In process, will continue to follow  Medicare Important Message Given:    Date Medicare IM Given:    Medicare IM give by:    Date Additional Medicare IM Given:    Additional Medicare Important Message give by:     If discussed at Long Length of Stay Meetings, dates discussed:    Additional Comments:  Alexis Goodell, RN 03/09/2015, 11:03 AM

## 2015-03-09 NOTE — Progress Notes (Signed)
[facility] PROGRESS NOTE  Sara Norris ZOX:[id number] DOB: 08-Sep-1965 DOA: 03/08/2015  PCP: Willow Ora, MD  Brief HPI: 50 year old African-American female with a past medical history of hypertension, presented with left-sided abdominal pain. She was found to have diverticulosis with an intra-abdominal abscess. She was hospitalized for further management.  Past medical history:  Past Medical History  Diagnosis Date  . Hypertension     dx age 28  . Menopause     LMP age 22  . Contraception     menopausal  . Chronic knee pain     Consultants: Gen. surgery  Procedures: None  Antibiotics: She was given Cipro and Flagyl in the ED. Then she was given Zosyn.   General surgery prefers cefepime and Flagyl.  Subjective: Patient states the pain is slightly better. 4/10 in intensity. No nausea, vomiting.  Objective: Vital Signs  Filed Vitals:   03/08/15 2125 03/09/15 0543 03/09/15 1438 03/09/15 1459  BP: 160/89 158/89 182/98   Pulse: 91 78 99   Temp: 99.8 F (37.7 C) 99.9 F (37.7 C) 100.9 F (38.3 C) 99.4 F (37.4 C)  TempSrc: Oral Oral Oral   Resp: Height:      Weight:      SpO2: 97% 98% 99%     Intake/Output Summary (Last 24 hours) at 03/09/15 1620 Last data filed at 03/09/15 1459  Gross per 24 hour  Intake 796.33 ml  Output    550 ml  Net 246.33 ml   Filed Weights   03/08/15 1537  Weight: 92.534 kg (204 lb)    General appearance: alert, cooperative, appears stated age and no distress Resp: clear to auscultation bilaterally Cardio: regular rate and rhythm, S1, S2 normal, no murmur, click, rub or gallop GI: soft, non-tender today; bowel sounds normal; no masses,  no organomegaly Extremities: extremities normal, atraumatic, no cyanosis or edema Neurologic: Alert and oriented X 3, normal strength and tone. Normal symmetric reflexes. Normal coordination and gait  Lab Results:  Basic Metabolic Panel:  Recent Labs Lab 03/08/15 0751  03/09/15 0350  NA 139 140  K 3.6 3.4*  CL 101 102  CO2 27 25  GLUCOSE 116* 109*  BUN 15 10  CREATININE 0.84 0.86  CALCIUM 9.2 9.0   Liver Function Tests:  Recent Labs Lab 03/08/15 0751 03/09/15 0350  AST 17 20  ALT 19 23  ALKPHOS 82 82  BILITOT 1.0 1.1  PROT 7.7 7.2  ALBUMIN 3.9 3.5    Recent Labs Lab 03/08/15 0751  LIPASE 24   CBC:  Recent Labs Lab 03/08/15 0751 03/09/15 0350  WBC 9.4 5.7  HGB 12.4 12.1  HCT 38.1 36.7  MCV 90.3 91.8  PLT 358 364     Recent Results (from the past 240 hour(s))  Culture, Urine     Status: None (Preliminary result)   Collection Time: 03/08/15  6:35 PM  Result Value Ref Range Status   Specimen Description URINE, CLEAN CATCH  Final   Special Requests NONE  Final   Culture   Final    >=100,000 COLONIES/mL GRAM NEGATIVE RODS Performed at [facility]    Report Status PENDING  Incomplete      Studies/Results: Ct Abdomen Pelvis W Contrast  03/08/2015  ADDENDUM REPORT: 03/08/2015 11:01 ADDENDUM: This addendum is given for the purpose of correcting the impression in the initially dictated report. The first impression should read as follows: Diverticulitis of the distal descending colon with associated abscess  measuring 2.5 cm in diameter. Electronically Signed   By: Drusilla Kanner M.D.   On: 03/08/2015 11:01  03/08/2015  CLINICAL DATA:  Left side abdominal pain since 03/02/2015. No known injury. Initial encounter. EXAM: CT ABDOMEN AND PELVIS WITH CONTRAST TECHNIQUE: Multidetector CT imaging of the abdomen and pelvis was performed using the standard protocol following bolus administration of intravenous contrast. CONTRAST:  100 mL OMNIPAQUE IOHEXOL 300 MG/ML  SOLN COMPARISON:  None. FINDINGS: There is some dependent atelectasis in the lung bases. No pleural or pericardial effusion. Heart size is normal. Small hiatal hernia is noted. The patient is status post cholecystectomy. The liver, spleen, adrenal glands, pancreas and  right kidney are unremarkable. Small cyst in the upper pole of the left kidney is noted. There is extensive stranding about the distal descending colon at its junction with the sigmoid most consistent with diverticulitis. An associated abscess is seen measuring 2.5 cm AP x 2.5 cm transverse by 2.4 cm craniocaudal. The colon is otherwise unremarkable in appearance. The small bowel and appendix appear normal. There is no lymphadenopathy. Small fat containing umbilical hernia is noted. Uterus, adnexa and urinary bladder appear normal. No focal bony abnormality. IMPRESSION: Diverticulosis of the distal descending colon with an associated abscess measuring 2.5 cm in diameter. Small hiatal hernia. Small fat containing umbilical hernia. Status post cholecystectomy. Electronically Signed: By: Drusilla Kanner M.D. On: 03/08/2015 09:55    Medications:  Scheduled: . carvedilol  6.25 mg Oral BID WC  . ceFEPime (MAXIPIME) IV  1 g Intravenous Q12H  . enoxaparin (LOVENOX) injection  40 mg Subcutaneous Q24H  . metronidazole  500 mg Intravenous Q8H   Continuous: . sodium chloride 0.9 % 1,000 mL with potassium chloride 40 mEq infusion 125 mL/hr at 03/09/15 1030   ZOX:[id number] **OR** acetaminophen, albuterol, hydrALAZINE, morphine injection, ondansetron **OR** ondansetron (ZOFRAN) IV, oxyCODONE  Assessment/Plan:  Active Problems:   Essential hypertension   Diverticulitis   Acute diverticulitis   Intra-abdominal abscess (HCC)    Acute Diverticulitis with abscess Patient is now on cefepime and Flagyl. General surgery is following. Pain is well controlled. Abdomen is benign. Continue to monitor for now.   History of essential hypertension Blood Pressure is poorly controlled. Increase dose of carvedilol. Continue hydralazine as needed.  Abnormal UA Urine culture is growing gram-negative rods. Await final identification and sensitivities. Antibiotics as above.  Hypokalemia  Will be repleted.     DVT Prophylaxis: Lovenox    Code Status: Full code  Family Communication: Discussed with the patient  Disposition Plan: Await improvement. Mobilize. Anticipate discharge before end of week.     LOS: 1 day   [facility]  [facility] Pager [phone] 03/09/2015, 4:20 PM  If 7PM-7AM, please contact night-coverage at [email or web address], password [facility]

## 2015-03-09 NOTE — Progress Notes (Signed)
Subjective: Alert.  Stable.  Pleasant.  Cooperative. Generally feeling better.  Still has pain but much less severe.  No nausea.  Says she had a small bowel movement late last night.  She is hungry.  Afebrile.  Heart rate 78. WBC 5700.  1112.1.  Potassium 3.4.  Creatinine 0.86.  Glucose 109.  Objective: Vital signs in last 24 hours: Temp:  [98.9 F (37.2 C)-100 F (37.8 C)] 99.9 F (37.7 C) (01/17 0543) Pulse Rate:  [73-91] 78 (01/17 0543) Resp:  [15-19] 16 (01/17 0543) BP: (157-188)/(89-107) 158/89 mmHg (01/17 0543) SpO2:  [97 %-100 %] 98 % (01/17 0543) Weight:  [92.534 kg (204 lb)] 92.534 kg (204 lb) (01/16 1537) Last BM Date: 03/08/15  Intake/Output from previous day: 01/16 0701 - 01/17 0700 In: 138.3 [I.V.:88.3; IV Piggyback:50] Out: 0  Intake/Output this shift:    General appearance: Alert.  Does not appear to be in any distress.  Moving about in bed well.  Has ambulated to bathroom. Resp: clear to auscultation bilaterally GI: Soft.  Nondistended.  Localized tenderness lower left flank.  No mass.  Lab Results:   Recent Labs  03/08/15 0751 03/09/15 0350  WBC 9.4 5.7  HGB 12.4 12.1  HCT 38.1 36.7  PLT 358 364   BMET  Recent Labs  03/08/15 0751 03/09/15 0350  NA 139 140  K 3.6 3.4*  CL 101 102  CO2 27 25  GLUCOSE 116* 109*  BUN 15 10  CREATININE 0.84 0.86  CALCIUM 9.2 9.0   PT/INR No results for input(s): LABPROT, INR in the last 72 hours. ABG No results for input(s): PHART, HCO3 in the last 72 hours.  Invalid input(s): PCO2, PO2  Studies/Results: Ct Abdomen Pelvis W Contrast  03/08/2015  ADDENDUM REPORT: 03/08/2015 11:01 ADDENDUM: This addendum is given for the purpose of correcting the impression in the initially dictated report. The first impression should read as follows: Diverticulitis of the distal descending colon with associated abscess measuring 2.5 cm in diameter. Electronically Signed   By: Drusilla Kanner M.D.   On: 03/08/2015  11:01  03/08/2015  CLINICAL DATA:  Left side abdominal pain since 03/02/2015. No known injury. Initial encounter. EXAM: CT ABDOMEN AND PELVIS WITH CONTRAST TECHNIQUE: Multidetector CT imaging of the abdomen and pelvis was performed using the standard protocol following bolus administration of intravenous contrast. CONTRAST:  100 mL OMNIPAQUE IOHEXOL 300 MG/ML  SOLN COMPARISON:  None. FINDINGS: There is some dependent atelectasis in the lung bases. No pleural or pericardial effusion. Heart size is normal. Small hiatal hernia is noted. The patient is status post cholecystectomy. The liver, spleen, adrenal glands, pancreas and right kidney are unremarkable. Small cyst in the upper pole of the left kidney is noted. There is extensive stranding about the distal descending colon at its junction with the sigmoid most consistent with diverticulitis. An associated abscess is seen measuring 2.5 cm AP x 2.5 cm transverse by 2.4 cm craniocaudal. The colon is otherwise unremarkable in appearance. The small bowel and appendix appear normal. There is no lymphadenopathy. Small fat containing umbilical hernia is noted. Uterus, adnexa and urinary bladder appear normal. No focal bony abnormality. IMPRESSION: Diverticulosis of the distal descending colon with an associated abscess measuring 2.5 cm in diameter. Small hiatal hernia. Small fat containing umbilical hernia. Status post cholecystectomy. Electronically Signed: By: Drusilla Kanner M.D. On: 03/08/2015 09:55    Anti-infectives: Anti-infectives    Start     Dose/Rate Route Frequency Ordered Stop   03/08/15 2200  piperacillin-tazobactam (ZOSYN) IVPB 3.375 g     3.375 g 12.5 mL/hr over 240 Minutes Intravenous Every 8 hours 03/08/15 1455     03/08/15 1445  piperacillin-tazobactam (ZOSYN) IVPB 3.375 g     3.375 g 100 mL/hr over 30 Minutes Intravenous STAT 03/08/15 1439 03/08/15 1705   03/08/15 1100  ciprofloxacin (CIPRO) IVPB 400 mg     400 mg 200 mL/hr over 60  Minutes Intravenous  Once 03/08/15 1058 03/08/15 1217   03/08/15 1100  metroNIDAZOLE (FLAGYL) IVPB 500 mg     500 mg 100 mL/hr over 60 Minutes Intravenous  Once 03/08/15 1058 03/08/15 1216      Assessment/Plan:  Acute diverticulitis with small abscess.  Stable and seems to be improving Continue to treat with antibiotics.  Continue Flagyl.  Discontinue Cipro.  Start cefepime per guidelines Hold off on percutaneous drainage for now.  The abscess is small and might respond to antibiotics alone.  Follow-up CT in a few days. Allow clear liquids If she continues to improve, would plan a 14 day course of antibiotics, outpatient colonoscopy in 8 weeks.  Hypertension    LOS: 1 day    Elih Mooney M 03/09/2015

## 2015-03-10 LAB — CBC
HCT: 36.1 % (ref 36.0–46.0)
Hemoglobin: 11.9 g/dL — ABNORMAL LOW (ref 12.0–15.0)
MCH: 30.4 pg (ref 26.0–34.0)
MCHC: 33 g/dL (ref 30.0–36.0)
MCV: 92.1 fL (ref 78.0–100.0)
PLATELETS: 353 10*3/uL (ref 150–400)
RBC: 3.92 MIL/uL (ref 3.87–5.11)
RDW: 12.8 % (ref 11.5–15.5)
WBC: 4.8 10*3/uL (ref 4.0–10.5)

## 2015-03-10 LAB — URINE CULTURE

## 2015-03-10 LAB — BASIC METABOLIC PANEL
Anion gap: 12 (ref 5–15)
BUN: 8 mg/dL (ref 6–20)
CO2: 23 mmol/L (ref 22–32)
CREATININE: 0.78 mg/dL (ref 0.44–1.00)
Calcium: 9 mg/dL (ref 8.9–10.3)
Chloride: 105 mmol/L (ref 101–111)
Glucose, Bld: 86 mg/dL (ref 65–99)
POTASSIUM: 3.8 mmol/L (ref 3.5–5.1)
SODIUM: 140 mmol/L (ref 135–145)

## 2015-03-10 NOTE — Progress Notes (Signed)
Subjective: Feeling much better.  Pain has essentially resolved.  Tolerating clear liquids.  States she had a bowel movement. Temperature 100.9 yesterday.  Afebrile since. WBC 4800.  Potassium 3.8.  Creatinine 0.78.  Objective: Vital signs in last 24 hours: Temp:  [98.4 F (36.9 C)-100.9 F (38.3 C)] 98.4 F (36.9 C) (01/18 0508) Pulse Rate:  [80-99] 80 (01/18 0508) Resp:  [16-18] 16 (01/18 0508) BP: (147-182)/(94-103) 150/94 mmHg (01/18 0508) SpO2:  [97 %-99 %] 98 % (01/18 0508) Last BM Date: 03/08/15  Intake/Output from previous day: 01/17 0701 - 01/18 0700 In: 2973 [P.O.:958; I.V.:1715; IV Piggyback:300] Out: 2000 [Urine:2000] Intake/Output this shift: Total I/O In: 1100 [I.V.:800; IV Piggyback:300] Out: 1450 [Urine:1450]  General appearance: No distress.  Very pleasant and cooperative.  Alert and oriented. Resp: clear to auscultation bilaterally GI: Soft.  Nondistended.  Nontender today.  No tenderness or mass in left lower quadrant.  This is improved compared to yesterday  Lab Results:   Recent Labs  03/09/15 0350 03/10/15 0510  WBC 5.7 4.8  HGB 12.1 11.9*  HCT 36.7 36.1  PLT 364 353   BMET  Recent Labs  03/09/15 0350 03/10/15 0510  NA 140 140  K 3.4* 3.8  CL 102 105  CO2 25 23  GLUCOSE 109* 86  BUN 10 8  CREATININE 0.86 0.78  CALCIUM 9.0 9.0   PT/INR No results for input(s): LABPROT, INR in the last 72 hours. ABG No results for input(s): PHART, HCO3 in the last 72 hours.  Invalid input(s): PCO2, PO2  Studies/Results: Ct Abdomen Pelvis W Contrast  03/08/2015  ADDENDUM REPORT: 03/08/2015 11:01 ADDENDUM: This addendum is given for the purpose of correcting the impression in the initially dictated report. The first impression should read as follows: Diverticulitis of the distal descending colon with associated abscess measuring 2.5 cm in diameter. Electronically Signed   By: Drusilla Kanner M.D.   On: 03/08/2015 11:01  03/08/2015  CLINICAL  DATA:  Left side abdominal pain since 03/02/2015. No known injury. Initial encounter. EXAM: CT ABDOMEN AND PELVIS WITH CONTRAST TECHNIQUE: Multidetector CT imaging of the abdomen and pelvis was performed using the standard protocol following bolus administration of intravenous contrast. CONTRAST:  100 mL OMNIPAQUE IOHEXOL 300 MG/ML  SOLN COMPARISON:  None. FINDINGS: There is some dependent atelectasis in the lung bases. No pleural or pericardial effusion. Heart size is normal. Small hiatal hernia is noted. The patient is status post cholecystectomy. The liver, spleen, adrenal glands, pancreas and right kidney are unremarkable. Small cyst in the upper pole of the left kidney is noted. There is extensive stranding about the distal descending colon at its junction with the sigmoid most consistent with diverticulitis. An associated abscess is seen measuring 2.5 cm AP x 2.5 cm transverse by 2.4 cm craniocaudal. The colon is otherwise unremarkable in appearance. The small bowel and appendix appear normal. There is no lymphadenopathy. Small fat containing umbilical hernia is noted. Uterus, adnexa and urinary bladder appear normal. No focal bony abnormality. IMPRESSION: Diverticulosis of the distal descending colon with an associated abscess measuring 2.5 cm in diameter. Small hiatal hernia. Small fat containing umbilical hernia. Status post cholecystectomy. Electronically Signed: By: Drusilla Kanner M.D. On: 03/08/2015 09:55    Anti-infectives: Anti-infectives    Start     Dose/Rate Route Frequency Ordered Stop   03/09/15 1200  metroNIDAZOLE (FLAGYL) IVPB 500 mg     500 mg 100 mL/hr over 60 Minutes Intravenous Every 8 hours 03/09/15 1037  03/09/15 1000  ceFEPIme (MAXIPIME) 1 g in dextrose 5 % 50 mL IVPB     1 g 100 mL/hr over 30 Minutes Intravenous Every 12 hours 03/09/15 0621     03/08/15 2200  piperacillin-tazobactam (ZOSYN) IVPB 3.375 g  Status:  Discontinued     3.375 g 12.5 mL/hr over 240 Minutes  Intravenous Every 8 hours 03/08/15 1455 03/09/15 0621   03/08/15 1445  piperacillin-tazobactam (ZOSYN) IVPB 3.375 g     3.375 g 100 mL/hr over 30 Minutes Intravenous STAT 03/08/15 1439 03/08/15 1705   03/08/15 1100  ciprofloxacin (CIPRO) IVPB 400 mg     400 mg 200 mL/hr over 60 Minutes Intravenous  Once 03/08/15 1058 03/08/15 1217   03/08/15 1100  metroNIDAZOLE (FLAGYL) IVPB 500 mg     500 mg 100 mL/hr over 60 Minutes Intravenous  Once 03/08/15 1058 03/08/15 1216      Assessment/Plan:   Acute diverticulitis with small abscess. Stable and improving Continue to treat with antibiotics. Advance to full liquids Possibly discharge 24-48 hours if continues to improve CBC tomorrow  Follow-up CT in a few days will be considered, depending on clinical course.  If she does very well this may not be necessary  If she continues to improve, would plan a 14 day course of antibiotics, outpatient colonoscopy in 8 weeks.  Hypertension  VTE prophylaxis- on Lovenox     LOS: 2 days    Gerene Nedd M 03/10/2015

## 2015-03-10 NOTE — Progress Notes (Signed)
Patient ID: Sara Norris, female   DOB: 15-Jun-1965, 50 y.o.   MRN: [id number] [facility] PRO[facility]ESS NOTE  Sara Norris WUJ:[id number] DOB: 02-24-1965 DOA: 03/08/2015 PCP: Willow Ora, MD   Brief narrative:    50 year old female with a past medical history of hypertension, presented with left-sided abdominal pain. She was found to have diverticulitis with an intra-abdominal abscess. She was hospitalized for further management  Assessment/Plan:    Acute Diverticulitis with abscess - continues to improve, advance diet as pt able to tolerate  - continue ABX day #3/14   History of essential hypertension - continue coreg as per home medical regimen - added hydralazine as needed   Abnormal UA - E. Coli in urine culture - current ABX should be adequate   Hypokalemia  - supplemented and WNL   DVT prophylaxis - Lovenox SQ  Code Status: Full.  Family Communication:  plan of care discussed with the patient Disposition Plan: Home by 1/20  IV access:  Peripheral IV  Procedures and diagnostic studies:    Ct Abdomen Pelvis W Contrast 03/08/2015  Diverticulosis of the distal descending colon with an associated abscess measuring 2.5 cm in diameter. Small hiatal hernia. Small fat containing umbilical hernia. Status post cholecystectomy.   Medical Consultants:  Surgery   Other Consultants:  None  IAnti-Infectives:   Flagyl 1/16--> Maxipime 1/16 -->  Debbora Presto, MD  [facility] Pager [phone]  If 7PM-7AM, please contact night-coverage [email or web address] Password [facility] 03/10/2015, 11:34 AM   LOS: 2 days   HPI/Subjective: No events overnight.   Objective: Filed Vitals:   03/09/15 1438 03/09/15 1459 03/09/15 2129 03/10/15 0508  BP: 182/98  147/103 150/94  Pulse: 99  84 80  Temp: 100.9 F (38.3 C) 99.4 F (37.4 C) 99 F (37.2 C) 98.4 F (36.9 C)  TempSrc: Oral  Oral Oral  Resp: Height:      Weight:      SpO2: 99%  97% 98%    Intake/Output Summary (Last  24 hours) at 03/10/15 1134 Last data filed at 03/10/15 8657  Gross per 24 hour  Intake   2615 ml  Output   1750 ml  Net    865 ml    Exam:   General:  Pt is alert, follows commands appropriately, not in acute distress  Cardiovascular: Regular rate and rhythm, S1/S2, no murmurs, no rubs, no gallops  Respiratory: Clear to auscultation bilaterally, no wheezing, no crackles, no rhonchi  Abdomen: Soft, non tender, non distended, bowel sounds present, no guarding  Data Reviewed: Basic Metabolic Panel:  Recent Labs Lab 03/08/15 0751 03/09/15 0350 03/10/15 0510  NA 139 140 140  K 3.6 3.4* 3.8  CL 101 102 105  CO2 GLUCOSE 116* 109* 86  BUN CREATININE 0.84 0.86 0.78  CALCIUM 9.2 9.0 9.0   Liver Function Tests:  Recent Labs Lab 03/08/15 0751 03/09/15 0350  AST 17 20  ALT 19 23  ALKPHOS 82 82  BILITOT 1.0 1.1  PROT 7.7 7.2  ALBUMIN 3.9 3.5    Recent Labs Lab 03/08/15 0751  LIPASE 24   CBC:  Recent Labs Lab 03/08/15 0751 03/09/15 0350 03/10/15 0510  WBC 9.4 5.7 4.8  HGB 12.4 12.1 11.9*  HCT 38.1 36.7 36.1  MCV 90.3 91.8 92.1  PLT 358 364 353    Recent Results (from the past 240 hour(s))  Culture, Urine     Status: None  Collection Time: 03/08/15  6:35 PM  Result Value Ref Range Status   Specimen Description URINE, CLEAN CATCH  Final   Special Requests NONE  Final   Culture   Final    >=100,000 COLONIES/mL ESCHERICHIA COLI Performed at [facility]    Report Status 03/10/2015 FINAL  Final   Organism ID, Bacteria ESCHERICHIA COLI  Final      Susceptibility   Escherichia coli - MIC*    AMPICILLIN 4 SENSITIVE Sensitive     CEFAZOLIN <=4 SENSITIVE Sensitive     CEFTRIAXONE <=1 SENSITIVE Sensitive     CIPROFLOXACIN <=0.25 SENSITIVE Sensitive     GENTAMICIN <=1 SENSITIVE Sensitive     IMIPENEM <=0.25 SENSITIVE Sensitive     NITROFURANTOIN <=16 SENSITIVE Sensitive     TRIMETH/SULFA <=20 SENSITIVE Sensitive      AMPICILLIN/SULBACTAM 4 SENSITIVE Sensitive     PIP/TAZO <=4 SENSITIVE Sensitive     * >=100,000 COLONIES/mL ESCHERICHIA COLI     Scheduled Meds: . carvedilol  12.5 mg Oral BID WC  . ceFEPime (MAXIPIME) IV  1 g Intravenous Q12H  . enoxaparin (LOVENOX) injection  40 mg Subcutaneous Q24H  . metronidazole  500 mg Intravenous Q8H   Continuous Infusions: . sodium chloride 0.9 % 1,000 mL with potassium chloride 40 mEq infusion 100 mL/hr at 03/09/15 2134

## 2015-03-11 LAB — BASIC METABOLIC PANEL
Anion gap: 9 (ref 5–15)
BUN: 8 mg/dL (ref 6–20)
CHLORIDE: 106 mmol/L (ref 101–111)
CO2: 26 mmol/L (ref 22–32)
Calcium: 9.6 mg/dL (ref 8.9–10.3)
Creatinine, Ser: 0.78 mg/dL (ref 0.44–1.00)
GFR calc non Af Amer: >60 mL/min (ref >60)
Glucose, Bld: 101 mg/dL — ABNORMAL HIGH (ref 65–99)
POTASSIUM: 4 mmol/L (ref 3.5–5.1)
SODIUM: 141 mmol/L (ref 135–145)

## 2015-03-11 LAB — CBC WITH DIFFERENTIAL/PLATELET
BASOS ABS: 0.1 10*3/uL (ref 0.0–0.1)
BASOS PCT: 2 %
EOS ABS: 0.3 10*3/uL (ref 0.0–0.7)
Eosinophils Relative: 5 %
HEMATOCRIT: 36.5 % (ref 36.0–46.0)
HEMOGLOBIN: 12 g/dL (ref 12.0–15.0)
LYMPHS PCT: 52 %
Lymphs Abs: 2.5 10*3/uL (ref 0.7–4.0)
MCH: 29.4 pg (ref 26.0–34.0)
MCHC: 32.9 g/dL (ref 30.0–36.0)
MCV: 89.5 fL (ref 78.0–100.0)
MONOS PCT: 15 %
Monocytes Absolute: 0.8 10*3/uL (ref 0.1–1.0)
NEUTROS PCT: 26 %
Neutro Abs: 1.3 10*3/uL — ABNORMAL LOW (ref 1.7–7.7)
Platelets: 377 10*3/uL (ref 150–400)
RBC: 4.08 MIL/uL (ref 3.87–5.11)
RDW: 12.5 % (ref 11.5–15.5)
WBC: 5 10*3/uL (ref 4.0–10.5)

## 2015-03-11 MED ORDER — OXYCODONE HCL 5 MG PO TABS: 5.0000 mg | ORAL_TABLET | ORAL | Status: DC | PRN

## 2015-03-11 MED ORDER — ZOLPIDEM TARTRATE 10 MG PO TABS: 10.0000 mg | ORAL_TABLET | Freq: Every evening | ORAL | Status: DC | PRN

## 2015-03-11 MED ORDER — CIPROFLOXACIN HCL 500 MG PO TABS: 500.0000 mg | ORAL_TABLET | Freq: Two times a day (BID) | ORAL | Status: DC

## 2015-03-11 MED ORDER — ONDANSETRON HCL 4 MG PO TABS: 4.0000 mg | ORAL_TABLET | Freq: Four times a day (QID) | ORAL | Status: DC | PRN

## 2015-03-11 MED ORDER — METRONIDAZOLE 500 MG PO TABS: 500.0000 mg | ORAL_TABLET | Freq: Three times a day (TID) | ORAL | Status: DC

## 2015-03-11 NOTE — Progress Notes (Signed)
All DC instructions reviewed with the pt, all questions and concerns were addressed. Prescriptions reviewed and given to pt. Pt alert and oriented times four, VSS, skin intact, tolerating soft diet, no N/V, pain controlled, no s/s of distress or discomfort at this time. Pt to DC home with family. All personal belongnigs sent with pt.

## 2015-03-11 NOTE — Discharge Summary (Signed)
Physician Discharge Summary  Sara Norris ZOX:[id number] DOB: 06-Jul-1965 DOA: 03/08/2015  PCP: Willow Ora, MD  Admit date: 03/08/2015 Discharge date: 03/11/2015  Recommendations for Outpatient Follow-up:  1. Pt will need to follow up with PCP in 2-3 weeks post discharge 2. Complete therapy with Cipro and Flagyl   Discharge Diagnoses:  Active Problems:   Essential hypertension   Diverticulitis   Acute diverticulitis   Intra-abdominal abscess [facility])  Discharge Condition: Stable  Diet recommendation: as tolerated    Brief narrative:    50 year old female with a past medical history of hypertension, presented with left-sided abdominal pain. She was found to have diverticulitis with an intra-abdominal abscess. She was hospitalized for further management  Assessment/Plan:    Acute Diverticulitis with abscess - continues to improve, advance diet as pt able to tolerate  - continue ABX Cipro and Flagyl for 10 more days post discharge   History of essential hypertension - continue coreg as per home medical regimen - added hydralazine as needed   Abnormal UA - E. Coli in urine culture - current ABX should be adequate   Hypokalemia  - supplemented and WNL   DVT prophylaxis - Lovenox SQ  Code Status: Full.  Family Communication: plan of care discussed with the patient Disposition Plan: Home   IV access:  Peripheral IV  Procedures and diagnostic studies:   Ct Abdomen Pelvis W Contrast 03/08/2015 Diverticulosis of the distal descending colon with an associated abscess measuring 2.5 cm in diameter. Small hiatal hernia. Small fat containing umbilical hernia. Status post cholecystectomy.   Medical Consultants:  Surgery   Other Consultants:  None       Discharge Exam: Filed Vitals:   03/10/15 2051 03/11/15 0537  BP: 162/80 172/90  Pulse: 73 77  Temp:  98.8 F (37.1 C)  Resp:  18   Filed Vitals:   03/10/15 0508 03/10/15 1400 03/10/15 2051  03/11/15 0537  BP: 150/94 160/92 162/80 172/90  Pulse: 80 74 73 77  Temp: 98.4 F (36.9 C) 98.9 F (37.2 C)  98.8 F (37.1 C)  TempSrc: Oral Oral Oral Oral  Resp: Height:      Weight:      SpO2: 98% 100% 99% 100%    General: Pt is alert, follows commands appropriately, not in acute distress Cardiovascular: Regular rate and rhythm, S1/S2 +, no murmurs, no rubs, no gallops Respiratory: Clear to auscultation bilaterally, no wheezing, no crackles, no rhonchi Abdominal: Soft, non tender, non distended, bowel sounds +, no guarding Extremities: no edema, no cyanosis, pulses palpable bilaterally DP and PT Neuro: Grossly nonfocal  Discharge Instructions  Discharge Instructions    Diet - low sodium heart healthy    Complete by:  As directed      Increase activity slowly    Complete by:  As directed             Medication List    STOP taking these medications        ibuprofen 200 MG tablet  Commonly known as:  ADVIL,MOTRIN      TAKE these medications        aspirin EC 81 MG tablet  Take 81 mg by mouth daily.     carvedilol 6.25 MG tablet  Commonly known as:  COREG  Take 1 tablet by mouth twice daily.     ciprofloxacin 500 MG tablet  Commonly known as:  CIPRO  Take 1 tablet (500 mg total) by mouth 2 (two)  times daily.     diphenhydrAMINE 50 MG capsule  Commonly known as:  BENADRYL  Take 50 mg by mouth every 8 (eight) hours as needed for allergies. Reported on 03/08/2015     EPINEPHrine 0.3 mg/0.3 mL Soaj injection  Commonly known as:  EPI-PEN  Inject 0.3 mLs (0.3 mg total) into the muscle once.     losartan-hydrochlorothiazide 100-12.5 MG tablet  Commonly known as:  HYZAAR  TAKE 1 TABLET BY MOUTH DAILY.     magnesium citrate Soln  Take 1 Bottle by mouth once.     metroNIDAZOLE 500 MG tablet  Commonly known as:  FLAGYL  Take 1 tablet (500 mg total) by mouth 3 (three) times daily.     ondansetron 4 MG tablet  Commonly known as:  ZOFRAN  Take 1  tablet (4 mg total) by mouth every 6 (six) hours as needed for nausea.     oxyCODONE 5 MG immediate release tablet  Commonly known as:  Oxy IR/ROXICODONE  Take 1 tablet (5 mg total) by mouth every 4 (four) hours as needed for moderate pain.     potassium chloride 10 MEQ tablet  Commonly known as:  K-DUR,KLOR-CON  Take 1 tablet daily.     zolpidem 10 MG tablet  Commonly known as:  AMBIEN  Take 1 tablet (10 mg total) by mouth at bedtime as needed for sleep.           Follow-up Information    Follow up with Willow Ora, MD.   Specialty:  Internal Medicine   Contact information:   [address] [place] [address] [phone]        The results of significant diagnostics from this hospitalization (including imaging, microbiology, ancillary and laboratory) are listed below for reference.     Microbiology: Recent Results (from the past 240 hour(s))  Culture, Urine     Status: None   Collection Time: 03/08/15  6:35 PM  Result Value Ref Range Status   Specimen Description URINE, CLEAN CATCH  Final   Special Requests NONE  Final   Culture   Final    >=100,000 COLONIES/mL ESCHERICHIA COLI Performed at [facility]    Report Status 03/10/2015 FINAL  Final   Organism ID, Bacteria ESCHERICHIA COLI  Final      Susceptibility   Escherichia coli - MIC*    AMPICILLIN 4 SENSITIVE Sensitive     CEFAZOLIN <=4 SENSITIVE Sensitive     CEFTRIAXONE <=1 SENSITIVE Sensitive     CIPROFLOXACIN <=0.25 SENSITIVE Sensitive     GENTAMICIN <=1 SENSITIVE Sensitive     IMIPENEM <=0.25 SENSITIVE Sensitive     NITROFURANTOIN <=16 SENSITIVE Sensitive     TRIMETH/SULFA <=20 SENSITIVE Sensitive     AMPICILLIN/SULBACTAM 4 SENSITIVE Sensitive     PIP/TAZO <=4 SENSITIVE Sensitive     * >=100,000 COLONIES/mL ESCHERICHIA COLI     Labs: Basic Metabolic Panel:  Recent Labs Lab 03/08/15 0751 03/09/15 0350 03/10/15 0510 03/11/15 0357  NA 139 140 140 141  K 3.6 3.4* 3.8 4.0   CL 101 102 105 106  CO2 GLUCOSE 116* 109* 86 101*  BUN CREATININE 0.84 0.86 0.78 0.78  CALCIUM 9.2 9.0 9.0 9.6   Liver Function Tests:  Recent Labs Lab 03/08/15 0751 03/09/15 0350  AST 17 20  ALT 19 23  ALKPHOS 82 82  BILITOT 1.0 1.1  PROT 7.7 7.2  ALBUMIN 3.9  3.5    Recent Labs Lab 03/08/15 0751  LIPASE 24   No results for input(s): AMMONIA in the last 168 hours. CBC:  Recent Labs Lab 03/08/15 0751 03/09/15 0350 03/10/15 0510 03/11/15 0357  WBC 9.4 5.7 4.8 5.0  NEUTROABS  --   --   --  1.3*  HGB 12.4 12.1 11.9* 12.0  HCT 38.1 36.7 36.1 36.5  MCV 90.3 91.8 92.1 89.5  PLT 358 364 353 377     SIGNED: Time coordinating discharge: 30 minutes  MAGICK-Corlette Ciano, MD  [facility] 03/11/2015, 9:35 AM Pager [phone]  If 7PM-7AM, please contact night-coverage [email or web address] Password [email or web address]

## 2015-03-11 NOTE — Progress Notes (Addendum)
Gen. Surgery:  Patient is doing very well.  Tolerating full liquids.  Having bowel movements.  No pain. Abdomen soft.  Completely nontender.  No mass  We'll transition to soft diet this morning.  We will ask nutritional services to see her regarding diet education If she does well she may be discharged this afternoon. No need for follow-up CT at this point Plan total 14 days of antibiotics Follow-up with me in one month Colonoscopy in 2 months   Jadiel Schmieder M. Derrell Lolling, M.D., [facility] General and Minimally invasive Surgery Breast and Colorectal Surgery Office:   [phone]

## 2015-03-11 NOTE — Discharge Instructions (Signed)
Diverticulitis Diverticulitis is inflammation or infection of small pouches in your colon that form when you have a condition called diverticulosis. The pouches in your colon are called diverticula. Your colon, or large intestine, is where water is absorbed and stool is formed. Complications of diverticulitis can include:  Bleeding.  Severe infection.  Severe pain.  Perforation of your colon.  Obstruction of your colon. CAUSES  Diverticulitis is caused by bacteria. Diverticulitis happens when stool becomes trapped in diverticula. This allows bacteria to grow in the diverticula, which can lead to inflammation and infection. RISK FACTORS People with diverticulosis are at risk for diverticulitis. Eating a diet that does not include enough fiber from fruits and vegetables may make diverticulitis more likely to develop. SYMPTOMS  Symptoms of diverticulitis may include:  Abdominal pain and tenderness. The pain is normally located on the left side of the abdomen, but may occur in other areas.  Fever and chills.  Bloating.  Cramping.  Nausea.  Vomiting.  Constipation.  Diarrhea.  Blood in your stool. DIAGNOSIS  Your health care provider will ask you about your medical history and do a physical exam. You may need to have tests done because many medical conditions can cause the same symptoms as diverticulitis. Tests may include:  Blood tests.  Urine tests.  Imaging tests of the abdomen, including X-rays and CT scans. When your condition is under control, your health care provider may recommend that you have a colonoscopy. A colonoscopy can show how severe your diverticula are and whether something else is causing your symptoms. TREATMENT  Most cases of diverticulitis are mild and can be treated at home. Treatment may include:  Taking over-the-counter pain medicines.  Following a clear liquid diet.  Taking antibiotic medicines by mouth for 7-10 days. More severe cases may  be treated at a hospital. Treatment may include:  Not eating or drinking.  Taking prescription pain medicine.  Receiving antibiotic medicines through an IV tube.  Receiving fluids and nutrition through an IV tube.  Surgery. HOME CARE INSTRUCTIONS   Follow your health care provider's instructions carefully.  Follow a full liquid diet or other diet as directed by your health care provider. After your symptoms improve, your health care provider may tell you to change your diet. He or she may recommend you eat a high-fiber diet. Fruits and vegetables are good sources of fiber. Fiber makes it easier to pass stool.  Take fiber supplements or probiotics as directed by your health care provider.  Only take medicines as directed by your health care provider.  Keep all your follow-up appointments. SEEK MEDICAL CARE IF:   Your pain does not improve.  You have a hard time eating food.  Your bowel movements do not return to normal. SEEK IMMEDIATE MEDICAL CARE IF:   Your pain becomes worse.  Your symptoms do not get better.  Your symptoms suddenly get worse.  You have a fever.  You have repeated vomiting.  You have bloody or black, tarry stools. MAKE SURE YOU:   Understand these instructions.  Will watch your condition.  Will get help right away if you are not doing well or get worse.   This information is not intended to replace advice given to you by your health care provider. Make sure you discuss any questions you have with your health care provider.   Document Released: 11/16/2004 Document Revised: 02/11/2013 Document Reviewed: 01/01/2013 Elsevier Interactive Patient Education 2016 Elsevier Inc.  Low-Fiber Diet, for 3-4 weeks, then switch to  high fiber diet Fiber is found in fruits, vegetables, and whole grains. A low-fiber diet restricts fibrous foods that are not digested in the small intestine. A diet containing about 10-15 grams of fiber per day is considered low  fiber. Low-fiber diets may be used to:  Promote healing and rest the bowel during intestinal flare-ups.  Prevent blockage of a partially obstructed or narrowed gastrointestinal tract.  Reduce fecal weight and volume.  Slow the movement of feces. You may be on a low-fiber diet as a transitional diet following surgery, after an injury (trauma), or because of a short (acute) or lifelong (chronic) illness. Your health care provider will determine the length of time you need to stay on this diet.  WHAT DO I NEED TO KNOW ABOUT A LOW-FIBER DIET? Always check the fiber content on the packaging's Nutrition Facts label, especially on foods from the grains list. Ask your dietitian if you have questions about specific foods that are related to your condition, especially if the food is not listed below. In general, a low-fiber food will have less than 2 g of fiber. WHAT FOODS CAN I EAT? Grains All breads and crackers made with white flour. Sweet rolls, doughnuts, waffles, pancakes, [place] toast, bagels. Pretzels, Melba toast, zwieback. Well-cooked cereals, such as cornmeal, farina, or cream cereals. Dry cereals that do not contain whole grains, fruit, or nuts, such as refined corn, wheat, rice, and oat cereals. Potatoes prepared any way without skins, plain pastas and noodles, refined white rice. Use white flour for baking and making sauces. Use allowed list of grains for casseroles, dumplings, and puddings.  Vegetables Strained tomato and vegetable juices. Fresh lettuce, cucumber, spinach. Well-cooked (no skin or pulp) or canned vegetables, such as asparagus, bean sprouts, beets, carrots, green beans, mushrooms, potatoes, pumpkin, spinach, yellow squash, tomato sauce/puree, turnips, yams, and zucchini. Keep servings limited to  cup.  Fruits All fruit juices except prune juice. Cooked or canned fruits without skin and seeds, such as applesauce, apricots, cherries, fruit cocktail, grapefruit, grapes, mandarin  oranges, melons, peaches, pears, pineapple, and plums. Fresh fruits without skin, such as apricots, avocados, bananas, melons, pineapple, nectarines, and peaches. Keep servings limited to  cup or 1 piece.  Meat and Other Protein Sources Ground or well-cooked tender beef, ham, veal, lamb, pork, or poultry. Eggs, plain cheese. Fish, oysters, shrimp, lobster, and other seafood. Liver, organ meats. Smooth nut butters. Dairy All milk products and alternative dairy substitutes, such as soy, rice, almond, and coconut, not containing added whole nuts, seeds, or added fruit. Beverages Decaf coffee, fruit, and vegetable juices or smoothies (small amounts, with no pulp or skins, and with fruits from allowed list), sports drinks, herbal tea. Condiments Ketchup, mustard, vinegar, cream sauce, cheese sauce, cocoa powder. Spices in moderation, such as allspice, basil, bay leaves, celery powder or leaves, cinnamon, cumin powder, curry powder, ginger, mace, marjoram, onion or garlic powder, oregano, paprika, parsley flakes, ground pepper, rosemary, sage, savory, tarragon, thyme, and turmeric. Sweets and Desserts Plain cakes and cookies, pie made with allowed fruit, pudding, custard, cream pie. Gelatin, fruit, ice, sherbet, frozen ice pops. Ice cream, ice milk without nuts. Plain hard candy, honey, jelly, molasses, syrup, sugar, chocolate syrup, gumdrops, marshmallows. Limit overall sugar intake.  Fats and Oil Margarine, butter, cream, mayonnaise, salad oils, plain salad dressings made from allowed foods. Choose healthy fats such as olive oil, canola oil, and omega-3 fatty acids (such as found in salmon or tuna) when possible.  Other Bouillon, broth, or cream  soups made from allowed foods. Any strained soup. Casseroles or mixed dishes made with allowed foods. The items listed above may not be a complete list of recommended foods or beverages. Contact your dietitian for more options.  WHAT FOODS ARE NOT  RECOMMENDED? Grains All whole wheat and whole grain breads and crackers. Multigrains, rye, bran seeds, nuts, or coconut. Cereals containing whole grains, multigrains, bran, coconut, nuts, raisins. Cooked or dry oatmeal, steel-cut oats. Coarse wheat cereals, granola. Cereals advertised as high fiber. Potato skins. Whole grain pasta, wild or brown rice. Popcorn. Coconut flour. Bran, buckwheat, corn bread, multigrains, rye, wheat germ.  Vegetables Fresh, cooked or canned vegetables, such as artichokes, asparagus, beet greens, broccoli, Brussels sprouts, cabbage, celery, cauliflower, corn, eggplant, kale, legumes or beans, okra, peas, and tomatoes. Avoid large servings of any vegetables, especially raw vegetables.  Fruits Fresh fruits, such as apples with or without skin, berries, cherries, figs, grapes, grapefruit, guavas, kiwis, mangoes, oranges, papayas, pears, persimmons, pineapple, and pomegranate. Prune juice and juices with pulp, stewed or dried prunes. Dried fruits, dates, raisins. Fruit seeds or skins. Avoid large servings of all fresh fruits. Meats and Other Protein Sources Tough, fibrous meats with gristle. Chunky nut butter. Cheese made with seeds, nuts, or other foods not recommended. Nuts, seeds, legumes (beans, including baked beans), dried peas, beans, lentils.  Dairy Yogurt or cheese that contains nuts, seeds, or added fruit.  Beverages Fruit juices with high pulp, prune juice. Caffeinated coffee and teas.  Condiments Coconut, maple syrup, pickles, olives. Sweets and Desserts Desserts, cookies, or candies that contain nuts or coconut, chunky peanut butter, dried fruits. Jams, preserves with seeds, marmalade. Large amounts of sugar and sweets. Any other dessert made with fruits from the not recommended list.  Other Soups made from vegetables that are not recommended or that contain other foods not recommended.  The items listed above may not be a complete list of foods and beverages to  avoid. Contact your dietitian for more information.   This information is not intended to replace advice given to you by your health care provider. Make sure you discuss any questions you have with your health care provider.   Document Released: 07/29/2001 Document Revised: 02/11/2013 Document Reviewed: 12/30/2012 Elsevier Interactive Patient Education 2016 Elsevier Inc.  High-Fiber Diet Fiber, also called dietary fiber, is a type of carbohydrate found in fruits, vegetables, whole grains, and beans. A high-fiber diet can have many health benefits. Your health care provider may recommend a high-fiber diet to help:  Prevent constipation. Fiber can make your bowel movements more regular.  Lower your cholesterol.  Relieve hemorrhoids, uncomplicated diverticulosis, or irritable bowel syndrome.  Prevent overeating as part of a weight-loss plan.  Prevent heart disease, type 2 diabetes, and certain cancers. WHAT IS MY PLAN? The recommended daily intake of fiber includes:  38 grams for men under age 45.  57 grams for men over age 51.  74 grams for women under age 30.  72 grams for women over age 48. You can get the recommended daily intake of dietary fiber by eating a variety of fruits, vegetables, grains, and beans. Your health care provider may also recommend a fiber supplement if it is not possible to get enough fiber through your diet. WHAT DO I NEED TO KNOW ABOUT A HIGH-FIBER DIET?  Fiber supplements have not been widely studied for their effectiveness, so it is better to get fiber through food sources.  Always check the fiber content on thenutrition facts label of any  prepackaged food. Look for foods that contain at least 5 grams of fiber per serving.  Ask your dietitian if you have questions about specific foods that are related to your condition, especially if those foods are not listed in the following section.  Increase your daily fiber consumption gradually. Increasing your  intake of dietary fiber too quickly may cause bloating, cramping, or gas.  Drink plenty of water. Water helps you to digest fiber. WHAT FOODS CAN I EAT? Grains Whole-grain breads. Multigrain cereal. Oats and oatmeal. Brown rice. Barley. Bulgur wheat. [place]. Bran muffins. Popcorn. Rye wafer crackers. Vegetables Sweet potatoes. Spinach. Kale. Artichokes. Cabbage. Broccoli. Green peas. Carrots. Squash. Fruits Berries. Pears. Apples. Oranges. Avocados. Prunes and raisins. Dried figs. Meats and Other Protein Sources [place], kidney, pinto, and soy beans. Split peas. Lentils. Nuts and seeds. Dairy Fiber-fortified yogurt. Beverages Fiber-fortified soy milk. Fiber-fortified orange juice. Other Fiber bars. The items listed above may not be a complete list of recommended foods or beverages. Contact your dietitian for more options. WHAT FOODS ARE NOT RECOMMENDED? Grains White bread. Pasta made with refined flour. White rice. Vegetables Fried potatoes. Canned vegetables. Well-cooked vegetables.  Fruits Fruit juice. Cooked, strained fruit. Meats and Other Protein Sources Fatty cuts of meat. Fried Sales executive or fried fish. Dairy Milk. Yogurt. Cream cheese. Sour cream. Beverages Soft drinks. Other Cakes and pastries. Butter and oils. The items listed above may not be a complete list of foods and beverages to avoid. Contact your dietitian for more information. WHAT ARE SOME TIPS FOR INCLUDING HIGH-FIBER FOODS IN MY DIET?  Eat a wide variety of high-fiber foods.  Make sure that half of all grains consumed each day are whole grains.  Replace breads and cereals made from refined flour or white flour with whole-grain breads and cereals.  Replace white rice with brown rice, bulgur wheat, or millet.  Start the day with a breakfast that is high in fiber, such as a cereal that contains at least 5 grams of fiber per serving.  Use beans in place of meat in soups, salads, or pasta.  Eat high-fiber  snacks, such as berries, raw vegetables, nuts, or popcorn.   This information is not intended to replace advice given to you by your health care provider. Make sure you discuss any questions you have with your health care provider.   Document Released: 02/06/2005 Document Revised: 02/27/2014 Document Reviewed: 07/22/2013 Elsevier Interactive Patient Education [employer].

## 2015-03-11 NOTE — Plan of Care (Signed)
Problem: Food- and Nutrition-Related Knowledge Deficit (NB-1.1) Goal: Nutrition education Formal process to instruct or train a patient/client in a skill or to impart knowledge to help patients/clients voluntarily manage or modify food choices and eating behavior to maintain or improve health. Outcome: Completed/Met Date Met:  03/11/15 Nutrition Education Note  RD consulted for nutrition education regarding a low fiber diet.   RD provided "Low fiber Nutrition Therapy" handout from the Academy of Nutrition and Dietetics. Reviewed patient's dietary recall. Provided examples on ways to decrease fiber and fat intake in diet. Reviewed low and high fiber dietary sources. Reviewed strategy to slowly add fiber back into diet. Teach back method used.  Expect good compliance. Pt seems very determined to make diet changes.  Body mass index is 31.03 kg/(m^2). Pt meets criteria for obesity based on current BMI.  Current diet order is soft. Labs and medications reviewed. No further nutrition interventions warranted at this time. If additional nutrition issues arise, please re-consult RD.  Clayton Bibles, MS, RD, LDN Pager: [phone] After Hours Pager: [phone]

## 2015-03-16 ENCOUNTER — Telehealth: Payer: Self-pay

## 2015-03-16 NOTE — Telephone Encounter (Signed)
PCP: Willow Ora, MD  Admit date: 03/08/2015 Discharge date: 03/11/2015  Recommendations for Outpatient Follow-up:  1. Pt will need to follow up with PCP in 2-3 weeks post discharge 2. Complete therapy with Cipro and Flagyl  Discharge Diagnoses:  Active Problems:  Essential hypertension  Diverticulitis  Acute diverticulitis  Intra-abdominal abscess [facility])  Discharge Condition: Stable  Diet recommendation: as tolerated   Pt states she's doing better, just still trying to gain her strength.  She's eating without difficulty or abdominal pain.  She is taking medications as prescribed.  No concerns at this time.    Hospital follow up appt scheduled with Dr. Drue Novel on 03/24/15 @ 11:30 am per pt's request.

## 2015-03-19 ENCOUNTER — Telehealth: Payer: Self-pay | Admitting: Internal Medicine

## 2015-03-19 NOTE — Telephone Encounter (Signed)
[employer] paperwork received.01/27

## 2015-03-19 NOTE — Telephone Encounter (Signed)
Call from [employer] disability - stated claim info was faxed 03/17/15 to [phone] - I advised it is not documented as being received - she is refaxing today to [phone] °

## 2015-03-22 NOTE — Telephone Encounter (Signed)
LMOM with contact name and number for return call RE: [employer] paperwork cannot be completed until patient has completed her 03/24/15 F/U office visit; reiterated that her paperwork completion depends on her keeping her appointment per provider instructions/SLS 01/30

## 2015-03-24 ENCOUNTER — Encounter: Payer: Self-pay | Admitting: Internal Medicine

## 2015-03-24 ENCOUNTER — Ambulatory Visit (INDEPENDENT_AMBULATORY_CARE_PROVIDER_SITE_OTHER): Payer: 59 | Admitting: Internal Medicine

## 2015-03-24 VITALS — BP 122/78 | HR 75 | Temp 97.6°F | Ht 68.0 in | Wt 200.0 lb

## 2015-03-24 DIAGNOSIS — K5732 Diverticulitis of large intestine without perforation or abscess without bleeding: Secondary | ICD-10-CM | POA: Diagnosis not present

## 2015-03-24 DIAGNOSIS — R21 Rash and other nonspecific skin eruption: Secondary | ICD-10-CM | POA: Diagnosis not present

## 2015-03-24 DIAGNOSIS — Z09 Encounter for follow-up examination after completed treatment for conditions other than malignant neoplasm: Secondary | ICD-10-CM

## 2015-03-24 DIAGNOSIS — N390 Urinary tract infection, site not specified: Secondary | ICD-10-CM | POA: Diagnosis not present

## 2015-03-24 MED ORDER — BETAMETHASONE DIPROPIONATE AUG 0.05 % EX CREA: TOPICAL_CREAM | Freq: Two times a day (BID) | CUTANEOUS | Status: DC

## 2015-03-24 NOTE — Progress Notes (Signed)
Subjective:    Patient ID: Sara Norris, female    DOB: 04-Jul-1965, 50 y.o.   MRN: [id number]  DOS:  03/24/2015 Type of visit - description : Hospital follow-up Interval history: Admitting 03/08/2015 for 3 days with diverticulitis, perforated,  small abscess. Surgery recommended conservative treatment, gradually improved, sent home on Cipro and Flagyl. Had a UTI, UCX-- Escherichia coli. She already finished the antibiotics  Review of Systems Feeling better. No fever chills, appetite improved. No nausea, vomiting, diarrhea. Abdominal pain resolved. No dysuria or gross hematuria. Approximately 3 days ago developed a extremely itchy rash, see physical exam. Interestingly, this happened a couple of days after she moved to a new house, did not change her mattress.   Past Medical History  Diagnosis Date  . Hypertension     dx age 89  . Menopause     LMP age 14  . Contraception     menopausal  . Chronic knee pain     Past Surgical History  Procedure Laterality Date  . Cholecystectomy    . Tonsillectomy      Social History   Social History  . Marital Status: Single    Spouse Name: N/A  . Number of Children: 1  . Years of Education: N/A   Occupational History  . enviromental services  [employer]   Social History Main Topics  . Smoking status: Never Smoker   . Smokeless tobacco: Never Used  . Alcohol Use: Yes  . Drug Use: No     Comment: no marihuana ;lately   . Sexual Activity: Not on file   Other Topics Concern  . 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  Always use your most recent med list.               aspirin EC 81 MG tablet  Take 81 mg by mouth daily.     augmented betamethasone dipropionate 0.05 % cream  Commonly known as:  DIPROLENE-AF  Apply topically 2 (two) times daily.     carvedilol 6.25  MG tablet  Commonly known as:  COREG  Take 1 tablet by mouth twice daily.     diphenhydrAMINE 50 MG capsule  Commonly known as:  BENADRYL  Take 50 mg by mouth every 8 (eight) hours as needed for allergies. Reported on 03/08/2015     EPINEPHrine 0.3 mg/0.3 mL Soaj injection  Commonly known as:  EPI-PEN  Inject 0.3 mLs (0.3 mg total) into the muscle once.     losartan-hydrochlorothiazide 100-12.5 MG tablet  Commonly known as:  HYZAAR  TAKE 1 TABLET BY MOUTH DAILY.     magnesium citrate Soln  Take 1 Bottle by mouth once.     oxyCODONE 5 MG immediate release tablet  Commonly known as:  Oxy IR/ROXICODONE  Take 1 tablet (5 mg total) by mouth every 4 (four) hours as needed for moderate pain.     potassium chloride 10 MEQ tablet  Commonly known as:  K-DUR,KLOR-CON  Take 1 tablet daily.     zolpidem 10 MG tablet  Commonly known as:  AMBIEN  Take 1 tablet (10 mg total) by mouth at bedtime as needed for sleep.           Objective:  Physical Exam BP 122/78 mmHg  Pulse 75  Temp(Src) 97.6 F (36.4 C) (Oral)  Ht  (1.727 m)  Wt 200 lb (90.719 kg)  BMI 30.42 kg/m2  SpO2 96% General:   Well developed, well nourished . NAD.  HEENT:  Normocephalic . Face symmetric, atraumatic Lungs:  CTA B Normal respiratory effort, no intercostal retractions, no accessory muscle use. Heart: RRR,  no murmur.  no pretibial edema bilaterally  Abdomen:  Not distended, soft, non-tender. No rebound or rigidity.   Skin: Not pale. Not jaundice. Few papular, ~ 1/2 cm erythematous lesions without blisters or scaliness at the lower extremities, 2 or 3 in the lower back, one or 2 at  each arm, one at the hand. Neurologic:  alert & oriented X3.  Speech normal, gait appropriate for age and unassisted Psych--  Cognition and judgment appear intact.  Cooperative with normal attention span and concentration.  Behavior appropriate. No anxious or depressed appearing.    Assessment & Plan:    Assessment > Prediabetes: A1c 6.1 08-2014 HTN DX age 50 Insomnia - on ambien Early menopause - declines HRT H/o Knee pain Diverticulitis, perf, conservative Rx 02-2015  Plan: Diverticulitis: Resolved, refer to GI for consideration of a colonoscopy. High fiber diet encourage UTI: clinically resolved Rash: Does not look like a drug reaction, bedbugs? Allergic to carpet at her new apartment? . Recommend Benadryl, topical steroids. If not better she will let me know --->  pred 20 mg x 5 days ? Derm referral ?. Food allergies? Was referred to an allergist t before, has an appointment pending for March RTC 3 months, cpx

## 2015-03-24 NOTE — Patient Instructions (Addendum)
GO TO THE FRONT DESK Schedule a complete physical exam to be done in 3 months  Please be fasting     Apply the cream twice a day until better Continue Benadryl Call if not improving in the next few days

## 2015-03-24 NOTE — Progress Notes (Signed)
Pre visit review using our clinic review tool, if applicable. No additional management support is needed unless otherwise documented below in the visit note. 

## 2015-03-25 MED FILL — BETAMETHASONE DP AUG 0.05%: 0.05 | 20 days supply | Qty: 60 | Fill #0

## 2015-03-29 NOTE — Telephone Encounter (Signed)
Completed form faxed, copy to scan/SLS 02/01

## 2015-04-02 ENCOUNTER — Telehealth: Payer: Self-pay | Admitting: Internal Medicine

## 2015-04-02 NOTE — Telephone Encounter (Signed)
[facility] - Client TELEPHONE ADVICE RECORD   [facility] Medical Call Center    --------------------------------------------------------------------------------   Patient Name: Sara Norris  Gender: Female  DOB: 28-Jul-1965   Age: 50 Y 6 M 12 D  Return Phone Number: [phone] (Primary)  Address:     City/State/Zip:  [place]     [facility] - Client  Client Site [facility]  Physician Drue Novel, [place]   Contact Type Call  Who Is Calling Patient / Member / Family / Caregiver  Call Type Triage / Clinical  Relationship To Patient Self  Return Phone Number [phone] (Primary)  Chief Complaint Rash - Widespread  Reason for Call Symptomatic / Request for Health Information  Initial Comment Caller states that the patient is itching, has red dots all over, possibly from rx cream.  PreDisposition Home Care  Translation No       Nurse Assessment  Nurse: Logan Bores, RN, Melissa Date/Time (Eastern Time): 04/02/2015 3:46:56 PM  Confirm and document reason for call. If symptomatic, describe symptoms. You must click the next button to save text entered. ---Caller states that the patient is itching, has red dots all over    Has the patient traveled out of the country within the last 30 days? ---Not Applicable    Does the patient have any new or worsening symptoms? ---Yes    Will a triage be completed? ---Yes    Related visit to physician within the last 2 weeks? ---Yes    Does the PT have any chronic conditions? (i.e. diabetes, asthma, etc.) ---Yes    List chronic conditions. ---hypertension, diverticulitis- recently in hospital d/c 03/12/2015    Is the patient pregnant or possibly pregnant? (Ask all females between the ages of 12-55) ---No    Is this a behavioral health or substance abuse call? ---No           Guidelines          Guideline Title Affirmed Question Affirmed Notes Nurse Date/Time (Eastern Time)   Rash or Redness - Widespread SEVERE itching (i.e., interferes with sleep, normal activities or school)    Logan Bores, Charity fundraiser, [facility] 04/02/2015 3:50:50 PM    Disp. Time Lamount Cohen Time) Disposition Final User    04/02/2015 4:00:59 PM See Physician within 24 Hours   Logan Bores, RN, Efraim Kaufmann      04/02/2015 4:01:01 PM See Physician within 24 Hours Yes Logan Bores, RN, Efraim Kaufmann            Caller Understands: Yes  Disagree/Comply: Comply       Care Advice Given Per Guideline        SEE PHYSICIAN WITHIN 24 HOURS: BENADRYL FOR ITCHING: Take Benadryl (OTC diphenhydramine) 4 times per day until seen. Adult dose is 25-50 mg PO. CAUTION - ANTIHISTAMINES: * May cause sleepiness. Do not drink alcohol, drive or operate dangerous machinery while taking antihistamines. * Examples include diphenhydramine (Benadryl) and chlorpheniramine (Chlortrimeton, Chlor-tripolon) HYDROCORTISONE CREAM: * For very itchy spots, apply hydrocortisone cream 4 times a day for 5 days. OATMEAL AVEENO BATH FOR ITCHING - Sprinkle contents of one Aveeno packet under running faucet with comfortably warm water. Bathe for 15 - 20 minutes, 1-2 times daily. Pat dry using towel - do not rub. CALL BACK IF: * You become worse.        --------------------------------------------------------------------------------            Referrals  REFERRED TO PCP OFFICE  Comments  User: Ardeen Garland, RN Date/Time Lamount Cohen Time): 04/02/2015 4:15:07 PM  Caller advised to be seen in the [facility] as a walk-in. Given the contact information and location. Caller reports she works across the street from the clinic and would be able to go there tomorrow.   Referrals  REFERRED TO PCP OFFICE

## 2015-04-02 NOTE — Telephone Encounter (Signed)
Pt called stating the cream she was given is making it worse and feels like she has chicken pox. She is itching and has red dots popping up all over. Transferred to [facility] with [facility] for allergic reaction.

## 2015-04-06 ENCOUNTER — Telehealth: Payer: Self-pay | Admitting: Internal Medicine

## 2015-04-06 DIAGNOSIS — R21 Rash and other nonspecific skin eruption: Secondary | ICD-10-CM

## 2015-04-06 NOTE — Telephone Encounter (Signed)
[facility] - Client TELEPHONE ADVICE RECORD   [facility] Medical Call Center    --------------------------------------------------------------------------------   Patient Name: Sara Norris  Gender: Female  DOB: 08-Nov-1965   Age: 50 Y 6 M 16 D  Return Phone Number: [phone] (Primary)  Address:     City/State/Zip:  [place]     [facility] - Client  Client Site [facility]  Physician Drue Novel, [place]   Contact Type Call  Who Is Calling Patient / Member / Family / Caregiver  Call Type Triage / Clinical  Caller Name Makya Phillis  Relationship To Patient Self  Return Phone Number [phone] (Primary)  Chief Complaint Rash - Widespread  Reason for Call Symptomatic / Request for Health Information  Initial Comment Caller stated she is itching and breaking out and the medication she was prescribed is not working.   PreDisposition Call Doctor  Translation No       Nurse Assessment  Nurse: Logan Bores, RN, Melissa Date/Time (Eastern Time): 04/06/2015 3:35:11 PM  Confirm and document reason for call. If symptomatic, describe symptoms. You must click the next button to save text entered. ---Caller stated she is itching and breaking out and the medication she was prescribed is not working.    Has the patient traveled out of the country within the last 30 days? ---Not Applicable    Does the patient have any new or worsening symptoms? ---Yes    Will a triage be completed? ---Yes    Related visit to physician within the last 2 weeks? ---Yes    Does the PT have any chronic conditions? (i.e. diabetes, asthma, etc.) ---Yes    List chronic conditions. ---hypertension, diverticulosis    Is the patient pregnant or possibly pregnant? (Ask all females between the ages of 39-55) ---No    Is this a behavioral health or substance abuse call? ---No           Guidelines          Guideline Title Affirmed Question  Affirmed Notes Nurse Date/Time (Eastern Time)  Rash or Redness - Widespread SEVERE itching (i.e., interferes with sleep, normal activities or school)    Logan Bores, RN, [facility] 04/06/2015 3:39:22 PM    Disp. Time Lamount Cohen Time) Disposition Final User    04/06/2015 3:54:47 PM See Physician within 4 Hours (or PCP triage)   Logan Bores, RN, Melissa      04/06/2015 3:54:49 PM See Physician within 4 Hours (or PCP triage) Yes Logan Bores, RN, Melissa      Disposition Overriden: See Physician within 24 Hours  Override Reason: Specify reason. (Please document in 'advice recommended' section)  Caller Understands: Yes  Disagree/Comply: Comply       Care Advice Given Per Guideline        SEE PHYSICIAN WITHIN 24 HOURS: SEE PHYSICIAN WITHIN 4 HOURS (or PCP triage): BRING MEDICINES: CARE ADVICE given per Rash - Widespread and Cause Unknown (Adult) guideline.        --------------------------------------------------------------------------------            Referrals  [facility]

## 2015-04-06 NOTE — Telephone Encounter (Signed)
Called to follow up with patient.  She explained that she called because she continues to experience new outbreaks (rash) similar to the ones noted during 03/24/15 Office Visit.  Pt states she is taking benadryl and using cream, but does not feel the medications are helping.  She says the benadryl helps with the itching, but the rash continues to spread.  She denies chest pain, shortness of breath, facial or tongue swelling at this time.  She is currently at work and plans to keep the appt scheduled for tomorrow (03/07/15 @ 1:30 pm).  Pt was advised that if she starts to experience chest pain, shortness of breath, facial or tongue swelling to go to the ER.  She stated understanding and agreed to comply.  No further concerns voiced at this time.

## 2015-04-07 ENCOUNTER — Telehealth: Payer: Self-pay | Admitting: Internal Medicine

## 2015-04-07 ENCOUNTER — Ambulatory Visit: Payer: 59 | Admitting: Internal Medicine

## 2015-04-07 MED FILL — ZOLPIDEM TARTRATE 10 MG TAB: 10 | 30 days supply | Qty: 30 | Fill #0

## 2015-04-07 NOTE — Telephone Encounter (Signed)
Caller name: Sharne   Relationship to patient: Self  Can be reached: [phone]  Reason for call: Pt lvm at 11:27a. 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 Should pt be charged?

## 2015-04-07 NOTE — Telephone Encounter (Signed)
Pt unable to keep appt today due to work complications. Spoke w/ Dr. Drue Novel recommends referral to dermatology. LMOM informing Pt that Dr. Drue Novel recommends going straight to dermatology since we have previously seen her on 03/24/2015 for hospital f/u and she mentioned the itching and rash then. Informed her I have placed a dermatology referral and she should receive a call in several days to schedule an appt. Instructed her to call if she has any questions or concerns.

## 2015-04-07 NOTE — Telephone Encounter (Signed)
Noted  

## 2015-04-07 NOTE — Addendum Note (Signed)
Addended byConrad [place] D on: 04/07/2015 12:35 PM   Modules accepted: Orders

## 2015-04-07 NOTE — Telephone Encounter (Signed)
No Charge 

## 2015-04-14 ENCOUNTER — Encounter: Payer: Self-pay | Admitting: Internal Medicine

## 2015-04-14 ENCOUNTER — Ambulatory Visit (INDEPENDENT_AMBULATORY_CARE_PROVIDER_SITE_OTHER): Payer: 59 | Admitting: Internal Medicine

## 2015-04-14 VITALS — Ht 68.0 in

## 2015-04-14 DIAGNOSIS — R21 Rash and other nonspecific skin eruption: Secondary | ICD-10-CM

## 2015-04-14 NOTE — Progress Notes (Signed)
Pre visit review using our clinic review tool, if applicable. No additional management support is needed unless otherwise documented below in the visit note. 

## 2015-04-15 DIAGNOSIS — B86 Scabies: Secondary | ICD-10-CM | POA: Diagnosis not present

## 2015-04-15 MED FILL — PERMETHRIN 5% CREAM: 5 | 7 days supply | Qty: 60 | Fill #0

## 2015-05-03 ENCOUNTER — Telehealth: Payer: Self-pay | Admitting: *Deleted

## 2015-05-03 NOTE — Telephone Encounter (Signed)
Received FMLA/Disability paperwork from [employer], last completed on 03/29/15; pt has not been seen by PCP since 03/24/15; referred to Lynwood Dawleyiffany Gann, PA-C in Dermatology; [facility] name and number for return call RE: paperwork needing to be forwarded to Dermatology/SLS 03/13

## 2015-05-07 NOTE — Telephone Encounter (Signed)
Patient returning your call and has additional information said the forms should not go to dermatology because she was seen here for her diverticulitis after a hospital visit in february Patient needing this done today because her time will be denied if not completed sent back today. [phone]

## 2015-05-10 ENCOUNTER — Telehealth: Payer: Self-pay | Admitting: *Deleted

## 2015-05-10 NOTE — Telephone Encounter (Signed)
Paperwork has been completed and faxed for that medical issue on 03/29/15, see note as follows: Call Documentation      Regis BillSharon L Trayce Caravello, CMA at 03/29/2015 1:10 PM     Status: Signed       Expand All Collapse All   Completed form faxed, copy to scan/SLS 02/01        Regis BillSharon L Foy Mungia, CMA at 03/22/2015 10:15 AM     Status: Signed       Expand All Collapse All   LMOM with contact name and number for return call RE: [employer] paperwork cannot be completed until patient has completed her 03/24/15 F/U office visit; reiterated that her paperwork completion depends on her keeping her appointment per provider instructions/SLS 01/30        Regis BillSharon L Jashan Cotten, CMA at 03/19/2015 5:17 PM     Status: Signed       Expand All Collapse All   [employer] paperwork received.01/27        Maia PettiesKristie S Ortiz at 03/19/2015 4:41 PM     Status: Signed       Expand All Collapse All   [place] [place] - stated claim info was faxed 03/17/15 to [phone] - I advised it is not documented as being received - she is refaxing today to [phone]

## 2015-05-10 NOTE — Telephone Encounter (Signed)
Received paperwork from [employer] for FMLA to cover from patient's Admittance [05/06/15] until she seen PCP for F/U [03/24/15, completed as much as possible; forwarded to provider/SLS 03/20

## 2015-05-13 ENCOUNTER — Institutional Professional Consult (permissible substitution): Payer: 59 | Admitting: Internal Medicine

## 2015-05-18 ENCOUNTER — Other Ambulatory Visit: Payer: Self-pay | Admitting: Internal Medicine

## 2015-05-18 MED FILL — CARVEDILOL 6.25 MG TABLET: 6.25 | 30 days supply | Qty: 60 | Fill #0

## 2015-06-08 ENCOUNTER — Telehealth: Payer: Self-pay

## 2015-06-08 MED ORDER — ZOLPIDEM TARTRATE 10 MG PO TABS: 10.0000 mg | ORAL_TABLET | Freq: Every evening | ORAL | Status: DC | PRN

## 2015-06-08 NOTE — Telephone Encounter (Signed)
Okay #30 and 4 refills 

## 2015-06-08 NOTE — Telephone Encounter (Signed)
Rx faxed to [facility].  

## 2015-06-08 NOTE — Telephone Encounter (Signed)
Rx printed, awaiting MD signature.  

## 2015-06-08 NOTE — Telephone Encounter (Signed)
Pt is requesting refill on Ambien.  Last OV: 04/14/2015 Last Fill: 03/11/2015 #30 and 0RF UDS: Not needed  Please advise.

## 2015-06-25 ENCOUNTER — Encounter: Payer: Self-pay | Admitting: Internal Medicine

## 2015-06-25 ENCOUNTER — Ambulatory Visit (INDEPENDENT_AMBULATORY_CARE_PROVIDER_SITE_OTHER): Payer: 59 | Admitting: Internal Medicine

## 2015-06-25 VITALS — BP 170/90 | HR 88 | Temp 98.6°F | Ht 68.0 in | Wt 198.0 lb

## 2015-06-25 DIAGNOSIS — Z09 Encounter for follow-up examination after completed treatment for conditions other than malignant neoplasm: Secondary | ICD-10-CM

## 2015-06-25 DIAGNOSIS — R1032 Left lower quadrant pain: Secondary | ICD-10-CM | POA: Diagnosis not present

## 2015-06-25 DIAGNOSIS — K5792 Diverticulitis of intestine, part unspecified, without perforation or abscess without bleeding: Secondary | ICD-10-CM

## 2015-06-25 LAB — POC URINALSYSI DIPSTICK (AUTOMATED)
GLUCOSE UA: NEGATIVE
KETONES UA: NEGATIVE
Nitrite, UA: NEGATIVE
Urobilinogen, UA: 1
pH, UA: 6

## 2015-06-25 LAB — URINALYSIS, ROUTINE W REFLEX MICROSCOPIC
KETONES UR: NEGATIVE
NITRITE: NEGATIVE
Specific Gravity, Urine: 1.025 (ref 1.000–1.030)
URINE GLUCOSE: NEGATIVE
Urobilinogen, UA: 0.2 (ref 0.0–1.0)
pH: 6 (ref 5.0–8.0)

## 2015-06-25 MED ORDER — AMOXICILLIN-POT CLAVULANATE 875-125 MG PO TABS
1.0000 | ORAL_TABLET | Freq: Two times a day (BID) | ORAL | Status: DC
Start: 2015-06-25 — End: 2015-11-19

## 2015-06-25 MED FILL — ZOLPIDEM TARTRATE 10 MG TAB: 10 | 30 days supply | Qty: 30 | Fill #0

## 2015-06-25 MED FILL — AMOX-CLAV 875-125 MG TABLET: 875-125 | 10 days supply | Qty: 20 | Fill #0

## 2015-06-25 NOTE — Progress Notes (Signed)
Pre visit review using our clinic review tool, if applicable. No additional management support is needed unless otherwise documented below in the visit note. 

## 2015-06-25 NOTE — Patient Instructions (Addendum)
Drink plenty of fluids, take Augmentin twice a day for 10 days Okay to start eating a bland diet  If you have fever, chills, severe symptoms, persisting nausea, or if you are not gradually improving over the next 5 or 6 days: Call or go to the ER  Please schedule a physical exam at your earliest convenience

## 2015-06-25 NOTE — Assessment & Plan Note (Signed)
Left abdominal pain, diverticulitis vs UTI (Had actually both during the last admission few months ago) UDIP + WBC /RBC She does not look toxic, will treat empirically with Augmentin. Strongly advise ER visit if severe fever, chills, sx worsen or she is not back to normal within few days. Verbalized understanding. Sending a UA and urine culture Will enter a GI referral, apparently not done the last time HTN: BP elevated today,  BP is usually normal, did not take her medication this morning. No change. 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

## 2015-06-25 NOTE — Progress Notes (Signed)
Subjective:    Patient ID: Sara Norris, female    DOB: August 26, 1965, 50 y.o.   MRN: [id number]  DOS:  06/25/2015 Type of visit - description : Acute visit, diverticulitis flare up? Interval history: Symptoms started yesterday with mild fever, chills and some nausea. Then developed left sided abdominal pain, no radiation but occasionally has some discomfort at the lower abdomen bilaterally. Had 3 to 4 ibuprofen w/ some relief This morning the pain is still there but not as severe. Drinking plenty of fluids but elected not to eat, she is afraid may increase her symptoms  BP Readings from Last 3 Encounters:  06/25/15 170/90  03/24/15 122/78  03/11/15 172/90     Review of Systems  No difficulty breathing or lower extremity edema No vomiting, blood in the stools, had diarrhea one time yesterday, had a normal appearing bowel movement this morning No dysuria, gross hematuria or difficulty urinating  Past Medical History  Diagnosis Date  . Hypertension     dx age 89  . Menopause     LMP age 50  . Contraception     menopausal  . Chronic knee pain     Past Surgical History  Procedure Laterality Date  . Cholecystectomy    . Tonsillectomy      Social History   Social History  . Marital Status: Single    Spouse Name: N/A  . Number of Children: 1  . Years of Education: N/A   Occupational History  . enviromental services  [employer]   Social History Main Topics  . Smoking status: Never Smoker   . Smokeless tobacco: Never Used  . Alcohol Use: Yes  . Drug Use: No     Comment: no marihuana ;lately   . Sexual Activity: Not on file   Other Topics Concern  . 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  Always use your most recent med list.               amoxicillin-clavulanate 875-125 MG tablet  Commonly known as:   AUGMENTIN  Take 1 tablet by mouth 2 (two) times daily.     aspirin EC 81 MG tablet  Take 81 mg by mouth daily.     augmented betamethasone dipropionate 0.05 % cream  Commonly known as:  DIPROLENE-AF  Apply topically 2 (two) times daily.     carvedilol 6.25 MG tablet  Commonly known as:  COREG  Take 1 tablet (6.25 mg total) by mouth 2 (two) times daily with a meal.     diphenhydrAMINE 50 MG capsule  Commonly known as:  BENADRYL  Take 50 mg by mouth every 8 (eight) hours as needed for allergies. Reported on 03/08/2015     EPINEPHrine 0.3 mg/0.3 mL Soaj injection  Commonly known as:  EPI-PEN  Inject 0.3 mLs (0.3 mg total) into the muscle once.     losartan-hydrochlorothiazide 100-12.5 MG tablet  Commonly known as:  HYZAAR  TAKE 1 TABLET BY MOUTH DAILY.     magnesium citrate Soln  Take 1 Bottle by mouth once.     oxyCODONE 5 MG immediate release tablet  Commonly known as:  Oxy IR/ROXICODONE  Take 1 tablet (5 mg total) by mouth every 4 (four) hours  as needed for moderate pain.     potassium chloride 10 MEQ tablet  Commonly known as:  K-DUR,KLOR-CON  Take 1 tablet daily.     zolpidem 10 MG tablet  Commonly known as:  AMBIEN  Take 1 tablet (10 mg total) by mouth at bedtime as needed for sleep.           Objective:   Physical Exam  Abdominal:     BP 170/90 mmHg  Pulse 88  Temp(Src) 98.6 F (37 C) (Oral)  Ht  (1.727 m)  Wt 198 lb (89.812 kg)  BMI 30.11 kg/m2  SpO2 99% General:   Well developed, well nourished . NAD.  HEENT:  Normocephalic . Face symmetric, atraumatic Lungs:  CTA B Normal respiratory effort, no intercostal retractions, no accessory muscle use. Heart: RRR,  no murmur.  no pretibial edema bilaterally  Abdomen:  Not distended, soft, + tenderness without mass, rebound or rigidity.  Skin: Not pale. Not jaundice Neurologic:  alert & oriented X3.  Speech normal, gait appropriate for age and unassisted Psych--  Cognition and judgment  appear intact.  Cooperative with normal attention span and concentration.  Behavior appropriate. No anxious or depressed appearing.    Assessment & Plan:   Assessment > Prediabetes: A1c 6.1 08-2014 HTN DX age 25 Insomnia - on [place] Early menopause - declines HRT H/o Knee pain Diverticulitis, 1st episode 02-2015, + perf, conservative Rx   PLAN: Left abdominal pain, diverticulitis vs UTI (Had actually both during the last admission few months ago) UDIP + WBC /RBC She does not look toxic, will treat empirically with Augmentin. Strongly advise ER visit if severe fever, chills, sx worsen or she is not back to normal within few days. Verbalized understanding. Sending a UA and urine culture Will enter a GI referral, apparently not done the last time HTN: BP elevated today,  BP is usually normal, did not take her medication this morning. No change. 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

## 2015-06-26 LAB — URINE CULTURE

## 2015-08-03 MED FILL — CARVEDILOL 6.25 MG TABLET: 6.25 | 30 days supply | Qty: 60 | Fill #1

## 2015-08-03 MED FILL — ZOLPIDEM TARTRATE 10 MG TAB: 10 | 30 days supply | Qty: 30 | Fill #1

## 2015-08-12 ENCOUNTER — Encounter: Payer: Self-pay | Admitting: *Deleted

## 2015-08-18 ENCOUNTER — Encounter: Payer: 59 | Admitting: Internal Medicine

## 2015-08-25 ENCOUNTER — Telehealth: Payer: Self-pay | Admitting: *Deleted

## 2015-08-25 ENCOUNTER — Ambulatory Visit: Payer: 59 | Admitting: Internal Medicine

## 2015-08-25 NOTE — Telephone Encounter (Signed)
No show letter mailed to patient. 

## 2015-09-22 ENCOUNTER — Encounter: Payer: 59 | Admitting: Internal Medicine

## 2015-09-27 MED FILL — ZOLPIDEM TARTRATE 10 MG TAB: 10 | 30 days supply | Qty: 30 | Fill #2

## 2015-09-29 MED FILL — CARVEDILOL 6.25 MG TABLET: 6.25 | 30 days supply | Qty: 60 | Fill #2

## 2015-11-05 MED FILL — ZOLPIDEM TARTRATE 10 MG TAB: 10 | 30 days supply | Qty: 30 | Fill #3

## 2015-11-17 ENCOUNTER — Telehealth: Payer: Self-pay | Admitting: Internal Medicine

## 2015-11-17 NOTE — Telephone Encounter (Signed)
Spoke w/ Pt, informed her of recommendations. She is currently working today and tomorrow but has scheduled acute visit w/ Edward on 9/29 at 0800. Informed her if she gets worse to go to urgent care/ED. Pt verbalized understanding.

## 2015-11-17 NOTE — Telephone Encounter (Signed)
Please advice patient, is not appropriate to treat possible diverticulitis over the phone, she definitely needs to be seen. Please schedule an appointment with me or another provider at the office today. If severe symptoms: [facility]

## 2015-11-17 NOTE — Telephone Encounter (Signed)
Please advise 

## 2015-11-17 NOTE — Telephone Encounter (Signed)
°  [facility] [place] [address]     Pt says that she would like to know if PCP could call her in a antibiotic and pain medication. She says that her diaticulitis is flaring up. I informed pt that an appt is probably needed. She says that she has been seen for concern before by PCP. She says that he provided her with medication.     CB: [phone]

## 2015-11-19 ENCOUNTER — Telehealth: Payer: Self-pay | Admitting: Medical

## 2015-11-19 ENCOUNTER — Encounter: Payer: Self-pay | Admitting: Medical

## 2015-11-19 ENCOUNTER — Ambulatory Visit (INDEPENDENT_AMBULATORY_CARE_PROVIDER_SITE_OTHER): Payer: 59 | Admitting: Medical

## 2015-11-19 VITALS — BP 180/100 | HR 83 | Temp 98.9°F | Ht 68.0 in | Wt 199.2 lb

## 2015-11-19 DIAGNOSIS — B029 Zoster without complications: Secondary | ICD-10-CM

## 2015-11-19 DIAGNOSIS — Z8719 Personal history of other diseases of the digestive system: Secondary | ICD-10-CM

## 2015-11-19 DIAGNOSIS — I10 Essential (primary) hypertension: Secondary | ICD-10-CM

## 2015-11-19 DIAGNOSIS — R109 Unspecified abdominal pain: Secondary | ICD-10-CM

## 2015-11-19 LAB — CBC WITH DIFFERENTIAL/PLATELET
BASOS ABS: 0.1 10*3/uL (ref 0.0–0.1)
BASOS PCT: 1.3 % (ref 0.0–3.0)
EOS ABS: 0.3 10*3/uL (ref 0.0–0.7)
Eosinophils Relative: 5.8 % — ABNORMAL HIGH (ref 0.0–5.0)
HEMATOCRIT: 38.6 % (ref 36.0–46.0)
HEMOGLOBIN: 13.2 g/dL (ref 12.0–15.0)
LYMPHS PCT: 31.6 % (ref 12.0–46.0)
Lymphs Abs: 1.7 10*3/uL (ref 0.7–4.0)
MCHC: 34.3 g/dL (ref 30.0–36.0)
MCV: 89 fl (ref 78.0–100.0)
MONO ABS: 0.5 10*3/uL (ref 0.1–1.0)
Monocytes Relative: 9.3 % (ref 3.0–12.0)
Neutro Abs: 2.8 10*3/uL (ref 1.4–7.7)
Neutrophils Relative %: 52 % (ref 43.0–77.0)
PLATELETS: 330 10*3/uL (ref 150.0–400.0)
RBC: 4.33 Mil/uL (ref 3.87–5.11)
RDW: 13 % (ref 11.5–15.5)
WBC: 5.3 10*3/uL (ref 4.0–10.5)

## 2015-11-19 LAB — COMPREHENSIVE METABOLIC PANEL
ALBUMIN: 4 g/dL (ref 3.5–5.2)
ALT: 15 U/L (ref 0–35)
AST: 20 U/L (ref 0–37)
Alkaline Phosphatase: 82 U/L (ref 39–117)
BILIRUBIN TOTAL: 0.5 mg/dL (ref 0.2–1.2)
BUN: 19 mg/dL (ref 6–23)
CALCIUM: 9.5 mg/dL (ref 8.4–10.5)
CHLORIDE: 101 meq/L (ref 96–112)
CO2: 31 meq/L (ref 19–32)
CREATININE: 0.88 mg/dL (ref 0.40–1.20)
GFR: 87.41 mL/min (ref >60.00)
Glucose, Bld: 94 mg/dL (ref 70–99)
Potassium: 3.1 mEq/L — ABNORMAL LOW (ref 3.5–5.1)
SODIUM: 140 meq/L (ref 135–145)
Total Protein: 7.9 g/dL (ref 6.0–8.3)

## 2015-11-19 MED ORDER — POTASSIUM CHLORIDE ER 10 MEQ PO TBCR: 10.0000 meq | EXTENDED_RELEASE_TABLET | Freq: Every day | ORAL | 0 refills | Status: DC

## 2015-11-19 MED ORDER — FAMCICLOVIR 500 MG PO TABS: 500.0000 mg | ORAL_TABLET | Freq: Three times a day (TID) | ORAL | 0 refills | Status: DC

## 2015-11-19 MED ORDER — TRAMADOL HCL 50 MG PO TABS: 50.0000 mg | ORAL_TABLET | Freq: Three times a day (TID) | ORAL | 0 refills | Status: DC | PRN

## 2015-11-19 MED ORDER — CIPROFLOXACIN HCL 500 MG PO TABS: 500.0000 mg | ORAL_TABLET | Freq: Two times a day (BID) | ORAL | 0 refills | Status: DC

## 2015-11-19 MED ORDER — METRONIDAZOLE 500 MG PO TABS: 500.0000 mg | ORAL_TABLET | Freq: Three times a day (TID) | ORAL | 0 refills | Status: DC

## 2015-11-19 MED ORDER — EPINEPHRINE 0.3 MG/0.3ML IJ SOAJ: 0.3000 mg | Freq: Once | INTRAMUSCULAR | 0 refills | Status: AC

## 2015-11-19 MED FILL — traMADol HCL 50 MG TABS: 50 | 4 days supply | Qty: 12 | Fill #0

## 2015-11-19 MED FILL — FAMCICLOVIR 500 MG TABLET: 500 | 7 days supply | Qty: 21 | Fill #0

## 2015-11-19 MED FILL — EPINEPHRINE 0.3 MG AUTO-INJ: 0.3 | 2 days supply | Qty: 2 | Fill #0

## 2015-11-19 NOTE — Progress Notes (Signed)
Subjective:    Patient ID: Sara Norris, female    DOB: June 06, 1965, 50 y.o.   MRN: [id number]  HPI  Pt in with some pain recently on Tuesday or Wednesday. Pain was mild and in flank areas. Pt thought maybe diverticulitis in past. Pt did have some faint llq pain at that time. Pt did not feel constipated but mom told her to take miralax. Pt last bm was last night. Mild soft. No fever, or sweats. Then states maybe chills but then states has hot flashes in the past.  Pt has never had colonosocpy. That was scheduled in January when she was hospitalized for diverticulitis. Had abscess at that time. Pt never followed up with GI. Pt never made that appointment.  Pt also had red rash to her left leg area. Sharp burn sensation Sensitive to touch. Rash started 2 nights ago.   Pt also mentioned hx of severe rash. That occurred July 2016. She was given an epi-pen. Pt never had to use but she needs one.   Pt has htn history. No bp meds. No cardiac or neurologic signs or symptoms. Pt has not taken her coreg this am.   Review of Systems See hpi.     Past Medical History:  Diagnosis Date  . Chronic knee pain   . Colonic diverticular abscess   . Contraception    menopausal  . Diverticulosis   . Hiatal hernia   . Hypertension    dx age 50  . Menopause    LMP age 50  . Umbilical hernia      Social History   Social History  . Marital status: Single    Spouse name: N/A  . Number of children: 1  . Years of education: N/A   Occupational History  . enviromental services  [employer]   Social History Main Topics  . Smoking status: Never Smoker  . Smokeless tobacco: Never Used  . Alcohol use Yes  . Drug use: No     Comment: no marihuana ;lately   . Sexual activity: Not on file   Other Topics Concern  . 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  CHOLECYSTECTOMY    . TONSILLECTOMY      Family History  Problem Relation Age of Onset  . Coronary artery disease Neg Hx   . Stroke Neg Hx   . Colon cancer Neg Hx   . Breast cancer Neg Hx   . Hypertension Mother   . Hypertension Father   . Hypertension      GP  . Diabetes Mother   . Diabetes Father     GP    Allergies  Allergen Reactions  . Citrus Rash  . Codeine Rash  . Pollen Extract     sneezing    Current Outpatient Prescriptions on File Prior to Visit  Medication Sig Dispense Refill  . aspirin EC 81 MG tablet Take 81 mg by mouth daily.    Marland Kitchen. losartan-hydrochlorothiazide (HYZAAR) 100-12.5 MG per tablet TAKE 1 TABLET BY MOUTH DAILY. 30 tablet 10  . zolpidem (AMBIEN) 10 MG tablet Take 1 tablet (10 mg total) by mouth at bedtime as needed for sleep. 30 tablet 4  . augmented betamethasone dipropionate (DIPROLENE-AF) 0.05 % cream Apply topically 2 (two) times daily. 60 g 0  . carvedilol (COREG)  6.25 MG tablet Take 1 tablet (6.25 mg total) by mouth 2 (two) times daily with a meal. 60 tablet 6  . diphenhydrAMINE (BENADRYL) 50 MG capsule Take 50 mg by mouth every 8 (eight) hours as needed for allergies. Reported on 03/08/2015    . potassium chloride (K-DUR,KLOR-CON) 10 MEQ tablet Take 1 tablet daily. (Patient not taking: Reported on 11/19/2015) 30 tablet 10  . [DISCONTINUED] potassium chloride (KLOR-CON 10) 10 MEQ tablet Take 1 tablet (10 mEq total) by mouth daily. 30 tablet 3   No current facility-administered medications on file prior to visit.     BP (!) 180/115   Pulse 83   Temp 98.9 F (37.2 C) (Oral)   Ht 5\' 8"  (1.727 m)   Wt 199 lb 3.2 oz (90.4 kg)   SpO2 99%   BMI 30.29 kg/m         Objective:   Physical Exam   General Mental Status- Alert. General Appearance- Not in acute distress.   Skin General:Scattered grouped papular and vesiclur rash mid lumbar area and down anterior aspect of her rt thigh.   Neck Carotid Arteries- Normal color. Moisture-  Normal Moisture. No carotid bruits. No JVD.  Chest and Lung Exam Auscultation: Breath Sounds:-Normal.  Cardiovascular Auscultation:Rythm- Regular. Murmurs & Other Heart Sounds:Auscultation of the heart reveals- No Murmurs.  Abdomen Inspection:-Inspeection Normal. Palpation/Percussion:Note:No mass. Palpation and Percussion of the abdomen reveal- Non Tender, Non Distended + BS, no rebound or guarding.  Neurologic Cranial Nerve exam:- CN III-XII intact(No nystagmus), symmetric smile. Drift Test:- No drift. Heal to Toe Gait exam:-Normal. Finger to Nose:- Normal/Intact Strength:- 5/5 equal and symmetric strength both upper and lower extremities.     Assessment & Plan:  You don't have any abdomen pain now but based on history of diverticultis and abscess in January I do think best to get cbc and cmp today. If any recurrent pain as discussed start cipro and flagyl. Update [place] on Monday if you had to start medicine. Will refer you to GI for evaluation and colonoscopy.  For shingles rx famvir. If you have increasing nerve type pain then can use tramadol.  For htn please take you took you coreg in our office. bp should come down. Please get bp cuff otc and check bp over weekend daily. If bp not coming down, cardiac or neurologic signs or symptoms then ED evaluation.  You mentioned history of remote severer allergic reaction. I will write epipen. But strict advisement on when to use. Only in event of anaphylactic type reaction. For example now with recent high bp would not recommend. Also bp cuff would come in handy with your history of high bp.  Follow up Tuesday next week or as needed

## 2015-11-19 NOTE — Telephone Encounter (Signed)
Sent rx kdur to her pharmacy

## 2015-11-19 NOTE — Patient Instructions (Signed)
You don't have any abdomen pain now but based on history of diverticultis and abscess in January I do think best to get cbc and cmp today. If any recurrent pain as discussed start cipro and flagyl. Update us on Monday if you had to start medicine. Will refer you to GI for evaluation and colonoscopy.  For shingles rx famvir. If you have increasing nerve type pain then can use tramadol.  For htn please take you took you coreg in our office. bp should come down. Please get bp cuff otc and check bp over weekend daily. If bp not coming down, cardiac or neurologic signs or symptoms then ED evaluation.  You mentioned history of remote severer allergic reaction. I will write epipen. But strict advisement on when to use. Only in event of anaphylactic type reaction. For example now with recent high bp would not recommend. Also bp cuff would come in handy with your history of high bp.  Follow up Tuesday next week or as needed

## 2015-11-19 NOTE — Progress Notes (Signed)
Pre visit review using our clinic tool,if applicable. No additional management support is needed unless otherwise documented below in the visit note.  

## 2015-11-22 MED FILL — POTASSIUM CL 10 MEQ TAB SA: 10 | 10 days supply | Qty: 10 | Fill #0

## 2015-11-30 ENCOUNTER — Encounter: Payer: 59 | Admitting: Internal Medicine

## 2015-12-09 ENCOUNTER — Telehealth: Payer: Self-pay

## 2015-12-09 NOTE — Telephone Encounter (Signed)
FMLA paperwork received for Pt. Pt seen by Ramon DredgeEdward on 11/19/2015 for Diverticulitis. Forms placed in red folder.

## 2015-12-15 ENCOUNTER — Telehealth: Payer: Self-pay | Admitting: Medical

## 2015-12-15 NOTE — Telephone Encounter (Signed)
I just got some [employer] paper work to fill out. Pt never followed up for her abdomen pain when I last saw her . Also her bp ws very high on day of last visit. Can you call her and ask her to come in for evaluation. Can she come in on Friday. If so schedule 30 minutes. Give me time to fill form and address bp if elevated.

## 2015-12-15 NOTE — Telephone Encounter (Signed)
Patient has an appointment for 12/16/15 due to her work schedule she was not able to come in before 5 pm. Ramon Dredgedward is aware.

## 2015-12-16 ENCOUNTER — Ambulatory Visit (INDEPENDENT_AMBULATORY_CARE_PROVIDER_SITE_OTHER): Payer: 59 | Admitting: Medical

## 2015-12-16 ENCOUNTER — Encounter: Payer: Self-pay | Admitting: Medical

## 2015-12-16 VITALS — BP 184/118 | HR 78 | Temp 98.1°F | Wt 195.4 lb

## 2015-12-16 DIAGNOSIS — M25561 Pain in right knee: Secondary | ICD-10-CM | POA: Diagnosis not present

## 2015-12-16 DIAGNOSIS — Z8719 Personal history of other diseases of the digestive system: Secondary | ICD-10-CM | POA: Diagnosis not present

## 2015-12-16 DIAGNOSIS — I10 Essential (primary) hypertension: Secondary | ICD-10-CM

## 2015-12-16 NOTE — Progress Notes (Signed)
Subjective:    Patient ID: Sara BondsLorrie A Wish, female    DOB: [birth year], 50 y.o.   MRN: [id number]  HPI   Pt in for bp check. She just took her bp medication. She just got off work and was in a big hurry.   No cardiac or neurologic signs or symptoms. Pt is on both coreg and hyzarr but her bp is still elevated. Her bp when I checked 168/100. Last time bp was high as well.  Pt attributes her bp to stress. Pt has been on amlodipine and lisinopril in the past. She did not like the way she felt.   Pt was on clonidine but pcp took her off.   On further review she admits to not taking coreg every day. She states on average takes only every other day. But did take it today.  Pain rt knee after turning quickly in line about 2 weeks ago(describes foot planted and her she twisted her thorax).Pain not subsiding. Daily moderste pain.         Review of Systems  Constitutional: Negative for chills and fatigue.  Eyes: Negative for photophobia and visual disturbance.  Respiratory: Negative for cough, chest tightness, shortness of breath and wheezing.   Cardiovascular: Negative for chest pain and palpitations.  Gastrointestinal: Negative for abdominal distention, abdominal pain, blood in stool, constipation, diarrhea and vomiting.  Genitourinary: Negative for dysuria.  Musculoskeletal:       Left knee pain.  Neurological: Negative for dizziness, tremors, seizures, syncope, speech difficulty, weakness, light-headedness and headaches.  Hematological: Negative for adenopathy. Does not bruise/bleed easily.  Psychiatric/Behavioral: Negative for behavioral problems, confusion, dysphoric mood and hallucinations.    Past Medical History:  Diagnosis Date  . Chronic knee pain   . Colonic diverticular abscess   . Contraception    menopausal  . Diverticulosis   . Hiatal hernia   . Hypertension    dx age 50  . Menopause    LMP age 50  . Umbilical hernia      Social History   Social History  .  Marital status: Single    Spouse name: N/A  . Number of children: 1  . Years of education: N/A   Occupational History  . enviromental services  [employer]   Social History Main Topics  . Smoking status: Never Smoker  . Smokeless tobacco: Never Used  . Alcohol use Yes  . Drug use: No     Comment: no marihuana ;lately   . Sexual activity: Not on file   Other Topics Concern  . 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 CHOLECYSTECTOMY    . TONSILLECTOMY      Family History  Problem Relation Age of Onset  . Coronary artery disease Neg Hx   . Stroke Neg Hx   . Colon cancer Neg Hx   . Breast cancer Neg Hx   . Hypertension Mother   . Hypertension Father   . Hypertension      GP  . Diabetes Mother   . Diabetes Father     GP    Allergies  Allergen Reactions  . Citrus Rash  . Codeine Rash  . Pollen Extract     sneezing    Current Outpatient Prescriptions on File Prior to Visit  Medication Sig Dispense Refill  . aspirin  EC 81 MG tablet Take 81 mg by mouth daily.    Marland Kitchen augmented betamethasone dipropionate (DIPROLENE-AF) 0.05 % cream Apply topically 2 (two) times daily. 60 g 0  . carvedilol (COREG) 6.25 MG tablet Take 1 tablet (6.25 mg total) by mouth 2 (two) times daily with a meal. 60 tablet 6  . ciprofloxacin (CIPRO) 500 MG tablet Take 1 tablet (500 mg total) by mouth 2 (two) times daily. 14 tablet 0  . diphenhydrAMINE (BENADRYL) 50 MG capsule Take 50 mg by mouth every 8 (eight) hours as needed for allergies. Reported on 03/08/2015    . famciclovir (FAMVIR) 500 MG tablet Take 1 tablet (500 mg total) by mouth 3 (three) times daily. 21 tablet 0  . losartan-hydrochlorothiazide (HYZAAR) 100-12.5 MG per tablet TAKE 1 TABLET BY MOUTH DAILY. 30 tablet 10  . metroNIDAZOLE (FLAGYL) 500 MG tablet Take 1 tablet (500 mg total) by mouth 3 (three) times  daily. 21 tablet 0  . potassium chloride (K-DUR) 10 MEQ tablet Take 1 tablet (10 mEq total) by mouth daily. 10 tablet 0  . potassium chloride (K-DUR,KLOR-CON) 10 MEQ tablet Take 1 tablet daily. (Patient not taking: Reported on 11/19/2015) 30 tablet 10  . traMADol (ULTRAM) 50 MG tablet Take 1 tablet (50 mg total) by mouth every 8 (eight) hours as needed. 12 tablet 0  . zolpidem (AMBIEN) 10 MG tablet Take 1 tablet (10 mg total) by mouth at bedtime as needed for sleep. 30 tablet 4   No current facility-administered medications on file prior to visit.     BP (!) 184/118 (BP Location: Left Arm, Patient Position: Sitting, Cuff Size: Normal)   Pulse 78   Temp 98.1 F (36.7 C) (Oral)   Wt 195 lb 6.4 oz (88.6 kg)   SpO2 100%   BMI 29.71 kg/m       Objective:   Physical Exam  General Mental Status- Alert. General Appearance- Not in acute distress.   Skin General: Color- Normal Color. Moisture- Normal Moisture.  Neck Carotid Arteries- Normal color. Moisture- Normal Moisture. No carotid bruits. No JVD.  Chest and Lung Exam Auscultation: Breath Sounds:-Normal.  Cardiovascular Auscultation:Rythm- Regular. Murmurs & Other Heart Sounds:Auscultation of the heart reveals- No Murmurs.  Abdomen Inspection:-Inspeection Normal. Palpation/Percussion:Note:No mass. Palpation and Percussion of the abdomen reveal- Non Tender, Non Distended + BS, no rebound or guarding.    Neurologic Cranial Nerve exam:- CN III-XII intact(No nystagmus), symmetric smile. Drift Test:- No drift. Finger to Nose:- Normal/Intact Strength:- 5/5 equal and symmetric strength both upper and lower extremities.  Left knee- upper lateral aspect pain on palpation. Faint crepitus.    Assessment & Plan:  For your htn you need to check you bp daily at home and at work. Please be compliant on your bp meds particularly coreg. If your bp is not 140/90 or less then take coreg 12.5 mg twice daily.(double up on current dose).  Check both your bp and pulse with higher dose. Want to know if pulse less than 60. If you have any stroke signs or symptoms as discussed then ED evaluation.  Will fill out your fmla form in event you have diverticulitis flares.  For your left knee pain for 2 weeks will put in order for xray. Then will refer you to ortho since the pain is not subsiding.   Follow up in 10-14 days or as needed  [place] Retana, Ramon Dredge, [employer]

## 2015-12-16 NOTE — Patient Instructions (Addendum)
For your htn you need to check you bp daily at home and at work. Please be compliant on your bp meds particularly coreg. If your bp is not 140/90 or less then take coreg 12.5 mg twice daily.(double up on current dose). Check both your bp and pulse with higher dose. Want to know if pulse less than 60. If you have any stroke signs or symptoms as discussed then ED evaluation.  Will fill out your fmla form in event you have diverticulitis flares.  For your left knee pain for 2 weeks will put in order for xray. Then will refer you to ortho since the pain is not subsiding.   Follow up in 10-14 days or as needed

## 2015-12-16 NOTE — Progress Notes (Signed)
Pre visit review using our clinic review tool, if applicable. No additional management support is needed unless otherwise documented below in the visit note. 

## 2015-12-20 ENCOUNTER — Telehealth: Payer: Self-pay | Admitting: Medical

## 2015-12-20 DIAGNOSIS — Z8719 Personal history of other diseases of the digestive system: Secondary | ICD-10-CM

## 2015-12-20 NOTE — Telephone Encounter (Signed)
Please see the Gi referal I placed.

## 2015-12-20 NOTE — Telephone Encounter (Signed)
I filled out patient [employer] form. Will you make a copy and put in place that I can refer to in next 2 weeks in case they have question and I need to modify. Sara Norris won't be here tomorrow. But will you go ahead and try to fax those forms. Will place on your desk.

## 2015-12-21 NOTE — Telephone Encounter (Signed)
Pt made aware that forms have been faxed. No questions or concerns voiced.

## 2015-12-21 NOTE — Telephone Encounter (Signed)
Form faxed to [employer].  Fax confirmation received.  Copy made and placed in [employer] Saguier, PA-C red folder.  Original sent to be scanned.

## 2016-01-05 ENCOUNTER — Other Ambulatory Visit: Payer: Self-pay | Admitting: Internal Medicine

## 2016-01-05 NOTE — Telephone Encounter (Signed)
Pt is requesting refill on Ambien.  Last OV: 06/25/2015 Last Fill: 06/08/2015 #30 and 4RF UDS: Not needed for Ambien per PCP  Please advise.

## 2016-01-06 MED FILL — ZOLPIDEM TARTRATE 10 MG TAB: 10 | 30 days supply | Qty: 30 | Fill #0

## 2016-01-06 NOTE — Telephone Encounter (Signed)
Rx printed, awaiting MD signature.  

## 2016-01-06 NOTE — Telephone Encounter (Signed)
Rx faxed to [facility].  

## 2016-01-06 NOTE — Telephone Encounter (Signed)
Ok 30 and 4 RF 

## 2016-01-17 ENCOUNTER — Telehealth: Payer: Self-pay | Admitting: Medical

## 2016-01-17 NOTE — Telephone Encounter (Signed)
Paperwork re-faxed and confirmation received on 01/17/16.

## 2016-01-17 NOTE — Telephone Encounter (Signed)
Addendum form filled out for fmla paper work. Will you fax over paperwork.

## 2016-03-02 MED FILL — ZOLPIDEM TARTRATE 10 MG TAB: 10 | 30 days supply | Qty: 30 | Fill #1

## 2016-03-30 MED FILL — CARVEDILOL 6.25 MG TABLET: 6.25 | 30 days supply | Qty: 60 | Fill #3

## 2016-04-20 MED FILL — ZOLPIDEM TARTRATE 10 MG TAB: 10 | 30 days supply | Qty: 30 | Fill #2

## 2016-06-08 MED FILL — ZOLPIDEM TARTRATE 10 MG TAB: 10 | 30 days supply | Qty: 30 | Fill #3

## 2016-07-07 ENCOUNTER — Other Ambulatory Visit: Payer: Self-pay | Admitting: Internal Medicine

## 2016-07-28 ENCOUNTER — Ambulatory Visit (INDEPENDENT_AMBULATORY_CARE_PROVIDER_SITE_OTHER): Payer: 59 | Admitting: Internal Medicine

## 2016-07-28 ENCOUNTER — Encounter: Payer: Self-pay | Admitting: Internal Medicine

## 2016-07-28 VITALS — BP 134/80 | HR 91 | Temp 98.1°F | Resp 14 | Ht 68.0 in | Wt 201.4 lb

## 2016-07-28 DIAGNOSIS — Z1231 Encounter for screening mammogram for malignant neoplasm of breast: Secondary | ICD-10-CM

## 2016-07-28 DIAGNOSIS — Z91018 Allergy to other foods: Secondary | ICD-10-CM

## 2016-07-28 DIAGNOSIS — I10 Essential (primary) hypertension: Secondary | ICD-10-CM

## 2016-07-28 DIAGNOSIS — Z01419 Encounter for gynecological examination (general) (routine) without abnormal findings: Secondary | ICD-10-CM

## 2016-07-28 DIAGNOSIS — R739 Hyperglycemia, unspecified: Secondary | ICD-10-CM | POA: Diagnosis not present

## 2016-07-28 LAB — TSH: TSH: 1.03 m[IU]/L

## 2016-07-28 MED ORDER — CARVEDILOL 6.25 MG PO TABS: 6.2500 mg | ORAL_TABLET | Freq: Two times a day (BID) | ORAL | 5 refills | Status: DC

## 2016-07-28 MED ORDER — POTASSIUM CHLORIDE ER 10 MEQ PO TBCR: 10.0000 meq | EXTENDED_RELEASE_TABLET | Freq: Every day | ORAL | 5 refills | Status: DC

## 2016-07-28 MED ORDER — ZOLPIDEM TARTRATE 10 MG PO TABS: 10.0000 mg | ORAL_TABLET | Freq: Every evening | ORAL | 3 refills | Status: DC | PRN

## 2016-07-28 MED ORDER — LOSARTAN POTASSIUM-HCTZ 100-12.5 MG PO TABS: 1.0000 | ORAL_TABLET | Freq: Every day | ORAL | 5 refills | Status: DC

## 2016-07-28 MED FILL — CARVEDILOL 6.25 MG TABLET: 6.25 | 30 days supply | Qty: 60 | Fill #0

## 2016-07-28 MED FILL — POTASSIUM CL 10 MEQ TAB SA: 10 | 30 days supply | Qty: 30 | Fill #0

## 2016-07-28 NOTE — Progress Notes (Signed)
Subjective:    Patient ID: Sara Norris, female    DOB: 1965-08-23, 51 y.o.   MRN: [id number]  DOS:  07/28/2016 Type of visit - description : Routine checkup Interval history: HTN: Good compliance of medication, very rarely check her BPs, she did remember one time it was elevated. History of diverticulitis: Need a new FMLA form. History of hyperglycemia: Per chart review, A1c was elevated, needs labs. Insomnia: Ran out of Ambien, not sleeping well.  BP Readings from Last 3 Encounters:  07/28/16 134/80  12/16/15 (!) 184/118  11/19/15 (!) 180/100     Review of Systems  Denies chest pain, difficulty breathing or lower extremity edema. Diet: Trying to watch more closely Exercise: Started to walk twice a week in addition to being active at work.  Past Medical History:  Diagnosis Date  . Chronic knee pain   . Colonic diverticular abscess   . Contraception    menopausal  . Diverticulosis   . Hiatal hernia   . Hypertension    dx age 27  . Menopause    LMP age 38  . Umbilical hernia     Past Surgical History:  Procedure Laterality Date  . CHOLECYSTECTOMY    . TONSILLECTOMY      Social History   Social History  . Marital status: Single    Spouse name: N/A  . Number of children: 1  . Years of education: N/A   Occupational History  . enviromental services  [employer]   Social History Main Topics  . Smoking status: Never Smoker  . Smokeless tobacco: Never Used  . Alcohol use Yes  . Drug use: No     Comment: no marihuana ;lately   . Sexual activity: Not on file   Other Topics Concern  . 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 Always use your most recent med list.          aspirin EC 81 MG tablet Take 81 mg by  mouth daily.   carvedilol 6.25 MG tablet Commonly known as:  COREG Take 1 tablet (6.25 mg total) by mouth 2 (two) times daily with a meal.   losartan-hydrochlorothiazide 100-12.5 MG tablet Commonly known as:  HYZAAR TAKE 1 TABLET BY MOUTH DAILY.   potassium chloride 10 MEQ tablet Commonly known as:  K-DUR Take 1 tablet (10 mEq total) by mouth daily.   zolpidem 10 MG tablet Commonly known as:  AMBIEN Take 1 tablet (10 mg total) by mouth at bedtime as needed for sleep.          Objective:   Physical Exam BP 134/80 (BP Location: Left Arm, Patient Position: Sitting, Cuff Size: Normal)   Pulse 91   Temp 98.1 F (36.7 C) (Oral)   Resp 14   Ht 5\' 8"  (1.727 m)   Wt 201 lb 6 oz (91.3 kg)   SpO2 97%   BMI 30.62 kg/m  General:   Well developed, well nourished . NAD.  HEENT:  Normocephalic . Face symmetric, atraumatic Lungs:  CTA B Normal respiratory effort, no intercostal retractions, no accessory muscle use.  Heart: RRR,  no murmur.  No pretibial edema bilaterally  Skin: Not pale. Not jaundice Neurologic:  alert & oriented X3.  Speech normal, gait appropriate for age and unassisted Psych--  Cognition and judgment appear intact.  Cooperative with normal attention span and concentration.  Behavior appropriate. No anxious or depressed appearing.      Assessment & Plan:   Assessment > Prediabetes: A1c 6.1 08-2014 HTN DX age 51 Insomnia - on ambien Early menopause - declines HRT H/o Knee pain Diverticulitis, 1st episode 02-2015, + perf, conservative Rx  History of food allergies, citrus?. Has an EpiPen  PLAN: Prediabetes: Check up A1c HTN: Refill carvedilol, Hyzaar and potassium. Checking a BMP and TSH. BP  today looks good. Rec to check in the ambulatory setting. She is doing better with diet and exercise. Praised H/o diverticulitis: No recent events but needs FMLA papers, will receive a form. Insomnia: Refill Ambien Allergies: Reports a question of food allergy  and carries an EpiPen, request a referral, likes to be tested.Will refer to the allergist. Primary care: Needs a new gynecologist is, [facility] Last mammogram 2014, will set up a follow-up. I explained her the importance to do CPX, encouraged to come back in 4 months, CPX.

## 2016-07-28 NOTE — Progress Notes (Signed)
Pre visit review using our clinic review tool, if applicable. No additional management support is needed unless otherwise documented below in the visit note. 

## 2016-07-28 NOTE — Patient Instructions (Signed)
GO TO THE LAB : Get the blood work     GO TO THE FRONT DESK Schedule your next appointment for a  physical exam in 4 months.    Check the  blood pressure 2 or 3 times a month   Be sure your blood pressure is between 110/65 and  140/85. If it is consistently higher or lower, let me know   We are referring you to have a mammogram  We are referring you to a gynecologist

## 2016-07-29 LAB — HEMOGLOBIN A1C
HEMOGLOBIN A1C: 5.5 % (ref <5.7)
Mean Plasma Glucose: 111 mg/dL

## 2016-07-29 LAB — BASIC METABOLIC PANEL
BUN: 20 mg/dL (ref 7–25)
CALCIUM: 9.1 mg/dL (ref 8.6–10.4)
CO2: 23 mmol/L (ref 20–31)
Chloride: 106 mmol/L (ref 98–110)
Creat: 0.9 mg/dL (ref 0.50–1.05)
GLUCOSE: 81 mg/dL (ref 65–99)
Potassium: 3.6 mmol/L (ref 3.5–5.3)
SODIUM: 141 mmol/L (ref 135–146)

## 2016-07-30 NOTE — Assessment & Plan Note (Signed)
Prediabetes: Check up A1c HTN: Refill carvedilol, Hyzaar and potassium. Checking a BMP and TSH. BP  today looks good. Rec to check in the ambulatory setting. She is doing better with diet and exercise. Praised H/o diverticulitis: No recent events but needs FMLA papers, will receive a form. Insomnia: Refill Ambien Allergies: Reports a question of food allergy and carries an EpiPen, request a referral, likes to be tested.Will refer to the allergist. Primary care: Needs a new gynecologist is, [facility] Last mammogram 2014, will set up a follow-up. I explained her the importance to do CPX, encouraged to come back in 4 months, CPX.

## 2016-07-31 ENCOUNTER — Telehealth: Payer: Self-pay | Admitting: Internal Medicine

## 2016-07-31 MED FILL — ZOLPIDEM TARTRATE 10 MG TAB: 10 | 30 days supply | Qty: 30 | Fill #0

## 2016-07-31 NOTE — Telephone Encounter (Signed)
Spoke w/ Carina whom spoke w/ [employer], she just needs dates updated on FMLA forms and [phone]. Dates updated, faxed and forms sent for scanning.

## 2016-07-31 NOTE — Telephone Encounter (Signed)
Caller name: Relationship to patient: Self Can be reached: [phone] Pharmacy:  Reason for call: Patient request call back to discuss FMLA Form

## 2016-08-08 ENCOUNTER — Ambulatory Visit: Payer: 59 | Admitting: Obstetrics & Gynecology

## 2016-08-08 DIAGNOSIS — H5213 Myopia, bilateral: Secondary | ICD-10-CM | POA: Diagnosis not present

## 2016-08-11 ENCOUNTER — Ambulatory Visit (INDEPENDENT_AMBULATORY_CARE_PROVIDER_SITE_OTHER): Payer: 59 | Admitting: Obstetrics & Gynecology

## 2016-08-11 ENCOUNTER — Encounter: Payer: Self-pay | Admitting: Obstetrics & Gynecology

## 2016-08-11 VITALS — Ht 66.0 in | Wt 202.0 lb

## 2016-08-11 DIAGNOSIS — N952 Postmenopausal atrophic vaginitis: Secondary | ICD-10-CM

## 2016-08-11 DIAGNOSIS — Z01411 Encounter for gynecological examination (general) (routine) with abnormal findings: Secondary | ICD-10-CM | POA: Diagnosis not present

## 2016-08-11 DIAGNOSIS — Z1151 Encounter for screening for human papillomavirus (HPV): Secondary | ICD-10-CM | POA: Diagnosis not present

## 2016-08-11 DIAGNOSIS — Z113 Encounter for screening for infections with a predominantly sexual mode of transmission: Secondary | ICD-10-CM | POA: Diagnosis not present

## 2016-08-11 MED ORDER — ESTRADIOL 0.1 MG/GM VA CREA: 0.2500 | TOPICAL_CREAM | VAGINAL | 4 refills | Status: DC

## 2016-08-11 NOTE — Patient Instructions (Signed)
1. Encounter for gynecological examination with abnormal finding Normal Gyn exam except Atrophic Vaginitis.  Pap/HPV done.  Breasts wnl.  Will schedule screening Mammo.  2. Post-menopausal atrophic vaginitis Early menopause in late 30's.  No HRT.  C/O Vaginal dryness with IC.  Will try Estradiol cream as prescribed.  [place] PRN as well.  3. Screen for STD (sexually transmitted disease) Gono-Chlam on pap. - HIV antibody - Hepatitis C Antibody - Hepatitis B Surface AntiGEN - RPR   Sara Norris, it was a pleasure to meet you today!  I will inform you of your results as soon as available.

## 2016-08-11 NOTE — Progress Notes (Signed)
Sara Norris 02/19/1966 [id number]   History:    51 y.o.  G3P1A2  Same partner x 6 years  RP:  New patient presenting for annual gyn exam   HPI:  Early menopause when she was late 21's.  Never prescribed any HRT.  No PMB.  No pelvic pain.  Has tolerable hot flashes and night sweats.  What she would like to address is her vaginal dryness with IC.  Uses a little KY, but not sufficient.  No vaginal d/c.  Breasts wnl.  Mictions normal/BMs normal.  Past medical history,surgical history, family history and social history were all reviewed and documented in the EPIC chart.  Gynecologic History No LMP recorded. Patient is postmenopausal. Contraception: post menopausal status Last Pap: 2011. Results were: normal Last mammogram: 2014. Results were: normal  Obstetric History OB History  Gravida Para Term Preterm AB Living  3 1     2 1   SAB TAB Ectopic Multiple Live Births               # Outcome Date GA Lbr Len/2nd Weight Sex Delivery Anes PTL Lv  3 AB           2 AB           1 Para                ROS: A ROS was performed and pertinent positives and negatives are included in the history.  GENERAL: No fevers or chills. HEENT: No change in vision, no earache, sore throat or sinus congestion. NECK: No pain or stiffness. CARDIOVASCULAR: No chest pain or pressure. No palpitations. PULMONARY: No shortness of breath, cough or wheeze. GASTROINTESTINAL: No abdominal pain, nausea, vomiting or diarrhea, melena or bright red blood per rectum. GENITOURINARY: No urinary frequency, urgency, hesitancy or dysuria. MUSCULOSKELETAL: No joint or muscle pain, no back pain, no recent trauma. DERMATOLOGIC: No rash, no itching, no lesions. ENDOCRINE: No polyuria, polydipsia, no heat or cold intolerance. No recent change in weight. HEMATOLOGICAL: No anemia or easy bruising or bleeding. NEUROLOGIC: No headache, seizures, numbness, tingling or weakness. PSYCHIATRIC: No depression, no loss of interest in normal  activity or change in sleep pattern.     Exam:   Ht 5\' 6"  (1.676 m)   Wt 202 lb (91.6 kg)   BMI 32.60 kg/m   Body mass index is 32.6 kg/m.  General appearance : Well developed well nourished female. No acute distress HEENT: Eyes: no retinal hemorrhage or exudates,  Neck supple, trachea midline, no carotid bruits, no thyroidmegaly Lungs: Clear to auscultation, no rhonchi or wheezes, or rib retractions  Heart: Regular rate and rhythm, no murmurs or gallops Breast:Examined in sitting and supine position were symmetrical in appearance, no palpable masses or tenderness,  no skin retraction, no nipple inversion, no nipple discharge, no skin discoloration, no axillary or supraclavicular lymphadenopathy Abdomen: no palpable masses or tenderness, no rebound or guarding Extremities: no edema or skin discoloration or tenderness  Pelvic:  Bartholin, Urethra, Skene Glands: Within normal limits             Vagina: No gross lesions or discharge  Cervix: No gross lesions or discharge.  Pap/HPV/Chlam-Gono done.  Uterus  AV, normal size, shape and consistency, non-tender and mobile  Adnexa  Without masses or tenderness  Anus and perineum  normal    Assessment/Plan:  51 y.o. female for annual exam   1. Encounter for gynecological examination with abnormal finding Normal Gyn exam except Atrophic  Vaginitis.  Pap/HPV done.  Breasts wnl.  Will schedule screening Mammo.  2. Post-menopausal atrophic vaginitis Early menopause in late 30's.  No HRT.  C/O Vaginal dryness with IC.  Will try Estradiol cream as prescribed.  KY PRN as well.  3. Screen for STD (sexually transmitted disease) Gono-Chlam on pap. - HIV antibody - Hepatitis C Antibody - Hepatitis B Surface AntiGEN - RPR  Counseling on above issues >50% x 10 minutes.   Genia DelMarie-Lyne Zebulen Simonis MD, 4:18 PM 08/11/2016

## 2016-08-12 LAB — HEPATITIS B SURFACE ANTIGEN: Hepatitis B Surface Ag: NEGATIVE

## 2016-08-12 LAB — HIV ANTIBODY (ROUTINE TESTING W REFLEX): HIV 1&2 Ab, 4th Generation: NONREACTIVE

## 2016-08-12 LAB — RPR

## 2016-08-12 LAB — HEPATITIS C ANTIBODY: HCV AB: NEGATIVE

## 2016-08-14 ENCOUNTER — Telehealth: Payer: Self-pay

## 2016-08-14 NOTE — Telephone Encounter (Signed)
Yes twice weekly.  Each time 1/4 of an applicator vaginally and a thin layer on the vulva.

## 2016-08-14 NOTE — Telephone Encounter (Signed)
Called to clarify instructions on Estradiol Vaginal Cream as it had 2 directions on Rx.   Do you want her to use it vaginally twice weekly as well as thin layer on vulva two times weekly?  He just wanted to clarify.

## 2016-08-14 NOTE — Telephone Encounter (Signed)
Pharmacy notified.

## 2016-08-15 LAB — PAP IG, CT-NG NAA, HPV HIGH-RISK
CHLAMYDIA PROBE AMP: NOT DETECTED
GC PROBE AMP: NOT DETECTED
HPV DNA High Risk: NOT DETECTED

## 2016-09-14 MED FILL — ZOLPIDEM TARTRATE 10 MG TAB: 10 | 30 days supply | Qty: 30 | Fill #1

## 2016-09-19 ENCOUNTER — Ambulatory Visit: Payer: 59 | Admitting: Allergy and Immunology

## 2016-10-30 MED FILL — ZOLPIDEM TARTRATE 10 MG TAB: 10 | 30 days supply | Qty: 30 | Fill #2

## 2016-12-08 MED FILL — ZOLPIDEM TARTRATE 10 MG TAB: 10 | 30 days supply | Qty: 30 | Fill #3

## 2017-01-04 ENCOUNTER — Other Ambulatory Visit: Payer: Self-pay | Admitting: Internal Medicine

## 2017-01-04 MED FILL — CARVEDILOL 6.25 MG TABLET: 6.25 | 30 days supply | Qty: 60 | Fill #1

## 2017-01-04 NOTE — Telephone Encounter (Signed)
Pt is requesting refill on Ambien 10mg .  Last OV: 07/28/2016 Last Fill: 07/28/2016 #30 and 3RF UDS: Not needed for Ambien per PCP  NCCR printed; no issues noted  Please advise.

## 2017-01-04 NOTE — Telephone Encounter (Signed)
#  30, 3 refills

## 2017-01-05 MED FILL — ZOLPIDEM TARTRATE 10 MG TAB: 10 | 30 days supply | Qty: 30 | Fill #0

## 2017-01-05 NOTE — Telephone Encounter (Signed)
Rx faxed to [facility].  

## 2017-01-05 NOTE — Telephone Encounter (Signed)
Rx printed, awaiting MD signature.  

## 2017-02-12 MED FILL — ZOLPIDEM TARTRATE 10 MG TAB: 10 | 30 days supply | Qty: 30 | Fill #1

## 2017-03-13 ENCOUNTER — Encounter: Payer: 59 | Admitting: Internal Medicine

## 2017-03-19 MED FILL — ZOLPIDEM TARTRATE 10 MG TAB: 10 | 30 days supply | Qty: 30 | Fill #2

## 2017-04-03 ENCOUNTER — Encounter: Payer: 59 | Admitting: Internal Medicine

## 2017-04-20 MED FILL — ZOLPIDEM TARTRATE 10 MG TAB: 10 | 30 days supply | Qty: 30 | Fill #3

## 2017-04-23 ENCOUNTER — Telehealth: Payer: Self-pay | Admitting: *Deleted

## 2017-04-23 NOTE — Telephone Encounter (Signed)
Received FMLA/STD paperwork from [employer], cannot complete due to No Known dates of patient being out of work, last FMLA was for Diverticulitis for dates in 10/2015 Sara Norris[seen by Ramon DredgeEdward, PA]. Last OV was 07/28/16; patient cancelled appts for 03/13/17 & 04/03/17; forwarded to provider with copies & notes for advisement/SLS 03/04

## 2017-05-04 ENCOUNTER — Ambulatory Visit (INDEPENDENT_AMBULATORY_CARE_PROVIDER_SITE_OTHER): Payer: 59 | Admitting: Internal Medicine

## 2017-05-04 ENCOUNTER — Encounter: Payer: Self-pay | Admitting: Internal Medicine

## 2017-05-04 VITALS — BP 136/74 | HR 74 | Temp 98.0°F | Resp 14 | Ht 66.0 in | Wt 206.4 lb

## 2017-05-04 DIAGNOSIS — Z0000 Encounter for general adult medical examination without abnormal findings: Secondary | ICD-10-CM | POA: Diagnosis not present

## 2017-05-04 DIAGNOSIS — Z23 Encounter for immunization: Secondary | ICD-10-CM | POA: Diagnosis not present

## 2017-05-04 DIAGNOSIS — Z1211 Encounter for screening for malignant neoplasm of colon: Secondary | ICD-10-CM

## 2017-05-04 LAB — CBC WITH DIFFERENTIAL/PLATELET
Basophils Absolute: 0.1 10*3/uL (ref 0.0–0.1)
Basophils Relative: 1.9 % (ref 0.0–3.0)
EOS ABS: 0.2 10*3/uL (ref 0.0–0.7)
EOS PCT: 4 % (ref 0.0–5.0)
HCT: 38.1 % (ref 36.0–46.0)
Hemoglobin: 12.7 g/dL (ref 12.0–15.0)
LYMPHS ABS: 2.6 10*3/uL (ref 0.7–4.0)
Lymphocytes Relative: 50.9 % — ABNORMAL HIGH (ref 12.0–46.0)
MCHC: 33.4 g/dL (ref 30.0–36.0)
MCV: 90.4 fl (ref 78.0–100.0)
MONO ABS: 0.4 10*3/uL (ref 0.1–1.0)
Monocytes Relative: 7.2 % (ref 3.0–12.0)
NEUTROS PCT: 36 % — AB (ref 43.0–77.0)
Neutro Abs: 1.9 10*3/uL (ref 1.4–7.7)
Platelets: 359 10*3/uL (ref 150.0–400.0)
RBC: 4.21 Mil/uL (ref 3.87–5.11)
RDW: 13.4 % (ref 11.5–15.5)
WBC: 5.2 10*3/uL (ref 4.0–10.5)

## 2017-05-04 LAB — LIPID PANEL
CHOL/HDL RATIO: 3
Cholesterol: 205 mg/dL — ABNORMAL HIGH (ref 0–200)
HDL: 66.7 mg/dL (ref >39.00)
LDL CALC: 118 mg/dL — AB (ref 0–99)
NonHDL: 138.49
Triglycerides: 100 mg/dL (ref 0.0–149.0)
VLDL: 20 mg/dL (ref 0.0–40.0)

## 2017-05-04 LAB — COMPREHENSIVE METABOLIC PANEL
ALK PHOS: 90 U/L (ref 39–117)
ALT: 17 U/L (ref 0–35)
AST: 14 U/L (ref 0–37)
Albumin: 4.1 g/dL (ref 3.5–5.2)
BUN: 19 mg/dL (ref 6–23)
CHLORIDE: 103 meq/L (ref 96–112)
CO2: 32 meq/L (ref 19–32)
Calcium: 9.5 mg/dL (ref 8.4–10.5)
Creatinine, Ser: 0.87 mg/dL (ref 0.40–1.20)
GFR: 88.06 mL/min (ref >60.00)
GLUCOSE: 95 mg/dL (ref 70–99)
POTASSIUM: 4.2 meq/L (ref 3.5–5.1)
SODIUM: 139 meq/L (ref 135–145)
Total Bilirubin: 0.5 mg/dL (ref 0.2–1.2)
Total Protein: 7.1 g/dL (ref 6.0–8.3)

## 2017-05-04 LAB — HEMOGLOBIN A1C: HEMOGLOBIN A1C: 5.8 % (ref 4.6–6.5)

## 2017-05-04 NOTE — Progress Notes (Signed)
Subjective:    Patient ID: Narda BondsLorrie A Wandel, female    DOB: [birth year], 52 y.o.   MRN: [id number]  DOS:  05/04/2017 Type of visit - description : CPX Interval history: Here for a CPX, she has few concerns  BP Readings from Last 3 Encounters:  05/04/17 136/74  07/28/16 134/80  12/16/15 (!) 184/118     Review of Systems Bleeding from a area of the gum that looks irritated. She works in the hospital doing cleaning and mopping frequently, report hand pains on and off.  Other than above, a 14 point review of systems is negative    Past Medical History:  Diagnosis Date  . Chronic knee pain   . Colonic diverticular abscess   . Contraception    menopausal  . Diverticulosis   . Hiatal hernia   . Hypertension    dx age 52  . Menopause    LMP age [age]  . Umbilical hernia     Past Surgical History:  Procedure Laterality Date  . CHOLECYSTECTOMY    . TONSILLECTOMY      Social History   Socioeconomic History  . Marital status: Single    Spouse name: Not on file  . Number of children: 1  . Years of education: Not on file  . Highest education level: Not on file  Social Needs  . Financial resource strain: Not on file  . Food insecurity - worry: Not on file  . Food insecurity - inability: Not on file  . Transportation needs - medical: Not on file  . Transportation needs - non-medical: Not on file  Occupational History  . Occupation: Doctor, general practiceenviromental services     Employer: [employer]  Tobacco Use  . Smoking status: Never Smoker  . Smokeless tobacco: Never Used  Substance and Sexual Activity  . Alcohol use: Yes    Comment: SOCIAL   . Drug use: No    Comment: no marihuana ;lately   . Sexual activity: Yes    Partners: Male    Comment: 1ST INTERCOURSE- 5216, PARTNERS- 5  Other Topics Concern  . 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 Hypertension Mother   . Diabetes Mother   . Hypertension Father   . Diabetes Father        GP  . Hypertension Unknown        GP  . CAD Maternal Grandmother   . Stroke Neg Hx   . Colon cancer Neg Hx   . Breast cancer Neg Hx      Allergies as of 05/04/2017      Reactions   Citrus Rash   Codeine Rash   Pollen Extract    sneezing      Medication List        Accurate as of 05/04/17  5:58 PM. Always use your most recent med list.          aspirin EC 81 MG tablet Take 81 mg by mouth daily.   carvedilol 6.25 MG tablet Commonly known as:  COREG Take 1 tablet (6.25 mg total) by mouth 2 (two) times daily with a meal.   EPINEPHrine 0.3 mg/0.3 mL Soaj injection Commonly known as:  EPI-PEN Inject 0.3 mg into the muscle once.   losartan-hydrochlorothiazide 100-12.5 MG tablet Commonly known as:  HYZAAR Take 1 tablet by mouth daily.   potassium chloride 10 MEQ tablet Commonly known as:  K-DUR Take 1 tablet (10 mEq total) by mouth daily.   Vitamin D3 400 units tablet Take 400 Units by mouth daily.   zolpidem 10 MG tablet Commonly known as:  AMBIEN Take 1 tablet (10 mg total) at bedtime as needed by mouth for sleep.          Objective:   Physical Exam  HENT:  Mouth/Throat:     BP 136/74 (BP Location: Left Arm, Patient Position: Sitting, Cuff Size: Normal)   Pulse 74   Temp 98 F (36.7 C) (Oral)   Resp 14   Ht 5\' 6"  (1.676 m)   Wt 206 lb 6 oz (93.6 kg)   SpO2 97%   BMI 33.31 kg/m  General:   Well developed, well nourished . NAD.  Neck: No  thyromegaly  HEENT:  Normocephalic . Face symmetric, atraumatic Lungs:  CTA B Normal respiratory effort, no intercostal retractions, no accessory muscle use. Heart: RRR,  no murmur.  No pretibial edema bilaterally  Abdomen:  Not distended, soft, non-tender. No rebound or rigidity.   MSK: Hands and wrists without synovitis Skin: Exposed areas without rash. Not pale. Not jaundice Neurologic:  alert &  oriented X3.  Speech normal, gait appropriate for age and unassisted Strength symmetric and appropriate for age.  Psych: Cognition and judgment appear intact.  Cooperative with normal attention span and concentration.  Behavior appropriate. No anxious or depressed appearing.     Assessment & Plan:   Assessment > Prediabetes: A1c 6.1 08-2014 HTN DX age 73 Insomnia - on ambien Early menopause - declines HRT H/o Knee pain Diverticulitis, 1st episode 02-2015, + perf, conservative Rx  History of food allergies, citrus?. Has an EpiPen  PLAN: DM:: Check a A1c HTN: No frequent or recent BPs, rx amb BPs, continue carvedilol, Hyzaar, potassium. Insomnia: Controlled Needs to see dentist ASAP RTC 6 months.

## 2017-05-04 NOTE — Assessment & Plan Note (Signed)
DM:: Check a A1c HTN: No frequent or recent BPs, rx amb BPs, continue carvedilol, Hyzaar, potassium. Insomnia: Controlled Needs to see dentist ASAP RTC 6 months.

## 2017-05-04 NOTE — Assessment & Plan Note (Signed)
-   Td today --CCS: never had a cscope, h/o diverticulitis, wil refer to GI --female care: saw gyn 07-2016; plans to get a MMG, declined referral --diet, exercise: discussed --labs: CMP, FLP, A1c, CBC

## 2017-05-04 NOTE — Progress Notes (Signed)
Pre visit review using our clinic review tool, if applicable. No additional management support is needed unless otherwise documented below in the visit note. 

## 2017-05-04 NOTE — Patient Instructions (Signed)
GO TO THE LAB : Get the blood work     GO TO THE FRONT DESK Schedule your next appointment for a checkup in 6 months  See your dentist ASAP  Check the  blood pressure 2 or 3 times a month   Be sure your blood pressure is between 110/65 and  135/85. If it is consistently higher or lower, let me know  Okay to take 1 or 2 ibuprofens OTC for hand pain.

## 2017-05-21 ENCOUNTER — Telehealth: Payer: Self-pay | Admitting: Internal Medicine

## 2017-05-21 MED FILL — ZOLPIDEM TARTRATE 10 MG TAB: 10 | 30 days supply | Qty: 30 | Fill #0

## 2017-05-21 NOTE — Telephone Encounter (Signed)
Pt is requesting refill on zolpidem 10mg .   Last OV: 05/04/2017 Last Fill: 01/05/2017 #30 and 3rf UDS: Not needed for Ambien per PCP  NCCR printed- no discrepancies noted- sent for scanning.   Please advise.

## 2017-05-21 NOTE — Telephone Encounter (Signed)
Sent!

## 2017-06-07 ENCOUNTER — Encounter: Payer: Self-pay | Admitting: Internal Medicine

## 2017-06-07 ENCOUNTER — Ambulatory Visit (AMBULATORY_SURGERY_CENTER): Payer: 59

## 2017-06-07 VITALS — Ht 68.0 in | Wt 203.0 lb

## 2017-06-07 DIAGNOSIS — Z1211 Encounter for screening for malignant neoplasm of colon: Secondary | ICD-10-CM

## 2017-06-07 MED ORDER — NA SULFATE-K SULFATE-MG SULF 17.5-3.13-1.6 GM/177ML PO SOLN: 1.0000 | Freq: Once | ORAL | 0 refills | Status: AC

## 2017-06-07 MED FILL — SUPREP BOWEL PREP KIT: 17.5-3.13-1 | 1 days supply | Qty: 354 | Fill #0

## 2017-06-07 NOTE — Progress Notes (Addendum)
Per pt, no allergies to egg products.Pt not taking any weight loss meds or using  O2 at home.   Per pt- SOY ALLERGY- causes itching of throat!   Pt refused emmi video.

## 2017-06-14 MED FILL — CARVEDILOL 6.25 MG TABLET: 6.25 | 30 days supply | Qty: 60 | Fill #2

## 2017-06-19 ENCOUNTER — Telehealth: Payer: Self-pay | Admitting: Internal Medicine

## 2017-06-19 NOTE — Telephone Encounter (Signed)
Hi Dr. Rhea Belton, this pt cancelled her procedure for tomorrow 06/20/17 at 11:00am. She stated to have issues finding a care partner. She will call back to r/s. Thank you.

## 2017-06-20 ENCOUNTER — Encounter: Payer: 59 | Admitting: Internal Medicine

## 2017-06-20 MED FILL — ZOLPIDEM TARTRATE 10 MG TAB: 10 | 30 days supply | Qty: 30 | Fill #1

## 2017-06-21 ENCOUNTER — Encounter: Payer: 59 | Admitting: Internal Medicine

## 2017-07-23 ENCOUNTER — Emergency Department (HOSPITAL_COMMUNITY): Payer: 59

## 2017-07-23 ENCOUNTER — Encounter (HOSPITAL_COMMUNITY): Payer: Self-pay | Admitting: Emergency Medicine

## 2017-07-23 ENCOUNTER — Other Ambulatory Visit: Payer: Self-pay

## 2017-07-23 ENCOUNTER — Emergency Department (HOSPITAL_COMMUNITY)
Admission: EM | Admit: 2017-07-23 | Discharge: 2017-07-23 | Disposition: A | Discharge status: Home / Self Care | Payer: 59 | Attending: Emergency Medicine | Admitting: Emergency Medicine

## 2017-07-23 DIAGNOSIS — Z79899 Other long term (current) drug therapy: Secondary | ICD-10-CM | POA: Diagnosis not present

## 2017-07-23 DIAGNOSIS — Y999 Unspecified external cause status: Secondary | ICD-10-CM | POA: Insufficient documentation

## 2017-07-23 DIAGNOSIS — G8929 Other chronic pain: Secondary | ICD-10-CM | POA: Insufficient documentation

## 2017-07-23 DIAGNOSIS — M25461 Effusion, right knee: Secondary | ICD-10-CM | POA: Diagnosis not present

## 2017-07-23 DIAGNOSIS — Y929 Unspecified place or not applicable: Secondary | ICD-10-CM | POA: Insufficient documentation

## 2017-07-23 DIAGNOSIS — M25561 Pain in right knee: Secondary | ICD-10-CM | POA: Diagnosis not present

## 2017-07-23 DIAGNOSIS — W19XXXA Unspecified fall, initial encounter: Secondary | ICD-10-CM | POA: Insufficient documentation

## 2017-07-23 DIAGNOSIS — Z7982 Long term (current) use of aspirin: Secondary | ICD-10-CM | POA: Insufficient documentation

## 2017-07-23 DIAGNOSIS — I10 Essential (primary) hypertension: Secondary | ICD-10-CM | POA: Insufficient documentation

## 2017-07-23 DIAGNOSIS — Y939 Activity, unspecified: Secondary | ICD-10-CM | POA: Insufficient documentation

## 2017-07-23 DIAGNOSIS — S8001XA Contusion of right knee, initial encounter: Secondary | ICD-10-CM | POA: Insufficient documentation

## 2017-07-23 DIAGNOSIS — S0990XA Unspecified injury of head, initial encounter: Secondary | ICD-10-CM | POA: Diagnosis not present

## 2017-07-23 MED ORDER — ACETAMINOPHEN 325 MG PO TABS: 650.0000 mg | ORAL_TABLET | Freq: Once | ORAL | Status: DC

## 2017-07-23 NOTE — ED Notes (Signed)
STACEY RN DISCHARGED PT

## 2017-07-23 NOTE — Discharge Instructions (Signed)
Tylenol for pain

## 2017-07-23 NOTE — ED Triage Notes (Signed)
Pt c/o right knee pain from fall on Saturday. Reports hasnt taken her HTN meds today

## 2017-07-23 NOTE — ED Provider Notes (Signed)
[facility]-EMERGENCY DEPT Provider Note   CSN: [id number] Arrival date & time: 07/23/17  0815     History   Chief Complaint Chief Complaint  Patient presents with  . Fall  . Knee Pain    HPI Sara Norris is a 52 y.o. female.  The history is provided by the patient. No language interpreter was used.  Fall  This is a new problem. Episode onset: 3 days. The problem occurs constantly. The problem has been gradually worsening. Nothing aggravates the symptoms. Nothing relieves the symptoms. She has tried nothing for the symptoms. The treatment provided no relief.  Knee Pain   This is a new problem. The problem has been gradually worsening. The pain is present in the right knee. The quality of the pain is described as aching. She has tried nothing for the symptoms. The treatment provided no relief. There has been no history of extremity trauma.   Pt reports she fell and hit her knee on Saturday.  Pt reports increased pain.   Past Medical History:  Diagnosis Date  . Allergy    seasonal  . Chronic knee pain   . Colonic diverticular abscess   . Contraception    menopausal  . Diverticulosis   . Hiatal hernia    per pt, not aware of this.  . Hypertension    dx age 83  . Menopause    LMP age 18  . Umbilical hernia    per pt/ nopt aware of this.    Patient Active Problem List   Diagnosis Date Noted  . Acute diverticulitis, h/o 03/08/2015  . PCP NOTES >>>>> 01/16/2015  . Insomnia 06/17/2013  . General medical examination 05/24/2010  . Essential hypertension 04/09/2009    Past Surgical History:  Procedure Laterality Date  . CHOLECYSTECTOMY    . MYOMECTOMY     removed fibroids/ when she was in her 30"s  . TONSILLECTOMY       OB History    Gravida  3   Para  1   Term      Preterm      AB  2   Living  1     SAB      TAB      Ectopic      Multiple      Live Births               Home Medications    Prior to Admission  medications   Medication Sig Start Date End Date Taking? Authorizing Provider  aspirin EC 81 MG tablet Take 81 mg by mouth daily.    [provider]  carvedilol (COREG) 6.25 MG tablet Take 1 tablet (6.25 mg total) by mouth 2 (two) times daily with a meal. 07/28/16   Wanda Plump, MD  Cholecalciferol (VITAMIN D3) 400 units tablet Take 400 Units by mouth daily.    [provider]  EPINEPHrine 0.3 mg/0.3 mL IJ SOAJ injection Inject 0.3 mg into the muscle once.    [provider]  losartan-hydrochlorothiazide (HYZAAR) 100-12.5 MG tablet Take 1 tablet by mouth daily. Patient not taking: Reported on 06/07/2017 07/28/16   Wanda Plump, MD  potassium chloride (K-DUR) 10 MEQ tablet Take 1 tablet (10 mEq total) by mouth daily. Patient taking differently: Take 10 mEq by mouth as needed.  07/28/16   Wanda Plump, MD  zolpidem (AMBIEN) 10 MG tablet Take 1 tablet (10 mg total) by mouth at bedtime as needed  for sleep. 05/21/17   Wanda PlumpPaz, Jose E, MD    Family History Family History  Problem Relation Age of Onset  . Hypertension Mother   . Diabetes Mother   . Arthritis Mother   . Gout Mother   . Hypertension Father   . Diabetes Father        GP  . Hypertension Unknown        GP  . CAD Maternal Grandmother   . Stroke Neg Hx   . Colon cancer Neg Hx   . Breast cancer Neg Hx     Social History Social History   Tobacco Use  . Smoking status: Never Smoker  . Smokeless tobacco: Never Used  Substance Use Topics  . Alcohol use: Yes    Comment: SOCIAL   . Drug use: No    Comment: no marihuana ;lately      Allergies   Soy allergy; Citrus; Codeine; and Pollen extract   Review of Systems Review of Systems  All other systems reviewed and are negative.    Physical Exam Updated Vital Signs BP (!) 209/125 (BP Location: Left Arm) Comment: Pt has not taken BP meds today.  Pulse 83   Temp 98.9 F (37.2 C) (Oral)   Resp 18   Ht 5\' 8"  (1.727 m)   Wt 91.6 kg (202 lb)   SpO2 98%    BMI 30.71 kg/m   Physical Exam  Constitutional: She appears well-developed and well-nourished.  HENT:  Head: Normocephalic.  Eyes: Pupils are equal, round, and reactive to light.  Cardiovascular: Normal rate.  Pulmonary/Chest: Effort normal.  Musculoskeletal: She exhibits tenderness.  Swollen tender,  No bruising.  Good range of motion, slow,  nv and ns intact   Neurological: She is alert.  Skin: Skin is warm.  Psychiatric: She has a normal mood and affect.  Nursing note and vitals reviewed.    ED Treatments / Results  Labs (all labs ordered are listed, but only abnormal results are displayed) Labs Reviewed - No data to display  EKG None  Radiology Dg Knee Complete 4 Views Right  Result Date: 07/23/2017 CLINICAL DATA:  52 year old female status post fall on 07/21/2017 with pain and decreased range of motion today. EXAM: RIGHT KNEE - COMPLETE 4+ VIEW COMPARISON:  12/17/2013. FINDINGS: Small to moderate suprapatellar joint effusion visible on the cross-table lateral view. Degenerative osteophytosis of the patella and suprapatellar dystrophic calcification has increased since 2015. The dystrophic calcification might indicate quadriceps tendinopathy. The patella appears intact. Medial and lateral compartment degenerative spurring has mildly progressed. Joint spaces appear preserved. No acute osseous abnormality identified. IMPRESSION: 1. Small to moderate joint effusion suspected. 2. No fracture identified. Dystrophic calcification in the suprapatellar region might indicate quadriceps tendinopathy. 3. Tricompartmental joint degeneration with mild progression since 2015. Electronically Signed   By: Odessa FlemingH  Hall M.D.   On: 07/23/2017 08:55    Procedures Procedures (including critical care time)  Medications Ordered in ED Medications - No data to display   Initial Impression / Assessment and Plan / ED Course  I have reviewed the triage vital signs and the nursing notes.  Pertinent labs  & imaging results that were available during my care of the patient were reviewed by me and considered in my medical decision making (see chart for details).  Clinical Course as of Jul 23 925  Mon Jul 23, 2017  0920 BP(!): 209/125 [KT]    Clinical Course User Index [KT] Terence Luxhomas, Kelsey E, Student-PA    Pt  took medication while here in Ed for blood pressure.  Will recheck blood pressure.  Pt advised to see Dr. Drue Novel for recheck of blood pressure.  Pt placed in knee slleev and advised to see Dr. Despina Hick Orthopaedist for evaltuion  Final Clinical Impressions(s) / ED Diagnoses   Final diagnoses:  Acute pain of right knee  Contusion of right knee, initial encounter    ED Discharge Orders    None    An After Visit Summary was printed and given to the patient.    Elson Areas, PA-C 07/23/17 0935    Azalia Bilis, MD 07/24/17 [phone]

## 2017-07-23 NOTE — ED Notes (Signed)
PT TOOK HOME BLOOD PRESSURE MEDICATION

## 2017-07-24 ENCOUNTER — Telehealth: Payer: Self-pay | Admitting: *Deleted

## 2017-07-24 ENCOUNTER — Telehealth: Payer: Self-pay

## 2017-07-24 MED FILL — ZOLPIDEM TARTRATE 10 MG TAB: 10 | 30 days supply | Qty: 30 | Fill #2

## 2017-07-24 NOTE — Telephone Encounter (Addendum)
Spoke with patient on Fri, 08/03/17 to inform her to please have [employer] resend the forms to us and I will get them filled out in a timely manner  [the same day, if provider available]; pt understood & agreed/SLS   August 01, 2017 9:16 AM  Conrad Burlingtonanter, Kaylyn, CMA routed this conversation to Me   9:14 AM Louie BunPalacios Medina, Rosey Batheresa D routed this conversation to [facility] [place] [facility]  [place]ervillePalacios Medina, Rosey Batheresa D   9:12 AM  Note    Patient called today to let office know that [employer] has not yet received paperwork and that she did have a visit with Dr. Drue NovelPaz. She would like to talk to his CMA about this, if she can please call her back.       Spoke with patient about her March 2019 paperwork from [employer]: Note     04/23/17 9:27 AM   Received FMLA/STD paperwork from [employer], cannot complete due to No Known dates of patient being out of work, last FMLA was for Diverticulitis for dates in 10/2015 [seen by Ramon DredgeEdward, PA]. Last OV was 07/28/16; patient cancelled appts for 03/13/17 & 04/03/17; forwarded to provider with copies & notes for advisement/SLS 03/04      Patient did come in for a 05/04/17 appointment but according to provider assistant, paperwork had already been disposed of. Patient is needing FMLA for her Hypertension. Instructed her to call [employer] and explain and request they fax over new paperwork to us; I will complete as much as possible and then forward to provider, pt understood & agreed/SLS 06/04  July 24, 2017  WedoweeMellen, Darcus AustinEmily R, RN   8:14 AM  Note    Patient speaking with Jasmine DecemberSharon, [place] who handles paperwork, to notify pt. to resend FMLA paperwork due to provider preference in seeing pt. for office visit before signing required forms. Dr. Leta JunglingPaz's MA, [facility] Hospital[facility], aware.   Copied from CRM [id number]. Topic: Inquiry >> Jul 24, 2017  8:03 AM Yvonna Alanisobinson, Andra M wrote: Reason for CRM: Patient states that she needs her FMLA paperwork updated. [employer] has denied patient's FMLA, because they state they do not have  the proper documentation to support. Patient needs a call back this morning from someone who works with FMLA within our office at [phone].       Thank You!!!

## 2017-07-24 NOTE — Telephone Encounter (Signed)
Patient speaking with Jasmine DecemberSharon, [place] who handles paperwork, to notify pt. to resend FMLA paperwork due to provider preference in seeing pt. for office visit before signing required forms. Dr. Leta JunglingPaz's MA, [facility]lyn, aware.   Copied from CRM [id number]. Topic: Inquiry >> Jul 24, 2017  8:03 AM Sara Norris, Sara Norris wrote: Reason for CRM: Patient states that she needs her FMLA paperwork updated. [employer] has denied patient's FMLA, because they state they do not have the proper documentation to support. Patient needs a call back this morning from someone who works with FMLA within our office at [phone].       Thank You!!!

## 2017-08-01 NOTE — Telephone Encounter (Signed)
Patient called today to let office know that [employer] has not yet received paperwork and that she did have a visit with Dr. Drue NovelPaz. She would like to talk to his CMA about this, if she can please call her back.

## 2017-08-03 NOTE — Telephone Encounter (Signed)
Once again, I will need new paperwork sent to office as the original was shredded by provider assistant before pt made OV appointment; spoke with pt and informed that we have not received the paperwork from [employer], she will inform them again to send the paperwork/SLS 06/14  Me    07/24/17 8:20 AM  Note    Spoke with patient about her March 2019 paperwork from [employer]: Note                                             04/23/17 9:27 AM   Received FMLA/STD paperwork from[employer],cannotcompletedue to No Known dates of patient being out of work, last FMLA was for Diverticulitis for dates in 10/2015 [seen by Ramon DredgeEdward, PA]. Last OV was 07/28/16; patient cancelled appts for 03/13/17 & 04/03/17; forwarded to providerwith copies & notes for advisement/SLS03/04       Patient did come in for a 05/04/17 appointment but according to provider assistant, paperwork had already been disposed of. Patient is needing FMLA for her Hypertension. Instructed her to call [employer] and explain and request they fax over new paperwork to us; I will complete as much as possible and then forward to provider, pt understood & agreed/SLS 06/04

## 2017-08-09 NOTE — Telephone Encounter (Signed)
Provider's half day; will forward to provider for completion upon RTO on Fri, 08/10/17//SLS

## 2017-08-10 NOTE — Telephone Encounter (Signed)
Provider out of office today, will RTO on Monday, 08/13/17 SLS 06/21

## 2017-08-13 NOTE — Telephone Encounter (Signed)
Paperwork completed a smuch as possible; forwarded to provider/SLS 06/24

## 2017-08-27 MED FILL — ZOLPIDEM TARTRATE 10 MG TAB: 10 | 30 days supply | Qty: 30 | Fill #3

## 2017-08-28 NOTE — Telephone Encounter (Signed)
Completed via fax/SLS  

## 2017-08-29 ENCOUNTER — Telehealth: Payer: Self-pay | Admitting: Internal Medicine

## 2017-08-29 NOTE — Telephone Encounter (Signed)
Please advise 

## 2017-08-29 NOTE — Telephone Encounter (Signed)
Please clarify: I do not see any telephone encounter for 08/29/2017 other than this one. Also , prednisone has no role on diverticulitis treatment.  Additionally, I do not see that she has been diagnosed with diverticulitis.

## 2017-08-29 NOTE — Telephone Encounter (Signed)
Copied from CRM [phone]. Topic: Quick Communication - See Telephone Encounter >> Aug 29, 2017  9:50 AM Arlyss Gandyichardson, Payeton Germani N, NT wrote: CRM for notification. See Telephone encounter for: 08/29/17. Pt is wanting to know if prednisone will help with diverticulitis for pain? If so she would like to see if this can be prescribed.

## 2017-08-29 NOTE — Telephone Encounter (Signed)
Spoke w/ Pt- informed of recommendations. Pt verbalized understanding.  

## 2017-09-03 ENCOUNTER — Encounter (HOSPITAL_COMMUNITY): Payer: Self-pay | Admitting: *Deleted

## 2017-09-03 ENCOUNTER — Emergency Department (HOSPITAL_COMMUNITY): Payer: 59

## 2017-09-03 ENCOUNTER — Other Ambulatory Visit: Payer: Self-pay

## 2017-09-03 ENCOUNTER — Observation Stay (HOSPITAL_COMMUNITY)
Admission: EM | Admit: 2017-09-03 | Discharge: 2017-09-04 | Disposition: A | Discharge status: Home / Self Care | Payer: 59 | Attending: Internal Medicine | Admitting: Internal Medicine

## 2017-09-03 DIAGNOSIS — I160 Hypertensive urgency: Secondary | ICD-10-CM

## 2017-09-03 DIAGNOSIS — I10 Essential (primary) hypertension: Secondary | ICD-10-CM | POA: Diagnosis present

## 2017-09-03 DIAGNOSIS — Z79899 Other long term (current) drug therapy: Secondary | ICD-10-CM | POA: Diagnosis not present

## 2017-09-03 DIAGNOSIS — Z9049 Acquired absence of other specified parts of digestive tract: Secondary | ICD-10-CM | POA: Diagnosis not present

## 2017-09-03 DIAGNOSIS — J302 Other seasonal allergic rhinitis: Secondary | ICD-10-CM | POA: Diagnosis not present

## 2017-09-03 DIAGNOSIS — G8929 Other chronic pain: Secondary | ICD-10-CM | POA: Insufficient documentation

## 2017-09-03 DIAGNOSIS — E876 Hypokalemia: Secondary | ICD-10-CM | POA: Diagnosis not present

## 2017-09-03 DIAGNOSIS — R519 Headache, unspecified: Secondary | ICD-10-CM

## 2017-09-03 DIAGNOSIS — F410 Panic disorder [episodic paroxysmal anxiety] without agoraphobia: Secondary | ICD-10-CM | POA: Insufficient documentation

## 2017-09-03 DIAGNOSIS — G4700 Insomnia, unspecified: Secondary | ICD-10-CM | POA: Diagnosis not present

## 2017-09-03 DIAGNOSIS — Z885 Allergy status to narcotic agent status: Secondary | ICD-10-CM | POA: Diagnosis not present

## 2017-09-03 DIAGNOSIS — I169 Hypertensive crisis, unspecified: Secondary | ICD-10-CM | POA: Diagnosis not present

## 2017-09-03 DIAGNOSIS — Z7982 Long term (current) use of aspirin: Secondary | ICD-10-CM | POA: Diagnosis not present

## 2017-09-03 DIAGNOSIS — R51 Headache: Secondary | ICD-10-CM | POA: Insufficient documentation

## 2017-09-03 DIAGNOSIS — R55 Syncope and collapse: Secondary | ICD-10-CM | POA: Diagnosis not present

## 2017-09-03 DIAGNOSIS — R42 Dizziness and giddiness: Secondary | ICD-10-CM | POA: Diagnosis not present

## 2017-09-03 DIAGNOSIS — Z8249 Family history of ischemic heart disease and other diseases of the circulatory system: Secondary | ICD-10-CM | POA: Insufficient documentation

## 2017-09-03 LAB — URINALYSIS, ROUTINE W REFLEX MICROSCOPIC
BILIRUBIN URINE: NEGATIVE
Glucose, UA: NEGATIVE mg/dL
KETONES UR: NEGATIVE mg/dL
LEUKOCYTES UA: NEGATIVE
NITRITE: NEGATIVE
Protein, ur: 100 mg/dL — AB
SPECIFIC GRAVITY, URINE: 1.012 (ref 1.005–1.030)
pH: 6 (ref 5.0–8.0)

## 2017-09-03 LAB — CBC
HEMATOCRIT: 39.8 % (ref 36.0–46.0)
HEMOGLOBIN: 13.3 g/dL (ref 12.0–15.0)
MCH: 30.4 pg (ref 26.0–34.0)
MCHC: 33.4 g/dL (ref 30.0–36.0)
MCV: 90.9 fL (ref 78.0–100.0)
Platelets: 474 10*3/uL — ABNORMAL HIGH (ref 150–400)
RBC: 4.38 MIL/uL (ref 3.87–5.11)
RDW: 12.8 % (ref 11.5–15.5)
WBC: 5.1 10*3/uL (ref 4.0–10.5)

## 2017-09-03 LAB — I-STAT BETA HCG BLOOD, ED (MC, WL, AP ONLY): HCG, QUANTITATIVE: 5.6 m[IU]/mL — AB (ref <5)

## 2017-09-03 LAB — BASIC METABOLIC PANEL
ANION GAP: 11 (ref 5–15)
BUN: 22 mg/dL — AB (ref 6–20)
CO2: 28 mmol/L (ref 22–32)
Calcium: 9.6 mg/dL (ref 8.9–10.3)
Chloride: 101 mmol/L (ref 98–111)
Creatinine, Ser: 0.9 mg/dL (ref 0.44–1.00)
GFR calc non Af Amer: >60 mL/min (ref >60)
Glucose, Bld: 99 mg/dL (ref 70–99)
POTASSIUM: 3.4 mmol/L — AB (ref 3.5–5.1)
SODIUM: 140 mmol/L (ref 135–145)

## 2017-09-03 LAB — CBG MONITORING, ED: Glucose-Capillary: 106 mg/dL — ABNORMAL HIGH (ref 70–99)

## 2017-09-03 LAB — TROPONIN I: Troponin I: <0.03 ng/mL (ref <0.03)

## 2017-09-03 MED ORDER — EPINEPHRINE 0.3 MG/0.3ML IJ SOAJ: 0.3000 mg | Freq: Once | INTRAMUSCULAR | Status: DC

## 2017-09-03 MED ORDER — SODIUM CHLORIDE 0.9% FLUSH
3.0000 mL | Freq: Two times a day (BID) | INTRAVENOUS | Status: DC
  Administered 2017-09-03: 3 mL via INTRAVENOUS

## 2017-09-03 MED ORDER — ACETAMINOPHEN 650 MG RE SUPP: 650.0000 mg | Freq: Four times a day (QID) | RECTAL | Status: DC | PRN

## 2017-09-03 MED ORDER — ONDANSETRON HCL 4 MG PO TABS: 4.0000 mg | ORAL_TABLET | Freq: Four times a day (QID) | ORAL | Status: DC | PRN

## 2017-09-03 MED ORDER — LOSARTAN POTASSIUM 50 MG PO TABS
100.0000 mg | ORAL_TABLET | Freq: Every day | ORAL | Status: DC
  Administered 2017-09-03 – 2017-09-04 (×2): 100 mg via ORAL
  Filled 2017-09-03 (×2): qty 2

## 2017-09-03 MED ORDER — LORAZEPAM 2 MG/ML IJ SOLN
1.0000 mg | Freq: Once | INTRAMUSCULAR | Status: AC
Start: 2017-09-03 — End: 2017-09-03
  Administered 2017-09-03: 1 mg via INTRAVENOUS
  Filled 2017-09-03: qty 1

## 2017-09-03 MED ORDER — SODIUM CHLORIDE 0.9% FLUSH: 3.0000 mL | INTRAVENOUS | Status: DC | PRN

## 2017-09-03 MED ORDER — ACETAMINOPHEN 325 MG PO TABS: 650.0000 mg | ORAL_TABLET | Freq: Four times a day (QID) | ORAL | Status: DC | PRN

## 2017-09-03 MED ORDER — CARVEDILOL 6.25 MG PO TABS
6.2500 mg | ORAL_TABLET | Freq: Two times a day (BID) | ORAL | Status: DC
  Administered 2017-09-04: 6.25 mg via ORAL
  Filled 2017-09-03 (×2): qty 1

## 2017-09-03 MED ORDER — ASPIRIN EC 81 MG PO TBEC
81.0000 mg | DELAYED_RELEASE_TABLET | Freq: Every day | ORAL | Status: DC
  Administered 2017-09-03 – 2017-09-04 (×2): 81 mg via ORAL
  Filled 2017-09-03 (×2): qty 1

## 2017-09-03 MED ORDER — ENOXAPARIN SODIUM 40 MG/0.4ML ~~LOC~~ SOLN
40.0000 mg | SUBCUTANEOUS | Status: DC
  Administered 2017-09-03: 40 mg via SUBCUTANEOUS
  Filled 2017-09-03: qty 0.4

## 2017-09-03 MED ORDER — HYDRALAZINE HCL 20 MG/ML IJ SOLN
10.0000 mg | INTRAMUSCULAR | Status: DC | PRN
  Administered 2017-09-03 (×2): 10 mg via INTRAVENOUS
  Filled 2017-09-03 (×2): qty 1

## 2017-09-03 MED ORDER — LABETALOL HCL 5 MG/ML IV SOLN
10.0000 mg | Freq: Once | INTRAVENOUS | Status: AC
  Administered 2017-09-03: 10 mg via INTRAVENOUS
  Filled 2017-09-03: qty 4

## 2017-09-03 MED ORDER — HYDROCHLOROTHIAZIDE 12.5 MG PO CAPS
12.5000 mg | ORAL_CAPSULE | Freq: Every day | ORAL | Status: DC
  Administered 2017-09-03 – 2017-09-04 (×2): 12.5 mg via ORAL
  Filled 2017-09-03 (×2): qty 1

## 2017-09-03 MED ORDER — POTASSIUM CHLORIDE ER 10 MEQ PO TBCR
10.0000 meq | EXTENDED_RELEASE_TABLET | Freq: Every day | ORAL | Status: DC
Start: 2017-09-03 — End: 2017-09-04
  Administered 2017-09-03 – 2017-09-04 (×2): 10 meq via ORAL
  Filled 2017-09-03 (×4): qty 1

## 2017-09-03 MED ORDER — ONDANSETRON HCL 4 MG/2ML IJ SOLN: 4.0000 mg | Freq: Four times a day (QID) | INTRAMUSCULAR | Status: DC | PRN

## 2017-09-03 MED ORDER — CHOLECALCIFEROL 10 MCG (400 UNIT) PO TABS
400.0000 [IU] | ORAL_TABLET | Freq: Every day | ORAL | Status: DC
  Administered 2017-09-03 – 2017-09-04 (×2): 400 [IU] via ORAL
  Filled 2017-09-03 (×2): qty 1

## 2017-09-03 MED ORDER — ZOLPIDEM TARTRATE 10 MG PO TABS: 5.0000 mg | ORAL_TABLET | Freq: Every evening | ORAL | Status: DC | PRN

## 2017-09-03 MED ORDER — VITAMIN D3 10 MCG (400 UNIT) PO TABS
400.0000 [IU] | ORAL_TABLET | Freq: Every day | ORAL | Status: DC
  Filled 2017-09-03: qty 1

## 2017-09-03 MED ORDER — SODIUM CHLORIDE 0.9 % IV SOLN
250.0000 mL | INTRAVENOUS | Status: DC | PRN
  Administered 2017-09-03: 250 mL via INTRAVENOUS

## 2017-09-03 MED ORDER — LOSARTAN POTASSIUM-HCTZ 100-12.5 MG PO TABS: 1.0000 | ORAL_TABLET | Freq: Every day | ORAL | Status: DC

## 2017-09-03 MED ORDER — BUTALBITAL-APAP-CAFFEINE 50-325-40 MG PO TABS
1.0000 | ORAL_TABLET | ORAL | Status: DC | PRN
  Administered 2017-09-03 – 2017-09-04 (×3): 1 via ORAL
  Filled 2017-09-03 (×3): qty 1

## 2017-09-03 NOTE — H&P (Signed)
[facility] History and Physical  Sara BondsLorrie A Rape YNW:[id number] DOB: 1965/02/24 DOA: 09/03/2017  PCP: Wanda PlumpPaz, Jose E, MD  Patient coming from: Home  Chief Complaint: Headache, high blood pressure  HPI: Sara Norris is a 52 y.o. female with a medical history of essential hypertension, anxiety, who presented to the emergency department with complaints of high blood pressure and dizziness.  Patient states that she started to have a headache earlier today and got lightheaded.  She states that this was very reminiscent of when a CODE BLUE had to be called on her several years ago.  Patient began to panic.  She felt as if she would pass out but did not.  He states her headache is approximately a 7 out of 10 and throbbing in nature, and in the front of her head.  Patient states she has been very compliant with her Coreg.  She states clonidine seemed to work better for her in the past but does not know why this was discontinued.  She does admit to having a salty meal yesterday and this morning.  She denies current chest pain, shortness of breath, abdominal pain, nausea or vomiting, diarrhea or constipation, dysuria, recent travel or illness.  ED Course: Found to have hypertensive urgency with BP 226/128.  Patient was given 3 doses of labetalol with minor improvement in blood pressure.  TRH called for admission.  Review of Systems:  All other systems reviewed and are negative.   Past Medical History:  Diagnosis Date  . Allergy    seasonal  . Chronic knee pain   . Colonic diverticular abscess   . Contraception    menopausal  . Diverticulosis   . Hiatal hernia    per pt, not aware of this.  . Hypertension    dx age 52  . Menopause    LMP age 89  . Umbilical hernia    per pt/ nopt aware of this.    Past Surgical History:  Procedure Laterality Date  . CHOLECYSTECTOMY    . MYOMECTOMY     removed fibroids/ when she was in her 30"s  . TONSILLECTOMY      Social History:  reports that  she has never smoked. She has never used smokeless tobacco. She reports that she drinks alcohol. She reports that she does not use drugs.   Allergies  Allergen Reactions  . Soy Allergy     Itching of throat.  . Citrus Rash  . Codeine Rash  . Pollen Extract     sneezing    Family History  Problem Relation Age of Onset  . Hypertension Mother   . Diabetes Mother   . Arthritis Mother   . Gout Mother   . Hypertension Father   . Diabetes Father        GP  . Hypertension Unknown        GP  . CAD Maternal Grandmother   . Stroke Neg Hx   . Colon cancer Neg Hx   . Breast cancer Neg Hx      Prior to Admission medications   Medication Sig Start Date End Date Taking? Authorizing Provider  aspirin EC 81 MG tablet Take 81 mg by mouth daily.   Yes [provider]  carvedilol (COREG) 6.25 MG tablet Take 1 tablet (6.25 mg total) by mouth 2 (two) times daily with a meal. 07/28/16  Yes Paz, Nolon RodJose E, MD  Cholecalciferol (VITAMIN D3) 400 units tablet Take 400 Units by mouth daily.  Yes [provider]  EPINEPHrine 0.3 mg/0.3 mL IJ SOAJ injection Inject 0.3 mg into the muscle once.   Yes [provider]  potassium chloride (K-DUR) 10 MEQ tablet Take 1 tablet (10 mEq total) by mouth daily. Patient taking differently: Take 10 mEq by mouth as needed.  07/28/16  Yes Paz, Nolon Rod, MD  zolpidem (AMBIEN) 10 MG tablet Take 1 tablet (10 mg total) by mouth at bedtime as needed for sleep. 05/21/17  Yes Paz, Nolon Rod, MD  losartan-hydrochlorothiazide (HYZAAR) 100-12.5 MG tablet Take 1 tablet by mouth daily. Patient not taking: Reported on 06/07/2017 07/28/16   Wanda Plump, MD    Physical Exam: Vitals:   09/03/17 1549 09/03/17 1550  BP: (!) 201/111 (!) 197/112  Pulse:  74  Resp:  20  Temp:    SpO2:  98%     General: Well developed, well nourished, NAD, appears stated age  HEENT: NCAT, PERRLA, EOMI, Anicteic Sclera, mucous membranes moist.   Neck: Supple, no JVD, no  masses  Cardiovascular: S1 S2 auscultated, no rubs, murmurs or gallops. Regular rate and rhythm.  Respiratory: Clear to auscultation bilaterally with equal chest rise  Abdomen: Soft, nontender, nondistended, + bowel sounds  Extremities: warm dry without cyanosis clubbing or edema  Neuro: AAOx3, cranial nerves grossly intact. Strength 5/5 in patient's upper and lower extremities bilaterally  Skin: Without rashes exudates or nodules  Psych: Normal affect and demeanor with intact judgement and insight  Labs on Admission: I have personally reviewed following labs and imaging studies CBC: Recent Labs  Lab 09/03/17 1110  WBC 5.1  HGB 13.3  HCT 39.8  MCV 90.9  PLT 474*   Basic Metabolic Panel: Recent Labs  Lab 09/03/17 1110  NA 140  K 3.4*  CL 101  CO2 28  GLUCOSE 99  BUN 22*  CREATININE 0.90  CALCIUM 9.6   GFR: Estimated Creatinine Clearance: 86.7 mL/min (by C-G formula based on SCr of 0.9 mg/dL). Liver Function Tests: No results for input(s): AST, ALT, ALKPHOS, BILITOT, PROT, ALBUMIN in the last 168 hours. No results for input(s): LIPASE, AMYLASE in the last 168 hours. No results for input(s): AMMONIA in the last 168 hours. Coagulation Profile: No results for input(s): INR, PROTIME in the last 168 hours. Cardiac Enzymes: Recent Labs  Lab 09/03/17 1110  TROPONINI <0.03   BNP (last 3 results) No results for input(s): PROBNP in the last 8760 hours. HbA1C: No results for input(s): HGBA1C in the last 72 hours. CBG: Recent Labs  Lab 09/03/17 1100  GLUCAP 106*   Lipid Profile: No results for input(s): CHOL, HDL, LDLCALC, TRIG, CHOLHDL, LDLDIRECT in the last 72 hours. Thyroid Function Tests: No results for input(s): TSH, T4TOTAL, FREET4, T3FREE, THYROIDAB in the last 72 hours. Anemia Panel: No results for input(s): VITAMINB12, FOLATE, FERRITIN, TIBC, IRON, RETICCTPCT in the last 72 hours. Urine analysis:    Component Value Date/Time   COLORURINE YELLOW  09/03/2017 1236   APPEARANCEUR CLEAR 09/03/2017 1236   LABSPEC 1.012 09/03/2017 1236   PHURINE 6.0 09/03/2017 1236   GLUCOSEU NEGATIVE 09/03/2017 1236   GLUCOSEU NEGATIVE 06/25/2015 1159   HGBUR SMALL (A) 09/03/2017 1236   BILIRUBINUR NEGATIVE 09/03/2017 1236   BILIRUBINUR 1+ 06/25/2015 1159   KETONESUR NEGATIVE 09/03/2017 1236   PROTEINUR 100 (A) 09/03/2017 1236   UROBILINOGEN 0.2 06/25/2015 1159   UROBILINOGEN 1.0 06/25/2015 1159   NITRITE NEGATIVE 09/03/2017 1236   LEUKOCYTESUR NEGATIVE 09/03/2017 1236   Sepsis Labs: @LABRCNTIP (procalcitonin:4,lacticidven:4) )No results  found for this or any previous visit (from the past 240 hour(s)).   Radiological Exams on Admission: Ct Head Wo Contrast  Result Date: 09/03/2017 CLINICAL DATA:  Near syncope.  Hypertension. EXAM: CT HEAD WITHOUT CONTRAST TECHNIQUE: Contiguous axial images were obtained from the base of the skull through the vertex without intravenous contrast. COMPARISON:  MRI and CT 01/10/2008 FINDINGS: Brain: No acute intracranial abnormality. Specifically, no hemorrhage, hydrocephalus, mass lesion, acute infarction, or significant intracranial injury. Vascular: No hyperdense vessel or unexpected calcification. Skull: No acute calvarial abnormality. Sinuses/Orbits: Visualized paranasal sinuses and mastoids clear. Orbital soft tissues unremarkable. Other: None IMPRESSION: No acute intracranial abnormality. Electronically Signed   By: Charlett Nose M.D.   On: 09/03/2017 12:25    EKG: Independently reviewed.  Sinus rhythm, rate 87, LVH  Assessment/Plan  Hypertensive urgency/crisis -With a BP of 226/128 along with headache as well as dizziness and anxiety, was given several doses of labetalol -BP currently 197/112 -CT head unremarkable -Will continue patient's Coreg, placed on hydralazine -She has taken lisinopril in the past however states they made her sick.  She is also been on clonidine in the past states she was doing well,  does not know why PCP discontinued this medication. -Per review of chart, patient is supposed to be on Hyzaar- not sure why or when this was discontinued. Discussed with Dr. Drue Novel, she should was to continue Hyzaar. Patient was intolerant to amlodipine and per documentation and discussion with Dr. Drue Novel, clonidine made her sleepy- she was only taking it at night. -Will restart Hyzaar  Headache -Possibly secondary to uncontrolled hypertension -Will add on Fioricet for pain control  Hypokalemia -Please continue to monitor BMP  Insomnia -Continue Ambien nightly  DVT prophylaxis: Lovenox  Code Status: Full  Family Communication: None at bedside. Admission, patients condition and plan of care including tests being ordered have been discussed with the patient, who indicates understanding and agrees with the plan and Code Status.  Disposition Plan: Home   Consults called: None   Admission status: Observation   Time spent: 70 minutes  Edwards Mckelvie D.O. [facility] Pager [phone]  If 7PM-7AM, please contact night-coverage [email or web address] Password [email or web address] 09/03/2017, 4:00 PM

## 2017-09-03 NOTE — ED Notes (Signed)
Patient transported to CT 

## 2017-09-03 NOTE — ED Provider Notes (Signed)
[facility]-EMERGENCY DEPT Provider Note   CSN: [id number] Arrival date & time: 09/03/17  1053     History   Chief Complaint Chief Complaint  Patient presents with  . Near Syncope  . Hypertension    HPI Sara Norris is a 52 y.o. female.  52 year old female with history of hypertension presents with 3 days of left occipital headache that radiates down to her left trapezius muscle with associated near syncope today at work.  Patient works in housekeeping here and felt like she was going to pass out.  States that she has had issues with her blood pressure today and did take her usual blood pressure medication.  States that her systolic was in the 200s and diastolic in 120s.  While at work today it became worse but was not associated with visual changes, emesis, focal weakness.  She became very anxious about this and states that she felt she was having a panic attack.  At that point she like she is in a pass out.  She felt better when she was able to relax herself.  Was transported here for further treatment     Past Medical History:  Diagnosis Date  . Allergy    seasonal  . Chronic knee pain   . Colonic diverticular abscess   . Contraception    menopausal  . Diverticulosis   . Hiatal hernia    per pt, not aware of this.  . Hypertension    dx age 53  . Menopause    LMP age 31  . Umbilical hernia    per pt/ nopt aware of this.    Patient Active Problem List   Diagnosis Date Noted  . Acute diverticulitis, h/o 03/08/2015  . PCP NOTES >>>>> 01/16/2015  . Insomnia 06/17/2013  . General medical examination 05/24/2010  . Essential hypertension 04/09/2009    Past Surgical History:  Procedure Laterality Date  . CHOLECYSTECTOMY    . MYOMECTOMY     removed fibroids/ when she was in her 30"s  . TONSILLECTOMY       OB History    Gravida  3   Para  1   Term      Preterm      AB  2   Living  1     SAB      TAB      Ectopic      Multiple      Live Births               Home Medications    Prior to Admission medications   Medication Sig Start Date End Date Taking? Authorizing Provider  aspirin EC 81 MG tablet Take 81 mg by mouth daily.    [provider]  carvedilol (COREG) 6.25 MG tablet Take 1 tablet (6.25 mg total) by mouth 2 (two) times daily with a meal. 07/28/16   Wanda Plump, MD  Cholecalciferol (VITAMIN D3) 400 units tablet Take 400 Units by mouth daily.    [provider]  EPINEPHrine 0.3 mg/0.3 mL IJ SOAJ injection Inject 0.3 mg into the muscle once.    [provider]  losartan-hydrochlorothiazide (HYZAAR) 100-12.5 MG tablet Take 1 tablet by mouth daily. Patient not taking: Reported on 06/07/2017 07/28/16   Wanda Plump, MD  potassium chloride (K-DUR) 10 MEQ tablet Take 1 tablet (10 mEq total) by mouth daily. Patient taking differently: Take 10 mEq by mouth as needed.  07/28/16   Willow Ora  E, MD  zolpidem (AMBIEN) 10 MG tablet Take 1 tablet (10 mg total) by mouth at bedtime as needed for sleep. 05/21/17   Wanda PlumpPaz, Jose E, MD    Family History Family History  Problem Relation Age of Onset  . Hypertension Mother   . Diabetes Mother   . Arthritis Mother   . Gout Mother   . Hypertension Father   . Diabetes Father        GP  . Hypertension Unknown        GP  . CAD Maternal Grandmother   . Stroke Neg Hx   . Colon cancer Neg Hx   . Breast cancer Neg Hx     Social History Social History   Tobacco Use  . Smoking status: Never Smoker  . Smokeless tobacco: Never Used  Substance Use Topics  . Alcohol use: Yes    Comment: SOCIAL   . Drug use: No    Comment: no marihuana ;lately      Allergies   Soy allergy; Citrus; Codeine; and Pollen extract   Review of Systems Review of Systems  All other systems reviewed and are negative.    Physical Exam Updated Vital Signs BP (!) 226/126 (BP Location: Right Arm)   Pulse 82   Temp 98.5 F (36.9 C) (Oral)   Resp 18   Ht  1.702 m (5\' 7" )   Wt 93.4 kg (206 lb)   SpO2 100%   BMI 32.26 kg/m   Physical Exam  Constitutional: She is oriented to person, place, and time. She appears well-developed and well-nourished.  Non-toxic appearance. No distress.  HENT:  Head: Normocephalic and atraumatic.  Eyes: Pupils are equal, round, and reactive to light. Conjunctivae, EOM and lids are normal.  Neck: Normal range of motion. Neck supple. No tracheal deviation present. No thyroid mass present.  Cardiovascular: Normal rate, regular rhythm and normal heart sounds. Exam reveals no gallop.  No murmur heard. Pulmonary/Chest: Effort normal and breath sounds normal. No stridor. No respiratory distress. She has no decreased breath sounds. She has no wheezes. She has no rhonchi. She has no rales.  Abdominal: Soft. Normal appearance and bowel sounds are normal. She exhibits no distension. There is no tenderness. There is no rebound and no CVA tenderness.  Musculoskeletal: Normal range of motion. She exhibits no edema or tenderness.  Neurological: She is alert and oriented to person, place, and time. She has normal strength. She displays no tremor. No cranial nerve deficit or sensory deficit. She displays a negative Romberg sign. GCS eye subscore is 4. GCS verbal subscore is 5. GCS motor subscore is 6.  No pronator drift.  Speech normal.  No facial asymmetry  Skin: Skin is warm and dry. No abrasion and no rash noted.  Psychiatric: She has a normal mood and affect. Her speech is normal and behavior is normal.  Nursing note and vitals reviewed.    ED Treatments / Results  Labs (all labs ordered are listed, but only abnormal results are displayed) Labs Reviewed  CBC - Abnormal; Notable for the following components:      Result Value   Platelets 474 (*)    All other components within normal limits  CBG MONITORING, ED - Abnormal; Notable for the following components:   Glucose-Capillary 106 (*)    All other components within  normal limits  I-STAT BETA HCG BLOOD, ED ([facility], [facility], [facility] ONLY) - Abnormal; Notable for the following components:   I-stat hCG, quantitative 5.6 (*)  All other components within normal limits  BASIC METABOLIC PANEL  URINALYSIS, ROUTINE W REFLEX MICROSCOPIC  CBG MONITORING, ED    EKG EKG Interpretation  Date/Time:  Monday September 03 2017 10:57:59 EDT Ventricular Rate:  87 PR Interval:    QRS Duration: 83 QT Interval:  405 QTC Calculation: 488 R Axis:   25 Text Interpretation:  Sinus rhythm Borderline prolonged PR interval Left atrial enlargement Left ventricular hypertrophy Borderline T abnormalities, lateral leads Borderline prolonged QT interval Baseline wander in lead(s) V2 Confirmed by Lorre Nick ([id number]) on 09/03/2017 11:24:15 AM   Radiology No results found.  Procedures Procedures (including critical care time)  Medications Ordered in ED Medications  labetalol (NORMODYNE,TRANDATE) injection 10 mg (has no administration in time range)  LORazepam (ATIVAN) injection 1 mg (has no administration in time range)     Initial Impression / Assessment and Plan / ED Course  I have reviewed the triage vital signs and the nursing notes.  Pertinent labs & imaging results that were available during my care of the patient were reviewed by me and considered in my medical decision making (see chart for details).     Patient given labetalol x3  10 mg IV push with only slight improvement of her blood pressure.  Also given Ativan because she appears somewhat anxious.  Patient's urinalysis shows increased protein.  Renal function however is normal at this point.  Blood pressure remains with a systolic above 200 and diastolic above 100.  Head CT was negative.  Will consult hospitalist for admission for blood pressure management  CRITICAL CARE Performed by: Toy Baker Total critical care time: 60 minutes Critical care time was exclusive of separately billable procedures and treating other  patients. Critical care was necessary to treat or prevent imminent or life-threatening deterioration. Critical care was time spent personally by me on the following activities: development of treatment plan with patient and/or surrogate as well as nursing, discussions with consultants, evaluation of patient's response to treatment, examination of patient, obtaining history from patient or surrogate, ordering and performing treatments and interventions, ordering and review of laboratory studies, ordering and review of radiographic studies, pulse oximetry and re-evaluation of patient's condition.   Final Clinical Impressions(s) / ED Diagnoses   Final diagnoses:  None    ED Discharge Orders    None       Lorre Nick, MD 09/03/17 1452

## 2017-09-03 NOTE — ED Triage Notes (Signed)
Pt was at work and had near syncopal episode. Pt has HTN and has tried to call PMD to increase meds. 231/134. Pt states she feels like it started as an anxiety attack.

## 2017-09-03 NOTE — ED Notes (Signed)
Bed: WA13 Expected date:  Expected time:  Means of arrival:  Comments: 

## 2017-09-03 NOTE — ED Notes (Signed)
ED TO INPATIENT HANDOFF REPORT  Name/Age/Gender Sara Norris 52 y.o. female  Code Status Code Status History    Date Active Date Inactive Code Status Order ID Comments User Context   03/08/2015 1536 03/11/2015 1357 Full Code [id number]  Bonnielee Haff, MD Inpatient      Home/SNF/Other Home  Chief Complaint syncope   Level of Care/Admitting Diagnosis ED Disposition    ED Disposition Condition [facility] Area: [facility] [[id number]]  Level of Care: Telemetry [5]  Admit to tele based on following criteria: Other see comments  Comments: Hypertensive crisis  Diagnosis: Hypertensive crisis [825003]  Admitting Physician: Cristal Ford [[id number]]  Attending Physician: Cristal Ford [phone]  PT Class (Do Not Modify): Observation [104]  PT Acc Code (Do Not Modify): Observation [10022]       Medical History Past Medical History:  Diagnosis Date  . Allergy    seasonal  . Chronic knee pain   . Colonic diverticular abscess   . Contraception    menopausal  . Diverticulosis   . Hiatal hernia    per pt, not aware of this.  . Hypertension    dx age 44  . Menopause    LMP age 11  . Umbilical hernia    per pt/ nopt aware of this.    Allergies Allergies  Allergen Reactions  . Soy Allergy     Itching of throat.  . Citrus Rash  . Codeine Rash  . Pollen Extract     sneezing    IV Location/Drains/Wounds Patient Lines/Drains/Airways Status   Active Line/Drains/Airways    Name:   Placement date:   Placement time:   Site:   Days:   Peripheral IV 09/03/17 Left Antecubital   09/03/17    1110    Antecubital   less than 1   Peripheral IV 09/03/17 Left;Distal Forearm   09/03/17    1157    Forearm   less than 1          Labs/Imaging Results for orders placed or performed during the hospital encounter of 09/03/17 (from the past 48 hour(s))  CBG monitoring, ED     Status: Abnormal   Collection Time: 09/03/17 11:00 AM  Result Value  Ref Range   Glucose-Capillary 106 (H) 70 - 99 mg/dL  Basic metabolic panel     Status: Abnormal   Collection Time: 09/03/17 11:10 AM  Result Value Ref Range   Sodium 140 135 - 145 mmol/L   Potassium 3.4 (L) 3.5 - 5.1 mmol/L   Chloride 101 98 - 111 mmol/L    Comment: Please note change in reference range.   CO2 28 22 - 32 mmol/L   Glucose, Bld 99 70 - 99 mg/dL    Comment: Please note change in reference range.   BUN 22 (H) 6 - 20 mg/dL    Comment: Please note change in reference range.   Creatinine, Ser 0.90 0.44 - 1.00 mg/dL   Calcium 9.6 8.9 - 10.3 mg/dL   GFR calc non Af Amer >60 >60 mL/min   GFR calc Af Amer >60 >60 mL/min    Comment: (NOTE) The eGFR has been calculated using the CKD EPI equation. This calculation has not been validated in all clinical situations. eGFR's persistently <60 mL/min signify possible Chronic Kidney Disease.    Anion gap 11 5 - 15    Comment: Performed at [facility], [place] [address] [place], [place] [address]  CBC  Status: Abnormal   Collection Time: 09/03/17 11:10 AM  Result Value Ref Range   WBC 5.1 4.0 - 10.5 K/uL   RBC 4.38 3.87 - 5.11 MIL/uL   Hemoglobin 13.3 12.0 - 15.0 g/dL   HCT 39.8 36.0 - 46.0 %   MCV 90.9 78.0 - 100.0 fL   MCH 30.4 26.0 - 34.0 pg   MCHC 33.4 30.0 - 36.0 g/dL   RDW 12.8 11.5 - 15.5 %   Platelets 474 (H) 150 - 400 K/uL    Comment: Performed at [facility], [place] [address] [place], [place] [address]  Troponin I     Status: None   Collection Time: 09/03/17 11:10 AM  Result Value Ref Range   Troponin I <0.03 <0.03 ng/mL    Comment: Performed at [facility], [place] [address], [place], [place] [address]  I-Stat beta hCG blood, ED     Status: Abnormal   Collection Time: 09/03/17 11:15 AM  Result Value Ref Range   I-stat hCG, quantitative 5.6 (H) <5 mIU/mL   Comment 3            Comment:   GEST. AGE      CONC.  (mIU/mL)   <=1 WEEK        5 - 50     2 WEEKS        50 - 500     3 WEEKS       100 - 10,000     4 WEEKS     1,000 - 30,000        FEMALE AND NON-PREGNANT FEMALE:     LESS THAN 5 mIU/mL   Urinalysis, Routine w reflex microscopic     Status: Abnormal   Collection Time: 09/03/17 12:36 PM  Result Value Ref Range   Color, Urine YELLOW YELLOW   APPearance CLEAR CLEAR   Specific Gravity, Urine 1.012 1.005 - 1.030   pH 6.0 5.0 - 8.0   Glucose, UA NEGATIVE NEGATIVE mg/dL   Hgb urine dipstick SMALL (A) NEGATIVE   Bilirubin Urine NEGATIVE NEGATIVE   Ketones, ur NEGATIVE NEGATIVE mg/dL   Protein, ur 100 (A) NEGATIVE mg/dL   Nitrite NEGATIVE NEGATIVE   Leukocytes, UA NEGATIVE NEGATIVE   RBC / HPF 0-5 0 - 5 RBC/hpf   WBC, UA 0-5 0 - 5 WBC/hpf   Bacteria, UA RARE (A) NONE SEEN   Squamous Epithelial / LPF 0-5 0 - 5   Mucus PRESENT     Comment: Performed at [facility], [place] [address], [place], [place] [address]   Ct Head Wo Contrast  Result Date: 09/03/2017 CLINICAL DATA:  Near syncope.  Hypertension. EXAM: CT HEAD WITHOUT CONTRAST TECHNIQUE: Contiguous axial images were obtained from the base of the skull through the vertex without intravenous contrast. COMPARISON:  MRI and CT 01/10/2008 FINDINGS: Brain: No acute intracranial abnormality. Specifically, no hemorrhage, hydrocephalus, mass lesion, acute infarction, or significant intracranial injury. Vascular: No hyperdense vessel or unexpected calcification. Skull: No acute calvarial abnormality. Sinuses/Orbits: Visualized paranasal sinuses and mastoids clear. Orbital soft tissues unremarkable. Other: None IMPRESSION: No acute intracranial abnormality. Electronically Signed   By: Rolm Baptise M.D.   On: 09/03/2017 12:25    Pending Labs [employer] (From admission, onward)   Start     Ordered   Signed and Held  HIV antibody (Routine Testing)  Once,   R     Signed and Held   Signed and [employer]  metabolic panel  Tomorrow morning,   R     Signed and Held       Vitals/Pain Today's Vitals   09/03/17 1600 09/03/17 1630 09/03/17 1700 09/03/17 1804  BP: (!) 172/96 (!) 169/96 (!) 165/99 (!) 162/111  Pulse: 81 84 86 85  Resp: '20 19  15  ' Temp:      TempSrc:      SpO2: 100% 99% 99% 99%  Weight:      Height:      PainSc:        Isolation Precautions No active isolations  Medications Medications  0.9 %  sodium chloride infusion (250 mLs Intravenous Transfusing/Transfer 09/03/17 1810)  hydrALAZINE (APRESOLINE) injection 10 mg (10 mg Intravenous Given 09/03/17 1549)  labetalol (NORMODYNE,TRANDATE) injection 10 mg (10 mg Intravenous Given 09/03/17 1158)  LORazepam (ATIVAN) injection 1 mg (1 mg Intravenous Given 09/03/17 1159)  labetalol (NORMODYNE,TRANDATE) injection 10 mg (10 mg Intravenous Given 09/03/17 1333)  labetalol (NORMODYNE,TRANDATE) injection 10 mg (10 mg Intravenous Given 09/03/17 1407)    Mobility walks

## 2017-09-04 DIAGNOSIS — I169 Hypertensive crisis, unspecified: Secondary | ICD-10-CM

## 2017-09-04 DIAGNOSIS — I10 Essential (primary) hypertension: Secondary | ICD-10-CM

## 2017-09-04 DIAGNOSIS — G4700 Insomnia, unspecified: Secondary | ICD-10-CM | POA: Diagnosis not present

## 2017-09-04 DIAGNOSIS — R51 Headache: Secondary | ICD-10-CM | POA: Diagnosis not present

## 2017-09-04 DIAGNOSIS — G8929 Other chronic pain: Secondary | ICD-10-CM | POA: Diagnosis not present

## 2017-09-04 DIAGNOSIS — I160 Hypertensive urgency: Secondary | ICD-10-CM | POA: Diagnosis not present

## 2017-09-04 DIAGNOSIS — Z9049 Acquired absence of other specified parts of digestive tract: Secondary | ICD-10-CM | POA: Diagnosis not present

## 2017-09-04 DIAGNOSIS — R42 Dizziness and giddiness: Secondary | ICD-10-CM | POA: Diagnosis not present

## 2017-09-04 DIAGNOSIS — J302 Other seasonal allergic rhinitis: Secondary | ICD-10-CM | POA: Diagnosis not present

## 2017-09-04 DIAGNOSIS — F410 Panic disorder [episodic paroxysmal anxiety] without agoraphobia: Secondary | ICD-10-CM | POA: Diagnosis not present

## 2017-09-04 DIAGNOSIS — E876 Hypokalemia: Secondary | ICD-10-CM | POA: Diagnosis not present

## 2017-09-04 LAB — BASIC METABOLIC PANEL
Anion gap: 12 (ref 5–15)
BUN: 16 mg/dL (ref 6–20)
CALCIUM: 9.5 mg/dL (ref 8.9–10.3)
CO2: 25 mmol/L (ref 22–32)
CREATININE: 0.89 mg/dL (ref 0.44–1.00)
Chloride: 101 mmol/L (ref 98–111)
Glucose, Bld: 110 mg/dL — ABNORMAL HIGH (ref 70–99)
Potassium: 3.2 mmol/L — ABNORMAL LOW (ref 3.5–5.1)
SODIUM: 138 mmol/L (ref 135–145)

## 2017-09-04 LAB — HIV ANTIBODY (ROUTINE TESTING W REFLEX): HIV SCREEN 4TH GENERATION: NONREACTIVE

## 2017-09-04 MED ORDER — CARVEDILOL 6.25 MG PO TABS: 6.2500 mg | ORAL_TABLET | Freq: Two times a day (BID) | ORAL | 0 refills | Status: DC

## 2017-09-04 MED ORDER — POTASSIUM CHLORIDE ER 10 MEQ PO TBCR: 10.0000 meq | EXTENDED_RELEASE_TABLET | Freq: Every day | ORAL | 0 refills | Status: DC

## 2017-09-04 MED ORDER — POTASSIUM CHLORIDE CRYS ER 20 MEQ PO TBCR
40.0000 meq | EXTENDED_RELEASE_TABLET | Freq: Once | ORAL | Status: AC
  Administered 2017-09-04: 40 meq via ORAL
  Filled 2017-09-04: qty 2

## 2017-09-04 MED ORDER — LOSARTAN POTASSIUM-HCTZ 100-12.5 MG PO TABS: 1.0000 | ORAL_TABLET | Freq: Every day | ORAL | 0 refills | Status: DC

## 2017-09-04 MED ORDER — MAGNESIUM SULFATE 2 GM/50ML IV SOLN
2.0000 g | Freq: Once | INTRAVENOUS | Status: AC
  Administered 2017-09-04: 2 g via INTRAVENOUS
  Filled 2017-09-04: qty 50

## 2017-09-04 MED FILL — CARVEDILOL 6.25 MG TABS: 6.25 | 30 days supply | Qty: 60 | Fill #0

## 2017-09-04 MED FILL — POTASSIUM CL ER 10 MEQ TABL: 10 | 30 days supply | Qty: 30 | Fill #0

## 2017-09-04 MED FILL — LOSARTAN-HCTZ 100-12.5 MG T: 100-12.5 | 30 days supply | Qty: 30 | Fill #0

## 2017-09-04 NOTE — Discharge Instructions (Signed)
Preventing Hypertension °Hypertension, commonly called high blood pressure, is when the force of blood pumping through the arteries is too strong. Arteries are blood vessels that carry blood from the heart throughout the body. Over time, hypertension can damage the arteries and decrease blood flow to important parts of the body, including the brain, heart, and kidneys. Often, hypertension does not cause symptoms until blood pressure is very high. For this reason, it is important to have your blood pressure checked on a regular basis. °Hypertension can often be prevented with diet and lifestyle changes. If you already have hypertension, you can control it with diet and lifestyle changes, as well as medicine. °What nutrition changes can be made? °Maintain a healthy diet. This includes: °· Eating less salt (sodium). Ask your health care provider how much sodium is safe for you to have. The general recommendation is to consume less than 1 tsp (2,300 mg) of sodium a day. °? Do not add salt to your food. °? Choose low-sodium options when grocery shopping and eating out. °· Limiting fats in your diet. You can do this by eating low-fat or fat-free dairy products and by eating less red meat. °· Eating more fruits, vegetables, and whole grains. Make a goal to eat: °? 1½-2 cups of fresh fruits and vegetables each day. °? 3-4 servings of whole grains each day. °· Avoiding foods and beverages that have added sugars. °· Eating fish that contain healthy fats (omega-3 fatty acids), such as mackerel or salmon. ° °If you need help putting together a healthy eating plan, try the DASH diet. This diet is high in fruits, vegetables, and whole grains. It is low in sodium, red meat, and added sugars. DASH stands for Dietary Approaches to Stop Hypertension. °What lifestyle changes can be made? °· Lose weight if you are overweight. Losing just 3?5% of your body weight can help prevent or control hypertension. °? For example, if your present  weight is 200 lb (91 kg), a loss of 3-5% of your weight means losing 6-10 lb (2.7-4.5 kg). °? Ask your health care provider to help you with a diet and exercise plan to safely lose weight. °· Get enough exercise. Do at least 150 minutes of moderate-intensity exercise each week. °? You could do this in short exercise sessions several times a day, or you could do longer exercise sessions a few times a week. For example, you could take a brisk 10-minute walk or bike ride, 3 times a day, for 5 days a week. °· Find ways to reduce stress, such as exercising, meditating, listening to music, or taking a yoga class. If you need help reducing stress, ask your health care provider. °· Do not smoke. This includes e-cigarettes. Chemicals in tobacco and nicotine products raise your blood pressure each time you smoke. If you need help quitting, ask your health care provider. °· Avoid alcohol. If you drink alcohol, limit alcohol intake to no more than 1 drink a day for nonpregnant women and 2 drinks a day for men. One drink equals 12 oz of beer, 5 oz of wine, or 1½ oz of hard liquor. °Why are these changes important? °Diet and lifestyle changes can help you prevent hypertension, and they may make you feel better overall and improve your quality of life. If you have hypertension, making these changes will help you control it and help prevent major complications, such as: °· Hardening and narrowing of arteries that supply blood to: °? Your heart. This can cause a heart   attack. ? Your brain. This can cause a stroke. ? Your kidneys. This can cause kidney failure.  Stress on your heart muscle, which can cause heart failure.  What can I do to lower my risk?  Work with your health care provider to make a hypertension prevention plan that works for you. Follow your plan and keep all follow-up visits as told by your health care provider.  Learn how to check your blood pressure at home. Make sure that you know your personal target  blood pressure, as told by your health care provider. How is this treated? In addition to diet and lifestyle changes, your health care provider may recommend medicines to help lower your blood pressure. You may need to try a few different medicines to find what works best for you. You also may need to take more than one medicine. Take over-the-counter and prescription medicines only as told by your health care provider. Where to find support: Your health care provider can help you prevent hypertension and help you keep your blood pressure at a healthy level. Your local hospital or your community may also provide support services and prevention programs. The American Heart Association offers an online support network at: https://www.lee.net/ Where to find more information: Learn more about hypertension from:  National Heart, Lung, and Blood Institute: https://www.peterson.org/  Centers for Disease Control and Prevention: AboutHD.co.nz  American Academy of Family Physicians: http://familydoctor.org/familydoctor/en/diseases-conditions/high-blood-pressure.printerview.all.html  Learn more about the DASH diet from:  National Heart, Lung, and Blood Institute: WedMap.it  Contact a health care provider if:  You think you are having a reaction to medicines you have taken.  You have recurrent headaches or feel dizzy.  You have swelling in your ankles.  You have trouble with your vision. Summary  Hypertension often does not cause any symptoms until blood pressure is very high. It is important to get your blood pressure checked regularly.  Diet and lifestyle changes are the most important steps in preventing hypertension.  By keeping your blood pressure in a healthy range, you can prevent complications like heart attack, heart failure, stroke, and kidney failure.  Work with your health  care provider to make a hypertension prevention plan that works for you. This information is not intended to replace advice given to you by your health care provider. Make sure you discuss any questions you have with your health care provider. Document Released: 02/21/2015 Document Revised: 10/18/2015 Document Reviewed: 10/18/2015 Elsevier Interactive Patient Education  2018 [employer].   How to Take Your Blood Pressure Blood pressure is a measurement of how strongly your blood is pressing against the walls of your arteries. Arteries are blood vessels that carry blood from your heart throughout your body. Your health care provider takes your blood pressure at each office visit. You can also take your own blood pressure at home with a blood pressure machine. You may need to take your own blood pressure:  To confirm a diagnosis of high blood pressure (hypertension).  To monitor your blood pressure over time.  To make sure your blood pressure medicine is working.  Supplies needed: To take your blood pressure, you will need a blood pressure machine. You can buy a blood pressure machine, or blood pressure monitor, at most drugstores or online. There are several types of home blood pressure monitors. When choosing one, consider the following:  Choose a monitor that has an arm cuff.  Choose a monitor that wraps snugly around your upper arm. You should be able to fit  only one finger between your arm and the cuff.  Do not choose a monitor that measures your blood pressure from your wrist or finger.  Your health care provider can suggest a reliable monitor that will meet your needs. How to prepare To get the most accurate reading, avoid the following for 30 minutes before you check your blood pressure:  Drinking caffeine.  Drinking alcohol.  Eating.  Smoking.  Exercising.  Five minutes before you check your blood pressure:  Empty your bladder.  Sit quietly without talking in a  dining chair, rather than in a soft couch or armchair.  How to take your blood pressure To check your blood pressure, follow the instructions in the manual that came with your blood pressure monitor. If you have a digital blood pressure monitor, the instructions may be as follows: 1. Sit up straight. 2. Place your feet on the floor. Do not cross your ankles or legs. 3. Rest your left arm at the level of your heart on a table or desk or on the arm of a chair. 4. Pull up your shirt sleeve. 5. Wrap the blood pressure cuff around the upper part of your left arm, 1 inch (2.5 cm) above your elbow. It is best to wrap the cuff around bare skin. 6. Fit the cuff snugly around your arm. You should be able to place only one finger between the cuff and your arm. 7. Position the cord inside the groove of your elbow. 8. Press the power button. 9. Sit quietly while the cuff inflates and deflates. 10. Read the digital reading on the monitor screen and write it down (record it). 11. Wait 2-3 minutes, then repeat the steps, starting at step 1.  What does my blood pressure reading mean? A blood pressure reading consists of a higher number over a lower number. Ideally, your blood pressure should be below 120/80. The first ("top") number is called the systolic pressure. It is a measure of the pressure in your arteries as your heart beats. The second ("bottom") number is called the diastolic pressure. It is a measure of the pressure in your arteries as the heart relaxes. Blood pressure is classified into four stages. The following are the stages for adults who do not have a short-term serious illness or a chronic condition. Systolic pressure and diastolic pressure are measured in a unit called mm Hg. Normal  Systolic pressure: below 120.  Diastolic pressure: below 80. Elevated  Systolic pressure: 120-129.  Diastolic pressure: below 80. Hypertension stage 1  Systolic pressure: 130-139.  Diastolic pressure:  80-89. Hypertension stage 2  Systolic pressure: 140 or above.  Diastolic pressure: 90 or above. You can have prehypertension or hypertension even if only the systolic or only the diastolic number in your reading is higher than normal. Follow these instructions at home:  Check your blood pressure as often as recommended by your health care provider.  Take your monitor to the next appointment with your health care provider to make sure: ? That you are using it correctly. ? That it provides accurate readings.  Be sure you understand what your goal blood pressure numbers are.  Tell your health care provider if you are having any side effects from blood pressure medicine. Contact a health care provider if:  Your blood pressure is consistently high. Get help right away if:  Your systolic blood pressure is higher than 180.  Your diastolic blood pressure is higher than 110. This information is not intended to replace  advice given to you by your health care provider. Make sure you discuss any questions you have with your health care provider. Document Released: 07/16/2015 Document Revised: 09/28/2015 Document Reviewed: 07/16/2015 Elsevier Interactive Patient Education  [employer].

## 2017-09-04 NOTE — Discharge Summary (Addendum)
Discharge Summary  Sara Norris Alkire WUJ:[id number] DOB: [birth year]  PCP: Wanda PlumpPaz, Jose E, MD  Admit date: 09/03/2017 Discharge date: 09/04/2017  Time spent: 25 minutes  Recommendations for Outpatient Follow-up:  1. Follow-up with your PCP 2. Take your medications as prescribed  Discharge Diagnoses:  Active Hospital Problems   Diagnosis Date Noted  . Hypertensive crisis 09/03/2017  . Headache 09/03/2017  . Insomnia 06/17/2013  . Essential hypertension 04/09/2009    Resolved Hospital Problems  No resolved problems to display.    Discharge Condition: Stable  Diet recommendation: Low-sodium diet  Vitals:   09/04/17 0005 09/04/17 0536  BP: 137/84 123/63  Pulse:  91  Resp:  20  Temp:  98.4 F (36.9 C)  SpO2:  98%    History of present illness:  Sara Norris Oesterling is Norris 52 y.o. female with Norris medical history of essential hypertension, anxiety, who presented to the emergency department with complaints of high blood pressure and dizziness of 1 day duration.    Found to have accelerated hypertension.  Admitted for hypertensive urgency/crisis.  Restarted on p.o. medication Hyzaar which she was not taking for unknown reason.  Tolerated well.  On the day of discharge, the patient was hemodynamically stable.  She will need to follow up with her primary care provider post hospitalization.     Hospital Course:  Active Problems:   Essential hypertension   Insomnia   Hypertensive crisis   Headache  Hypertensive urgency/crisis, resolved Continue Coreg and Hyzaar Follow-up with primary care provider outpatient  Headache Tylenol as needed at home  Chronic hypokalemia Potassium 3.2 repleted with KCl p.o. 40 mEq once and IV magnesium 2 g once Continue potassium supplement at home 10 mEq daily Follow-up with PCP outpatient  Insomnia Continue Ambien as needed likely    Procedures:  None  Consultations:  None  Discharge Exam: BP 123/63 (BP Location: Right Arm)   Pulse 91    Temp 98.4 F (36.9 C) (Oral)   Resp 20   Ht 5\' 7"  (1.702 m)   Wt 98.5 kg (217 lb 2.5 oz)   SpO2 98%   BMI 34.01 kg/m  . General: 52 y.o. year-old female well developed well nourished in no acute distress.  Alert and oriented x3. . Cardiovascular: Regular rate and rhythm with no rubs or gallops.  No thyromegaly or JVD noted.   Marland Kitchen. Respiratory: Clear to auscultation with no wheezes or rales. Good inspiratory effort. . Abdomen: Soft nontender nondistended with normal bowel sounds x4 quadrants. . Musculoskeletal: No lower extremity edema. 2/4 pulses in all 4 extremities. . Skin: No ulcerative lesions noted or rashes, . Psychiatry: Mood is appropriate for condition and setting  Discharge Instructions You were cared for by Norris hospitalist during your hospital stay. If you have any questions about your discharge medications or the care you received while you were in the hospital after you are discharged, you can call the unit and asked to speak with the hospitalist on call if the hospitalist that took care of you is not available. Once you are discharged, your primary care physician will handle any further medical issues. Please note that NO REFILLS for any discharge medications will be authorized once you are discharged, as it is imperative that you return to your primary care physician (or establish Norris relationship with Norris primary care physician if you do not have one) for your aftercare needs so that they can reassess your need for medications and monitor your lab values.   Allergies  as of 09/04/2017      Reactions   Soy Allergy    Itching of throat.   Citrus Rash   Codeine Rash   Pollen Extract    sneezing      Medication List    TAKE these medications   aspirin EC 81 MG tablet Take 81 mg by mouth daily.   carvedilol 6.25 MG tablet Commonly known as:  COREG Take 1 tablet (6.25 mg total) by mouth 2 (two) times daily with Norris meal.   EPINEPHrine 0.3 mg/0.3 mL Soaj injection Commonly known  as:  EPI-PEN Inject 0.3 mg into the muscle once.   losartan-hydrochlorothiazide 100-12.5 MG tablet Commonly known as:  HYZAAR Take 1 tablet by mouth daily.   potassium chloride 10 MEQ tablet Commonly known as:  K-DUR Take 1 tablet (10 mEq total) by mouth daily. What changed:    when to take this  reasons to take this   Vitamin D3 400 units tablet Take 400 Units by mouth daily.   zolpidem 10 MG tablet Commonly known as:  AMBIEN Take 1 tablet (10 mg total) by mouth at bedtime as needed for sleep.      Allergies  Allergen Reactions  . Soy Allergy     Itching of throat.  . Citrus Rash  . Codeine Rash  . Pollen Extract     sneezing   Follow-up Information    Wanda Plump, MD. Call in 1 day(s).   Specialty:  Internal Medicine Why:  Please call for an appointment. Contact information: [address] [place] [address] [phone]            The results of significant diagnostics from this hospitalization (including imaging, microbiology, ancillary and laboratory) are listed below for reference.    Significant Diagnostic Studies: Ct Head Wo Contrast  Result Date: 09/03/2017 CLINICAL DATA:  Near syncope.  Hypertension. EXAM: CT HEAD WITHOUT CONTRAST TECHNIQUE: Contiguous axial images were obtained from the base of the skull through the vertex without intravenous contrast. COMPARISON:  MRI and CT 01/10/2008 FINDINGS: Brain: No acute intracranial abnormality. Specifically, no hemorrhage, hydrocephalus, mass lesion, acute infarction, or significant intracranial injury. Vascular: No hyperdense vessel or unexpected calcification. Skull: No acute calvarial abnormality. Sinuses/Orbits: Visualized paranasal sinuses and mastoids clear. Orbital soft tissues unremarkable. Other: None IMPRESSION: No acute intracranial abnormality. Electronically Signed   By: Charlett Nose M.D.   On: 09/03/2017 12:25    Microbiology: No results found for this or any previous visit  (from the past 240 hour(s)).   Labs: Basic Metabolic Panel: Recent Labs  Lab 09/03/17 1110 09/04/17 0550  NA 140 138  K 3.4* 3.2*  CL 101 101  CO2 28 25  GLUCOSE 99 110*  BUN 22* 16  CREATININE 0.90 0.89  CALCIUM 9.6 9.5   Liver Function Tests: No results for input(s): AST, ALT, ALKPHOS, BILITOT, PROT, ALBUMIN in the last 168 hours. No results for input(s): LIPASE, AMYLASE in the last 168 hours. No results for input(s): AMMONIA in the last 168 hours. CBC: Recent Labs  Lab 09/03/17 1110  WBC 5.1  HGB 13.3  HCT 39.8  MCV 90.9  PLT 474*   Cardiac Enzymes: Recent Labs  Lab 09/03/17 1110  TROPONINI <0.03   BNP: BNP (last 3 results) No results for input(s): BNP in the last 8760 hours.  ProBNP (last 3 results) No results for input(s): PROBNP in the last 8760 hours.  CBG: Recent Labs  Lab 09/03/17 1100  GLUCAP 106*       Signed:  Darlin Drop, MD [facility] 09/04/2017, 11:37 AM

## 2017-09-05 ENCOUNTER — Telehealth: Payer: Self-pay

## 2017-09-05 NOTE — Telephone Encounter (Signed)
TCM Hospital follw up call made to patient. Left message for  Return call.

## 2017-09-06 ENCOUNTER — Inpatient Hospital Stay: Payer: 59 | Admitting: Internal Medicine

## 2017-09-06 ENCOUNTER — Telehealth: Payer: Self-pay

## 2017-09-06 ENCOUNTER — Ambulatory Visit (INDEPENDENT_AMBULATORY_CARE_PROVIDER_SITE_OTHER): Payer: 59 | Admitting: Internal Medicine

## 2017-09-06 ENCOUNTER — Encounter: Payer: Self-pay | Admitting: Internal Medicine

## 2017-09-06 ENCOUNTER — Encounter (INDEPENDENT_AMBULATORY_CARE_PROVIDER_SITE_OTHER): Payer: Self-pay

## 2017-09-06 VITALS — BP 136/88 | HR 76 | Temp 97.7°F | Resp 16 | Ht 66.0 in | Wt 201.4 lb

## 2017-09-06 DIAGNOSIS — I169 Hypertensive crisis, unspecified: Secondary | ICD-10-CM

## 2017-09-06 MED ORDER — POTASSIUM CHLORIDE ER 10 MEQ PO TBCR: 10.0000 meq | EXTENDED_RELEASE_TABLET | Freq: Every day | ORAL | 6 refills | Status: DC

## 2017-09-06 MED ORDER — LOSARTAN POTASSIUM-HCTZ 100-12.5 MG PO TABS: 1.0000 | ORAL_TABLET | Freq: Every day | ORAL | 6 refills | Status: DC

## 2017-09-06 MED ORDER — CARVEDILOL 6.25 MG PO TABS: 6.2500 mg | ORAL_TABLET | Freq: Two times a day (BID) | ORAL | 6 refills | Status: DC

## 2017-09-06 NOTE — Telephone Encounter (Signed)
To be seen today

## 2017-09-06 NOTE — Assessment & Plan Note (Signed)
Hypertensive crisis/emergency:  Admitted to the hospital with a BP over 200, she was symptomatic, this happened in the context of the patient not taking Hyzaar, unclear why. She is now essentially asymptomatic except for some fatigue, taking her medications correctly. Continue with carvedilol, Hyzaar, potassium.  Will send refills to be sure she does not run out. Encouraged to monitor BPs 3 times a week, follow a low-salt diet, keep herself hydrated. Had hypokalemia, treated in the inpatient setting. Will RTC in about 10 days to get a BMP and a mg level. Otherwise keep her follow-up for September as a schedule.  Call if problems.

## 2017-09-06 NOTE — Telephone Encounter (Signed)
09/06/17   Transition Care Management Follow-up Telephone Call  ADMISSION DATE: 09/03/17 DISCHARGE DATE: 09/04/17   How have you been since you were released from the hospital?  Feeling weak per patient and has headache.  Do you understand why you were in the hospital? Yes   Do you understand the discharge instrcutions? Yes    Items Reviewed:  Medications reviewed: Yes  Allergies reviewed: Yes, Codeine   Dietary changes reviewed: Low sodium   Referrals reviewed: Follow up appointment scheduled.   Functional Questionnaire:   Activities of Daily Living (ADLs): Patient can perform all independently.  Any patient concerns? Headaches continued   Confirmed importance and date/time of follow-up visits scheduled: Yes    Confirmed with patient if condition begins to worsen call PCP or go to the ER. Yes      Patient was given the office number and encouragred to call back with questions or concerns. Yes

## 2017-09-06 NOTE — Progress Notes (Signed)
Pre visit review using our clinic review tool, if applicable. No additional management support is needed unless otherwise documented below in the visit note. 

## 2017-09-06 NOTE — Progress Notes (Signed)
Subjective:    Patient ID: Sara Norris, female    DOB: [birth year], 52 y.o.   MRN: [id number]  DOS:  09/06/2017 Type of visit - description : TCM 7 Interval history: Admitted to the hospital 09/03/2017, discharged the next day. Presented to the ER with elevated BP and dizziness for 24 hours, + HA.  Diagnosed with a hypertensive urgency/crisis. Was not taking Hyzaar as prescribed previously. Upon arrival blood pressure was 226/128. At the ER she got 3 doses of labetalol, BP decreased a little, at the hospital floor she was restarted on Hyzaar and eventually felt to be stable to be discharged home. Here for follow-up   Review of Systems Since she left the hospital, is taking her medications correctly. No further headache, dizziness.  She still feels tired. No chest pain no difficulty breathing.  No edema. Has  not check her BPs  Past Medical History:  Diagnosis Date  . Allergy    seasonal  . Chronic knee pain   . Colonic diverticular abscess   . Contraception    menopausal  . Diverticulosis   . Hiatal hernia    per pt, not aware of this.  . Hypertension    dx age 81  . Menopause    LMP age 69  . Umbilical hernia    per pt/ nopt aware of this.    Past Surgical History:  Procedure Laterality Date  . CHOLECYSTECTOMY    . MYOMECTOMY     removed fibroids/ when she was in her 30"s  . TONSILLECTOMY      Social History   Socioeconomic History  . Marital status: Single    Spouse name: Not on file  . Number of children: 1  . Years of education: Not on file  . Highest education level: Not on file  Occupational History  . 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 Financial resource strain: Not on file  . Food insecurity:    Worry: Not on file    Inability: Not on file  . Transportation needs:    Medical: Not on file    Non-medical: Not on file  Tobacco Use  . Smoking status: Never Smoker  . Smokeless tobacco:  Never Used  Substance and Sexual Activity  . Alcohol use: Yes    Comment: SOCIAL   . Drug use: No    Comment: no marihuana ;lately   . Sexual activity: Yes    Partners: Male    Comment: 1ST INTERCOURSE- 34, PARTNERS- 5  Lifestyle  . Physical activity:    Days per week: Not on file    Minutes per session: Not on file  . Stress: Not on file  Relationships  . Social connections:    Talks on phone: Not on file    Gets together: Not on file    Attends religious service: Not on file    Active member of club or organization: Not on file    Attends meetings of clubs or organizations: Not on file    Relationship status: Not on file  . Intimate partner violence:    Fear of current or ex partner: Not on file    Emotionally abused: Not on file    Physically abused: Not on file    Forced sexual activity: Not on file  Other Topics Concern  . 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   Citrus Rash   Codeine Rash   Pollen Extract    sneezing      Medication List        Accurate as of 09/06/17  6:04 PM. Always use your most recent med list.          aspirin EC 81 MG tablet Take 81 mg by mouth daily.   carvedilol 6.25 MG tablet Commonly known as:  COREG Take 1 tablet (6.25 mg total) by mouth 2 (two) times daily with a meal.   EPINEPHrine 0.3 mg/0.3 mL Soaj injection Commonly known as:  EPI-PEN Inject 0.3 mg into the muscle once.   losartan-hydrochlorothiazide 100-12.5 MG tablet Commonly known as:  HYZAAR Take 1 tablet by mouth daily.   potassium chloride 10 MEQ tablet Commonly known as:  K-DUR Take 1 tablet (10 mEq total) by mouth daily.   Vitamin D3 400 units tablet Take 400 Units by mouth daily.   zolpidem 10 MG tablet Commonly known as:  AMBIEN Take 1 tablet (10 mg total) by mouth at bedtime as needed  for sleep.          Objective:   Physical Exam BP 136/88 (BP Location: Left Arm, Patient Position: Sitting, Cuff Size: Small)   Pulse 76   Temp 97.7 F (36.5 C) (Oral)   Resp 16   Ht 5\' 6"  (1.676 m)   Wt 201 lb 6 oz (91.3 kg)   SpO2 98%   BMI 32.50 kg/m  General:   Well developed, NAD, see BMI.  HEENT:  Normocephalic . Face symmetric, atraumatic Lungs:  CTA B Normal respiratory effort, no intercostal retractions, no accessory muscle use. Heart: RRR,  no murmur.  no pretibial edema bilaterally  Abdomen:  Not distended, soft, non-tender. No rebound or rigidity.  No bruit Skin: Not pale. Not jaundice Neurologic:  alert & oriented X3.  Speech normal, gait appropriate for age and unassisted Psych--  Cognition and judgment appear intact.  Cooperative with normal attention span and concentration.  Behavior appropriate. No anxious or depressed appearing.     Assessment & Plan:  Assessment > Prediabetes: A1c 6.1 08-2014 HTN DX age 52 Insomnia - on ambien Early menopause - declines HRT H/o Knee pain Diverticulitis, 1st episode 02-2015, + perf, conservative Rx  History of food allergies, citrus?. Has an EpiPen  PLAN: Hypertensive crisis/emergency:  Admitted to the hospital with a BP over 200, she was symptomatic, this happened in the context of the patient not taking Hyzaar, unclear why. She is now essentially asymptomatic except for some fatigue, taking her medications correctly. Continue with carvedilol, Hyzaar, potassium.  Will send refills to be sure she does not run out. Encouraged to monitor BPs 3 times a week, follow a low-salt diet, keep herself hydrated. Had hypokalemia, treated in the inpatient setting. Will RTC in about 10 days to get a BMP and a mg level. Otherwise keep her follow-up for September as a schedule.  Call if problems.

## 2017-09-06 NOTE — Patient Instructions (Signed)
  GO TO THE FRONT DESK Schedule an appointment for blood work for 09/18/2017  Take all your medications as prescribed  Check the  blood pressure 2 or 3 times a   week   Be sure your blood pressure is between 110/65 and  135/85. If it is consistently higher or lower, let me know   Call if you have headache, dizziness or if your energy is not coming back  Keep your appointment for September

## 2017-09-12 ENCOUNTER — Emergency Department (HOSPITAL_COMMUNITY)
Admission: EM | Admit: 2017-09-12 | Discharge: 2017-09-12 | Disposition: A | Discharge status: Home / Self Care | Payer: 59 | Attending: Emergency Medicine | Admitting: Emergency Medicine

## 2017-09-12 ENCOUNTER — Encounter (HOSPITAL_COMMUNITY): Payer: Self-pay | Admitting: *Deleted

## 2017-09-12 ENCOUNTER — Ambulatory Visit: Payer: Self-pay

## 2017-09-12 DIAGNOSIS — Z79899 Other long term (current) drug therapy: Secondary | ICD-10-CM | POA: Diagnosis not present

## 2017-09-12 DIAGNOSIS — I10 Essential (primary) hypertension: Secondary | ICD-10-CM | POA: Diagnosis not present

## 2017-09-12 DIAGNOSIS — R51 Headache: Secondary | ICD-10-CM | POA: Diagnosis not present

## 2017-09-12 MED ORDER — HYDRALAZINE HCL 20 MG/ML IJ SOLN
5.0000 mg | Freq: Once | INTRAMUSCULAR | Status: AC
  Administered 2017-09-12: 5 mg via INTRAVENOUS
  Filled 2017-09-12: qty 1

## 2017-09-12 NOTE — ED Provider Notes (Signed)
[facility]-EMERGENCY DEPT Provider Note   CSN: [id number] Arrival date & time: 09/12/17  1118     History   Chief Complaint Chief Complaint  Patient presents with  . Headache  . Hypertension    HPI Sara Norris is a 52 y.o. female.  Patient states that she was having a mild headache and they took her blood pressure and it was severely elevated.  She was recently admitted to the hospital with hypertensive crisis and they added losartan to her medicines.  Patient has mild headache now  The history is provided by the patient.  Headache   This is a recurrent problem. The problem occurs constantly. The problem has been gradually improving. The headache is associated with nothing. The pain is located in the left unilateral region. The quality of the pain is described as dull. The pain is at a severity of 2/10. The pain is mild. The pain does not radiate. Pertinent negatives include no anorexia.  Hypertension  Associated symptoms include headaches. Pertinent negatives include no chest pain and no abdominal pain.    Past Medical History:  Diagnosis Date  . Allergy    seasonal  . Chronic knee pain   . Colonic diverticular abscess   . Contraception    menopausal  . Diverticulosis   . Hiatal hernia    per pt, not aware of this.  . Hypertension    dx age 45  . Menopause    LMP age 77  . Umbilical hernia    per pt/ nopt aware of this.    Patient Active Problem List   Diagnosis Date Noted  . Hypertensive crisis 09/03/2017  . Headache 09/03/2017  . Acute diverticulitis, h/o 03/08/2015  . PCP NOTES >>>>> 01/16/2015  . Insomnia 06/17/2013  . General medical examination 05/24/2010  . Essential hypertension 04/09/2009    Past Surgical History:  Procedure Laterality Date  . CHOLECYSTECTOMY    . MYOMECTOMY     removed fibroids/ when she was in her 30"s  . TONSILLECTOMY       OB History    Gravida  3   Para  1   Term      Preterm      AB    2   Living  1     SAB      TAB      Ectopic      Multiple      Live Births               Home Medications    Prior to Admission medications   Medication Sig Start Date End Date Taking? Authorizing Provider  aspirin EC 81 MG tablet Take 81 mg by mouth daily.   Yes [provider]  carvedilol (COREG) 6.25 MG tablet Take 1 tablet (6.25 mg total) by mouth 2 (two) times daily with a meal. 09/06/17  Yes Paz, Nolon Rod, MD  Cholecalciferol (VITAMIN D3) 400 units tablet Take 400 Units by mouth daily.   Yes [provider]  losartan-hydrochlorothiazide (HYZAAR) 100-12.5 MG tablet Take 1 tablet by mouth daily. 09/06/17  Yes Paz, Nolon Rod, MD  potassium chloride (K-DUR) 10 MEQ tablet Take 1 tablet (10 mEq total) by mouth daily. 09/06/17  Yes Paz, Nolon Rod, MD  zolpidem (AMBIEN) 10 MG tablet Take 1 tablet (10 mg total) by mouth at bedtime as needed for sleep. 05/21/17  Yes Paz, Elita Quick E, MD  EPINEPHrine 0.3 mg/0.3 mL IJ SOAJ injection Inject  0.3 mg into the muscle once.    [provider]    Family History Family History  Problem Relation Age of Onset  . Hypertension Mother   . Diabetes Mother   . Arthritis Mother   . Gout Mother   . Hypertension Father   . Diabetes Father        GP  . Hypertension Unknown        GP  . CAD Maternal Grandmother   . Stroke Neg Hx   . Colon cancer Neg Hx   . Breast cancer Neg Hx     Social History Social History   Tobacco Use  . Smoking status: Never Smoker  . Smokeless tobacco: Never Used  Substance Use Topics  . Alcohol use: Yes    Comment: SOCIAL   . Drug use: No    Comment: no marihuana ;lately      Allergies   Soy allergy; Citrus; Codeine; and Pollen extract   Review of Systems Review of Systems  Constitutional: Negative for appetite change and fatigue.  HENT: Negative for congestion, ear discharge and sinus pressure.   Eyes: Negative for discharge.  Respiratory: Negative for cough.   Cardiovascular:  Negative for chest pain.  Gastrointestinal: Negative for abdominal pain, anorexia and diarrhea.  Genitourinary: Negative for frequency and hematuria.  Musculoskeletal: Negative for back pain.  Skin: Negative for rash.  Neurological: Positive for headaches. Negative for seizures.  Psychiatric/Behavioral: Negative for hallucinations.     Physical Exam Updated Vital Signs BP (!) 192/108   Pulse 98   Temp 98.7 F (37.1 C)   Resp 18   Ht 5\' 8"  (1.727 m)   Wt 90.7 kg (200 lb)   SpO2 95%   BMI 30.41 kg/m   Physical Exam  Constitutional: She is oriented to person, place, and time. She appears well-developed.  HENT:  Head: Normocephalic.  Eyes: Conjunctivae and EOM are normal. No scleral icterus.  Neck: Neck supple. No thyromegaly present.  Cardiovascular: Normal rate and regular rhythm. Exam reveals no gallop and no friction rub.  No murmur heard. Pulmonary/Chest: No stridor. She has no wheezes. She has no rales. She exhibits no tenderness.  Abdominal: She exhibits no distension. There is no tenderness. There is no rebound.  Musculoskeletal: Normal range of motion. She exhibits no edema.  Lymphadenopathy:    She has no cervical adenopathy.  Neurological: She is oriented to person, place, and time. She exhibits normal muscle tone. Coordination normal.  Skin: No rash noted. No erythema.  Psychiatric: She has a normal mood and affect. Her behavior is normal.     ED Treatments / Results  Labs (all labs ordered are listed, but only abnormal results are displayed) Labs Reviewed - No data to display  EKG None  Radiology No results found.  Procedures Procedures (including critical care time)  Medications Ordered in ED Medications  hydrALAZINE (APRESOLINE) injection 5 mg (5 mg Intravenous Given 09/12/17 1203)     Initial Impression / Assessment and Plan / ED Course  I have reviewed the triage vital signs and the nursing notes.  Pertinent labs & imaging results that were  available during my care of the patient were reviewed by me and considered in my medical decision making (see chart for details).     Patient with poorly controlled blood pressure.  Patient was given hydralazine and her headache improved.  I have told her that we should increase her Coreg from 6.25 mg twice a day to 12.5 mg  twice a day and she will follow-up with PCP  Final Clinical Impressions(s) / ED Diagnoses   Final diagnoses:  Essential hypertension    ED Discharge Orders    None       Bethann BerkshireZammit, Molley Houser, MD 09/12/17 1345

## 2017-09-12 NOTE — ED Notes (Signed)
Pt is alert and oriented x 4 and is verbally responsive. Pt reports 6/10 linger HA with associated HTN.

## 2017-09-12 NOTE — ED Triage Notes (Signed)
Pt complains of headache and hypertension. Pt was working when she had her blood pressure taken, had SBP of 217. Pt was seen for same last week but states headache has remained.

## 2017-09-12 NOTE — Discharge Instructions (Addendum)
Increase your Coreg so you are taking 12.5 mg twice a day.  Follow-up with your doctor in a week.  Return if problems

## 2017-09-12 NOTE — Telephone Encounter (Signed)
Incoming call from patient with complaint of headache an elevated blood pressures.  Pressures are as reported:  Left arm : 207/125 right  Arm:  220/ 125 earlier.  At the time of triaging; 217/ 134  Right arm, 204/ 134  Left arm. Patient reports she has a headache.  States she has taken all of medications except HCTZ. States she was going to take it upon arrival at home from work.  Provided care advice.  Per protocol  Recommended patient  Seek care at ER. Patient voiced understanding, states she will go to ER.

## 2017-09-12 NOTE — Telephone Encounter (Signed)
  Reason for Disposition . [1] Systolic BP  >= 160 OR Diastolic >= 100 AND [2] cardiac or neurologic symptoms (e.g., chest pain, difficulty breathing, unsteady gait, blurred vision)  Answer Assessment - Initial Assessment Questions 1. BLOOD PRESSURE: "What is the blood pressure?" "Did you take at least two measurements 5 minutes apart?"     *No Answer* 2. ONSET: "When did you take your blood pressure?"     Last week 3. HOW: "How did you obtain the blood pressure?" (e.g., visiting nurse, automatic home BP monitor) automatic     *No Answer* 4. HISTORY: "Do you have a history of high blood pressure?"     Yes 1994 5. MEDICATIONS: "Are you taking any medications for blood pressure?" "Have you missed any doses recently?"     Yes  6. OTHER SYMPTOMS: "Do you have any symptoms?" (e.g., headache, chest pain, blurred vision, difficulty breathing, weakness)     Headache ,  A little blurried 7. PREGNANCY: "Is there any chance you are pregnant?" "When was your last menstrual period?"     n/a  Protocols used: HIGH BLOOD PRESSURE-A-AH

## 2017-09-16 ENCOUNTER — Telehealth: Payer: Self-pay | Admitting: Internal Medicine

## 2017-09-16 DIAGNOSIS — I169 Hypertensive crisis, unspecified: Secondary | ICD-10-CM

## 2017-09-16 NOTE — Telephone Encounter (Signed)
Went to the ER,BP was elevated and she had a headache, they increased carvedilol to 12.5 mg twice a day. Please call the patient, if she is doing better and  BPs controlled simply follow-up in September as recommended previously, if not, arrange for a sooner follow-up.

## 2017-09-17 NOTE — Telephone Encounter (Signed)
Phoned patient,states as of now she feels fine. States when she comes in in September she would like to be referred to Cardiology for evaluation. States she bought a home BOP monitor. Patient states she will call office if she feels ljke she needs to be seen before her next scheduled appointment in September.

## 2017-09-17 NOTE — Telephone Encounter (Signed)
Please schedule a OV w/ me sooner that September, will discuss cards referral then however if she desires a referral anyway, ok to arrange

## 2017-09-18 NOTE — Telephone Encounter (Signed)
LMOM asking for call back. Cardiology referral placed as requested by Pt.

## 2017-09-26 ENCOUNTER — Other Ambulatory Visit: Payer: Self-pay | Admitting: Internal Medicine

## 2017-09-26 NOTE — Telephone Encounter (Signed)
Pt requesting refill on Ambien. Paz Pt.   Last OV: 09/06/2017 Last Fill: 05/21/2017 #30 and 3rf UDS: Not needed for Ambien per PCP  NCCR in media 05/24/2017- no discrepancies noted

## 2017-09-27 MED FILL — ZOLPIDEM TARTRATE 10 MG TAB: 10 | 30 days supply | Qty: 30 | Fill #0

## 2017-10-29 MED FILL — ZOLPIDEM TARTRATE 10 MG TAB: 10 | 30 days supply | Qty: 30 | Fill #1

## 2017-11-05 ENCOUNTER — Ambulatory Visit: Payer: 59 | Admitting: Internal Medicine

## 2017-11-26 ENCOUNTER — Ambulatory Visit (INDEPENDENT_AMBULATORY_CARE_PROVIDER_SITE_OTHER): Payer: 59 | Admitting: Internal Medicine

## 2017-11-26 ENCOUNTER — Encounter: Payer: Self-pay | Admitting: Internal Medicine

## 2017-11-26 VITALS — BP 138/80 | HR 74 | Temp 98.0°F | Resp 16 | Ht 66.0 in | Wt 202.2 lb

## 2017-11-26 DIAGNOSIS — N390 Urinary tract infection, site not specified: Secondary | ICD-10-CM

## 2017-11-26 DIAGNOSIS — I10 Essential (primary) hypertension: Secondary | ICD-10-CM

## 2017-11-26 DIAGNOSIS — R399 Unspecified symptoms and signs involving the genitourinary system: Secondary | ICD-10-CM | POA: Diagnosis not present

## 2017-11-26 LAB — URINALYSIS, ROUTINE W REFLEX MICROSCOPIC
Bilirubin Urine: NEGATIVE
Ketones, ur: NEGATIVE
Nitrite: NEGATIVE
Specific Gravity, Urine: 1.025 (ref 1.000–1.030)
URINE GLUCOSE: NEGATIVE
UROBILINOGEN UA: 0.2 (ref 0.0–1.0)
pH: 5.5 (ref 5.0–8.0)

## 2017-11-26 LAB — POC URINALSYSI DIPSTICK (AUTOMATED)
BILIRUBIN UA: NEGATIVE
GLUCOSE UA: NEGATIVE
Ketones, UA: NEGATIVE
NITRITE UA: NEGATIVE
Protein, UA: POSITIVE — AB
Spec Grav, UA: 1.025 (ref 1.010–1.025)
Urobilinogen, UA: 0.2 E.U./dL
pH, UA: 6 (ref 5.0–8.0)

## 2017-11-26 LAB — BASIC METABOLIC PANEL
BUN: 19 mg/dL (ref 6–23)
CALCIUM: 9.6 mg/dL (ref 8.4–10.5)
CO2: 31 mEq/L (ref 19–32)
Chloride: 102 mEq/L (ref 96–112)
Creatinine, Ser: 0.84 mg/dL (ref 0.40–1.20)
GFR: 91.5 mL/min (ref >60.00)
GLUCOSE: 99 mg/dL (ref 70–99)
Potassium: 4 mEq/L (ref 3.5–5.1)
SODIUM: 139 meq/L (ref 135–145)

## 2017-11-26 MED ORDER — NITROFURANTOIN MONOHYD MACRO 100 MG PO CAPS: 100.0000 mg | ORAL_CAPSULE | Freq: Two times a day (BID) | ORAL | 0 refills | Status: DC

## 2017-11-26 MED FILL — NITROFURANTOIN MONO-MCR 100: 100 | 3 days supply | Qty: 6 | Fill #0

## 2017-11-26 NOTE — Patient Instructions (Signed)
GO TO THE LAB : Get the blood work     GO TO THE FRONT DESK Schedule your next appointment for a checkup in 3 months  Start the antibiotic for 3 days  Drink plenty fluids  Call if you are not gradually better

## 2017-11-26 NOTE — Progress Notes (Signed)
Subjective:    Patient ID: Sara Norris, female    DOB: [birth year], 52 y.o.   MRN: [id number]  DOS:  11/26/2017 Type of visit - description : Acute Interval history: Symptoms of started about 10 days ago with some urinary urgency and frequency. She is drinking plenty of fluids and cranberry juice but the symptoms continue. HTN: Good compliance with medication, BP today is excellent  Review of Systems Denies fever chills. No vaginal discharge or gross hematuria.  Urine looks  slightly cloudy. Minimal lower abdominal discomfort.  No flank pain.  Past Medical History:  Diagnosis Date  . Allergy    seasonal  . Chronic knee pain   . Colonic diverticular abscess   . Contraception    menopausal  . Diverticulosis   . Hiatal hernia    per pt, not aware of this.  . Hypertension    dx age 6  . Menopause    LMP age 2  . Umbilical hernia    per pt/ nopt aware of this.    Past Surgical History:  Procedure Laterality Date  . CHOLECYSTECTOMY    . MYOMECTOMY     removed fibroids/ when she was in her 30"s  . TONSILLECTOMY      Social History   Socioeconomic History  . Marital status: Single    Spouse name: Not on file  . Number of children: 1  . Years of education: Not on file  . Highest education level: Not on file  Occupational History  . 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 Financial resource strain: Not on file  . Food insecurity:    Worry: Not on file    Inability: Not on file  . Transportation needs:    Medical: Not on file    Non-medical: Not on file  Tobacco Use  . Smoking status: Never Smoker  . Smokeless tobacco: Never Used  Substance and Sexual Activity  . Alcohol use: Yes    Comment: SOCIAL   . Drug use: No    Comment: no marijuana ;lately   . Sexual activity: Yes    Partners: Male    Comment: 1ST INTERCOURSE- 29, PARTNERS- 5  Lifestyle  . Physical activity:    Days per week: Not on file    Minutes per session: Not on file  . Stress: Not on file  Relationships  . Social connections:    Talks on phone: Not on file    Gets together: Not on file    Attends religious service: Not on file    Active member of club or organization: Not on file    Attends meetings of clubs or organizations: Not on file    Relationship status: Not on file  . Intimate partner violence:    Fear of current or ex partner: Not on file    Emotionally abused: Not on file    Physically abused: Not on file    Forced sexual activity: Not on file  Other Topics Concern  . 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   Citrus Rash   Codeine Rash   Pollen Extract    sneezing      Medication List  Accurate as of 11/26/17  9:02 PM. Always use your most recent med list.          aspirin EC 81 MG tablet Take 81 mg by mouth daily.   carvedilol 6.25 MG tablet Commonly known as:  COREG Take 1 tablet (6.25 mg total) by mouth 2 (two) times daily with a meal.   EPINEPHrine 0.3 mg/0.3 mL Soaj injection Commonly known as:  EPI-PEN Inject 0.3 mg into the muscle once.   losartan-hydrochlorothiazide 100-12.5 MG tablet Commonly known as:  HYZAAR Take 1 tablet by mouth daily.   nitrofurantoin (macrocrystal-monohydrate) 100 MG capsule Commonly known as:  MACROBID Take 1 capsule (100 mg total) by mouth 2 (two) times daily.   potassium chloride 10 MEQ tablet Commonly known as:  K-DUR Take 1 tablet (10 mEq total) by mouth daily.   Vitamin D3 400 units tablet Take 400 Units by mouth daily.   zolpidem 10 MG tablet Commonly known as:  AMBIEN TAKE 1 TABLET BY MOUTH AT BEDTIME AS NEEDED FOR SLEEP.          Objective:   Physical Exam BP 138/80 (BP Location: Left Arm, Patient Position: Sitting, Cuff Size: Normal)   Pulse 74   Temp 98  F (36.7 C) (Oral)   Resp 16   Ht 5\' 6"  (1.676 m)   Wt 202 lb 4 oz (91.7 kg)   SpO2 98%   BMI 32.64 kg/m  General:   Well developed, NAD, see BMI.  HEENT:  Normocephalic . Face symmetric, atraumatic Abdomen:  Not distended, soft, minimal suprapubic discomfort on palpation, no mass or rebound.  No CVA tenderness Skin: Not pale. Not jaundice Neurologic:  alert & oriented X3.  Speech normal, gait appropriate for age and unassisted Psych--  Cognition and judgment appear intact.  Cooperative with normal attention span and concentration.  Behavior appropriate. No anxious or depressed appearing.     Assessment & Plan:   Assessment  Prediabetes: A1c 6.1 08-2014 HTN DX age 65 Insomnia - on ambien Early menopause - declines HRT H/o Knee pain Diverticulitis, 1st episode 02-2015, + perf, conservative Rx  History of food allergies, citrus?. Has an EpiPen  PLAN: UTI: U dip showed leukocytes, symptoms C/W UTI, no history of recurrent UTIs.  Send a UA, urine culture, Macrobid for 3 days, call if not better HTN: Controlled, needs a BMP. RTC 3 months for a checkup

## 2017-11-26 NOTE — Progress Notes (Signed)
Pre visit review using our clinic review tool, if applicable. No additional management support is needed unless otherwise documented below in the visit note. 

## 2017-11-26 NOTE — Assessment & Plan Note (Signed)
UTI: U dip showed leukocytes, symptoms C/W UTI, no history of recurrent UTIs.  Send a UA, urine culture, Macrobid for 3 days, call if not better HTN: Controlled, needs a BMP. RTC 3 months for a checkup

## 2017-11-28 LAB — URINE CULTURE
MICRO NUMBER: 91202042
SPECIMEN QUALITY:: ADEQUATE

## 2017-11-30 MED FILL — ZOLPIDEM TARTRATE 10 MG TAB: 10 | 30 days supply | Qty: 30 | Fill #2

## 2018-01-01 MED FILL — ZOLPIDEM TARTRATE 10 MG TAB: 10 | 30 days supply | Qty: 30 | Fill #3

## 2018-01-29 ENCOUNTER — Telehealth: Payer: Self-pay | Admitting: Family Medicine

## 2018-01-30 MED FILL — ZOLPIDEM TARTRATE 10 MG TAB: 10 | 30 days supply | Qty: 30 | Fill #0

## 2018-01-30 NOTE — Telephone Encounter (Signed)
Sent!

## 2018-01-30 NOTE — Telephone Encounter (Signed)
Pt is requesting refill on Ambien.   Last OV: 11/26/2017 Last Fill: 09/27/2017 #30 and 3RF UDS: None  NCCR printed- no discrepancies noted- sent for scanning

## 2018-02-21 ENCOUNTER — Telehealth: Payer: Self-pay | Admitting: *Deleted

## 2018-02-21 NOTE — Telephone Encounter (Signed)
Received FMLA/STD paperwork from [employer], completed as much as possible; forwarded to provider/SLS 01/02

## 2018-02-25 MED FILL — POTASSIUM CHL ER M10 TABLET: 10 | 30 days supply | Qty: 30 | Fill #0

## 2018-02-28 MED FILL — ZOLPIDEM TARTRATE 10 MG TAB: 10 | 30 days supply | Qty: 30 | Fill #1

## 2018-02-28 NOTE — Telephone Encounter (Signed)
FMLA requested.  I reviewed the chart, she has a history of hypertensive crisis, FMLA completed hoping that  she won't  have any problem getting off work to be checked for her blood pressure thus avoiding a crisis.

## 2018-03-25 DIAGNOSIS — H5213 Myopia, bilateral: Secondary | ICD-10-CM | POA: Diagnosis not present

## 2018-03-27 MED FILL — CARVEDILOL 6.25 MG TABLET: 6.25 | 30 days supply | Qty: 60 | Fill #0

## 2018-04-03 MED FILL — ZOLPIDEM TARTRATE 10 MG TAB: 10 | 30 days supply | Qty: 30 | Fill #2

## 2018-04-29 ENCOUNTER — Ambulatory Visit: Payer: Self-pay | Admitting: *Deleted

## 2018-04-29 ENCOUNTER — Telehealth: Payer: Self-pay

## 2018-04-29 NOTE — Telephone Encounter (Signed)
Duplicate encounter

## 2018-04-29 NOTE — Telephone Encounter (Signed)
I notice she has not been at the ER, encouraged her to go.  Her BP has been as high as 245/133. JP 3:12 PM

## 2018-04-29 NOTE — Telephone Encounter (Signed)
Needs office visit this week. Check BPs 2-3 times a day, bring log. If BP remains elevated (more than 160/90) and has symptoms, needs to go to the ER

## 2018-04-29 NOTE — Telephone Encounter (Signed)
Spoke w/ Pt- informed of PCP recommendations. Appt scheduled 05/02/2018 at 1040, Pt states she will try to make it, informed if she doesn't think she can make it I recommend she go to ED to be seen somewhere to have BP meds adjusted. Pt verbalized understanding.

## 2018-04-29 NOTE — Telephone Encounter (Signed)
FYI. Pt going to [facility] ED.

## 2018-04-29 NOTE — Telephone Encounter (Addendum)
Copied from CRM [id number]. Topic: General - Other >> Apr 29, 2018  3:47 PM Lynne Logan D wrote: Reason for CRM: Pt stated that she took her BP medication and potassium and had her BP checked again and it went back down to 183/175. She stated she is feeling better and did not go to the ED. Please advise.   FYI. See above.

## 2018-04-29 NOTE — Telephone Encounter (Signed)
LMOM informing Pt to return call. She has not gone to ED, asked that she call back w/ update.

## 2018-04-29 NOTE — Telephone Encounter (Signed)
Copied from CRM [id number]. Topic: General - Other >> Apr 29, 2018  3:47 PM Lynne Logan D wrote: Reason for CRM: Pt stated that she took her BP medication and potassium and had her BP checked again and it went back down to 183/175. She stated she is feeling better and did not go to the ED. Please advise.

## 2018-04-29 NOTE — Telephone Encounter (Signed)
Agree, she needs to go to the ED.  

## 2018-04-29 NOTE — Telephone Encounter (Signed)
Pt called with a b/p of 245/133 that was taken just now at work. She works at [employer] and the nurse advised her to go to the ED.  She called requesting to get an appointment with her provider. She is also complaining some blurred vision, slight headache, some fatigue and pain in both shoulders that feel like a muscle spasm. Which she has been having this ache in her shoulders since last week. She has also started drinking espresso but has not cut back on it.  Pt was then advised to go to the Ed at [facility] to r/o any signs of having a heart attack. Pt voiced understanding.  Will route to flow at [facility] at [facility].   Reason for Disposition . [1] Systolic BP  >= 160 OR Diastolic >= 100 AND [2] cardiac or neurologic symptoms (e.g., chest pain, difficulty breathing, unsteady gait, blurred vision)  Answer Assessment - Initial Assessment Questions 1. BLOOD PRESSURE: "What is the blood pressure?" "Did you take at least two measurements 5 minutes apart?"     245/133  2. ONSET: "When did you take your blood pressure?"     today 3. HOW: "How did you obtain the blood pressure?" (e.g., visiting nurse, automatic home BP monitor)     Taken at work [employer] 4. HISTORY: "Do you have a history of high blood pressure?"     yes 5. MEDICATIONS: "Are you taking any medications for blood pressure?" "Have you missed any doses recently?"     Yes, have not missed any 6. OTHER SYMPTOMS: "Do you have any symptoms?" (e.g., headache, chest pain, blurred vision, difficulty breathing, weakness)     Blurred vision, headache (slight) 7. PREGNANCY: "Is there any chance you are pregnant?" "When was your last menstrual period?"     No periods  Protocols used: HIGH BLOOD PRESSURE-A-AH

## 2018-05-02 ENCOUNTER — Ambulatory Visit (INDEPENDENT_AMBULATORY_CARE_PROVIDER_SITE_OTHER): Payer: 59 | Admitting: Internal Medicine

## 2018-05-02 ENCOUNTER — Other Ambulatory Visit: Payer: Self-pay

## 2018-05-02 ENCOUNTER — Encounter: Payer: Self-pay | Admitting: Internal Medicine

## 2018-05-02 ENCOUNTER — Telehealth: Payer: Self-pay | Admitting: Internal Medicine

## 2018-05-02 VITALS — BP 200/98 | HR 74 | Temp 98.2°F | Resp 16 | Ht 66.0 in | Wt 204.2 lb

## 2018-05-02 DIAGNOSIS — I169 Hypertensive crisis, unspecified: Secondary | ICD-10-CM

## 2018-05-02 DIAGNOSIS — I160 Hypertensive urgency: Secondary | ICD-10-CM

## 2018-05-02 LAB — BASIC METABOLIC PANEL
BUN: 20 mg/dL (ref 6–23)
CALCIUM: 10 mg/dL (ref 8.4–10.5)
CO2: 30 meq/L (ref 19–32)
CREATININE: 0.86 mg/dL (ref 0.40–1.20)
Chloride: 102 mEq/L (ref 96–112)
GFR: 83.64 mL/min (ref >60.00)
Glucose, Bld: 96 mg/dL (ref 70–99)
Potassium: 4.7 mEq/L (ref 3.5–5.1)
Sodium: 140 mEq/L (ref 135–145)

## 2018-05-02 LAB — CBC WITH DIFFERENTIAL/PLATELET
BASOS ABS: 0.1 10*3/uL (ref 0.0–0.1)
BASOS PCT: 2.4 % (ref 0.0–3.0)
EOS ABS: 0.2 10*3/uL (ref 0.0–0.7)
Eosinophils Relative: 4.4 % (ref 0.0–5.0)
HCT: 42.2 % (ref 36.0–46.0)
Hemoglobin: 14.1 g/dL (ref 12.0–15.0)
LYMPHS ABS: 2.1 10*3/uL (ref 0.7–4.0)
Lymphocytes Relative: 43.7 % (ref 12.0–46.0)
MCHC: 33.4 g/dL (ref 30.0–36.0)
MCV: 91.1 fl (ref 78.0–100.0)
MONOS PCT: 7.4 % (ref 3.0–12.0)
Monocytes Absolute: 0.4 10*3/uL (ref 0.1–1.0)
NEUTROS PCT: 42.1 % — AB (ref 43.0–77.0)
Neutro Abs: 2.1 10*3/uL (ref 1.4–7.7)
Platelets: 380 10*3/uL (ref 150.0–400.0)
RBC: 4.63 Mil/uL (ref 3.87–5.11)
RDW: 13.5 % (ref 11.5–15.5)
WBC: 4.9 10*3/uL (ref 4.0–10.5)

## 2018-05-02 MED ORDER — CARVEDILOL 6.25 MG PO TABS: 6.2500 mg | ORAL_TABLET | Freq: Two times a day (BID) | ORAL | 6 refills | Status: DC

## 2018-05-02 MED ORDER — LOSARTAN POTASSIUM-HCTZ 100-12.5 MG PO TABS: 1.0000 | ORAL_TABLET | Freq: Every day | ORAL | 6 refills | Status: DC

## 2018-05-02 MED ORDER — POTASSIUM CHLORIDE ER 10 MEQ PO TBCR: 10.0000 meq | EXTENDED_RELEASE_TABLET | Freq: Every day | ORAL | 6 refills | Status: DC

## 2018-05-02 MED FILL — CARVEDILOL 6.25 MG TABLET: 6.25 | 30 days supply | Qty: 60 | Fill #0

## 2018-05-02 MED FILL — HYDROCHLOROTHIAZIDE 12.5 MG: 12.5 | 30 days supply | Qty: 30 | Fill #0

## 2018-05-02 MED FILL — LOSARTAN POTASSIUM 100 MG T: 100 | 30 days supply | Qty: 30 | Fill #0

## 2018-05-02 NOTE — Progress Notes (Signed)
Pt aware that HTN is a life long illness that may require life long daily medications possibly multiple medications. She also verbalizes understanding that elevated uncontrolled BPs can cause heart attack, stroke, aortic dissection.

## 2018-05-02 NOTE — Telephone Encounter (Signed)
Copied from CRM [id number]. Topic: Quick Communication - See Telephone Encounter >> May 02, 2018  2:48 PM Arlyss Gandy, NT wrote: CRM for notification. See Telephone encounter for: 05/02/18. Pt calling to let Dr. Drue Novel know that her pharmacy will not have the losartan-hydrochlorothiazide (HYZAAR) 100-12.5 MG tablet in stock until tomorrow. Please advise.

## 2018-05-02 NOTE — Patient Instructions (Addendum)
GO TO THE LAB : Get the blood work     GO TO THE FRONT DESK Schedule your next appointment   for a checkup in 2 weeks  Your blood pressure today is 200/100, very high.  You need to get back on your blood pressure medication.  We do not need to drop your blood pressure too quickly.  Take carvedilol and potassium daily  Hyzaar: Take half tablet daily for 3 days, then 1 tablet daily.    Check the  blood pressure daily Be sure your blood pressure is between 110/65 and  135/85. Call your blood pressure readings in 4 days  HOW TO TAKE YOUR BLOOD PRESSURE:   Rest 5 minutes before taking your blood pressure.   Don't smoke or drink caffeinated beverages for at least 30 minutes before.   Take your blood pressure before (not after) you eat.   Sit comfortably with your back supported and both feet on the floor (don't cross your legs).   Elevate your arm to heart level on a table or a desk.   Use the proper sized cuff. It should fit smoothly and snugly around your bare upper arm. There should be enough room to slip a fingertip under the cuff. The bottom edge of the cuff should be 1 inch above the crease of the elbow.   Ideally, take 3 measurements at one sitting and record the average.    Of you develop chest pain, difficulty breathing, headache, nausea, swelling in your legs: Go to the ER.  Uncontrolled high blood pressure put you at risk of death, heart attacks, strokes, disability. You can control your blood pressure by taking your medications regularly Please visit the American Heart Association website more information.  www.heart.org

## 2018-05-02 NOTE — Progress Notes (Signed)
Subjective:    Patient ID: Sara Norris, female    DOB: 12/12/1965, 53 y.o.   MRN: [id number]  DOS:  05/02/2018 Type of visit - description: Acute visit She ran out of Hyzaar a month ago. Still taking potassium and carvedilol. BP at work was 245/133 3 days ago. Denies taking ibuprofen or other NSAIDs    Review of Systems Denies headache, nausea or vomiting. No chest pain, difficulty breathing, edema.  No palpitations or DOE. She did report bilateral bicipital shoulder pain on and off for 1 week.   Past Medical History:  Diagnosis Date  . Allergy    seasonal  . Chronic knee pain   . Colonic diverticular abscess   . Contraception    menopausal  . Diverticulosis   . Hiatal hernia    per pt, not aware of this.  . Hypertension    dx age 82  . Menopause    LMP age 28  . Umbilical hernia    per pt/ nopt aware of this.    Past Surgical History:  Procedure Laterality Date  . CHOLECYSTECTOMY    . MYOMECTOMY     removed fibroids/ when she was in her 30"s  . TONSILLECTOMY      Social History   Socioeconomic History  . Marital status: Single    Spouse name: Not on file  . Number of children: 1  . Years of education: Not on file  . Highest education level: Not on file  Occupational History  . 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 Financial resource strain: Not on file  . Food insecurity:    Worry: Not on file    Inability: Not on file  . Transportation needs:    Medical: Not on file    Non-medical: Not on file  Tobacco Use  . Smoking status: Never Smoker  . Smokeless tobacco: Never Used  Substance and Sexual Activity  . Alcohol use: Yes    Comment: SOCIAL   . Drug use: No    Comment: no marijuana ;lately   . Sexual activity: Yes    Partners: Male    Comment: 1ST INTERCOURSE- 61, PARTNERS- 5  Lifestyle  . Physical activity:    Days per week: Not on file    Minutes per session: Not on file  .  Stress: Not on file  Relationships  . Social connections:    Talks on phone: Not on file    Gets together: Not on file    Attends religious service: Not on file    Active member of club or organization: Not on file    Attends meetings of clubs or organizations: Not on file    Relationship status: Not on file  . Intimate partner violence:    Fear of current or ex partner: Not on file    Emotionally abused: Not on file    Physically abused: Not on file    Forced sexual activity: Not on file  Other Topics Concern  . 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   Citrus Rash   Codeine Rash   Pollen Extract    sneezing      Medication List  Accurate as of May 02, 2018 11:59 PM. Always use your most recent med list.        aspirin EC 81 MG tablet Take 81 mg by mouth daily.   carvedilol 6.25 MG tablet Commonly known as:  COREG Take 1 tablet (6.25 mg total) by mouth 2 (two) times daily with a meal.   EPINEPHrine 0.3 mg/0.3 mL Soaj injection Commonly known as:  EPI-PEN Inject 0.3 mg into the muscle once.   hydrochlorothiazide 12.5 MG tablet Commonly known as:  HYDRODIURIL Take 1 tablet (12.5 mg total) by mouth daily.   losartan 100 MG tablet Commonly known as:  COZAAR Take 1 tablet (100 mg total) by mouth daily.   nitrofurantoin (macrocrystal-monohydrate) 100 MG capsule Commonly known as:  MACROBID Take 1 capsule (100 mg total) by mouth 2 (two) times daily.   potassium chloride 10 MEQ tablet Commonly known as:  K-DUR Take 1 tablet (10 mEq total) by mouth daily.   Vitamin D3 10 MCG (400 UNIT) tablet Take 400 Units by mouth daily.   zolpidem 10 MG tablet Commonly known as:  AMBIEN TAKE 1 TABLET BY MOUTH AT BEDTIME AS NEEDED FOR SLEEP.           Objective:   Physical Exam BP (!) 200/98 (BP  Location: Left Arm, Patient Position: Sitting, Cuff Size: Normal)   Pulse 74   Temp 98.2 F (36.8 C) (Oral)   Resp 16   Ht 5\' 6"  (1.676 m)   Wt 204 lb 4 oz (92.6 kg)   SpO2 93%   BMI 32.97 kg/m  General:   Well developed, NAD, BMI noted. HEENT:  Normocephalic . Face symmetric, atraumatic Lungs:  CTA B Normal respiratory effort, no intercostal retractions, no accessory muscle use. Heart: RRR,  no murmur.  No pretibial edema bilaterally  Skin: Not pale. Not jaundice Funduscopy: Undilated exam showed no papilledema.  I saw some cotton spots on the  L side.  No hemorrhage. Neurologic:  alert & oriented X3.  Speech normal, gait appropriate for age and unassisted Psych--  Cognition and judgment appear intact.  Cooperative with normal attention span and concentration.  Behavior appropriate. No anxious or depressed appearing.      Assessment     Assessment  Prediabetes: A1c 6.1 08-2014 HTN DX age 57 Insomnia - on ambien Early menopause - declines HRT H/o Knee pain Diverticulitis, 1st episode 02-2015, + perf, conservative Rx  History of food allergies, citrus?. Has an EpiPen  PLAN: HTN: BP extremity elevated, she has no symptoms, I checked it manually in both arms: 200/100. Patient education: I advised patient that HTN is lifelong issue, she will require medication the rest of her life, she will need to check her blood pressure twice a week for the rest of her life.  She is  aware of the risk of death, stroke, heart attack, aortic dissection, disability w/ uncontrolled BP.  She verbalized understanding of all that.  I asked my nurse to go after me and be sure pt  Understood me and she again verbalized understanding. She has a hypertensive urgency (asymptomatic elevated BP, no acute findings on the undilated funduscopy, checking labs to determine kidney function)  in the setting of noncompliance with medication.  Patient reports she ran out of Hyzaar a month ago, pharmacy called,  the last time they dispense the medication was July 2019, 30 tablets. My nurse  was told Hyzaar is available  Plan: BMP, CBC. Refill losartan, carvedilol, potassium. Per literature, may not  be in her best interest to drop the blood pressure too rapidly  Thus was advised to take half Hyzaar the first 3 days then 1 tablet daily, check BP daily for now, call with readings in 4 days. Refer to ophthalmology. Shoulders and bilateral biceps pain, reassess on RTC  RTC 2 weeks  Today, I spent more than  30  min with the patient: >50% of the time counseling regards consequences of uncontrolled BP

## 2018-05-02 NOTE — Progress Notes (Signed)
Pre visit review using our clinic review tool, if applicable. No additional management support is needed unless otherwise documented below in the visit note. 

## 2018-05-03 MED ORDER — HYDROCHLOROTHIAZIDE 12.5 MG PO TABS: 12.5000 mg | ORAL_TABLET | Freq: Every day | ORAL | 6 refills | Status: DC

## 2018-05-03 MED ORDER — LOSARTAN POTASSIUM 100 MG PO TABS: 100.0000 mg | ORAL_TABLET | Freq: Every day | ORAL | 6 refills | Status: DC

## 2018-05-03 NOTE — Telephone Encounter (Signed)
Spoke w/ Pt- informed of recommendations. Rx's sent. Pt verbalized understanding on how to take medications.

## 2018-05-03 NOTE — Telephone Encounter (Signed)
Spoke w/ [facility] yesterday and was informed that they did have losartan-hctz available.

## 2018-05-03 NOTE — Telephone Encounter (Signed)
I spoke with the pharmacy, prescription change to: Losartan 100 mg 1 p.o. daily HCTZ 12.5 mg 1 p.o. daily 19-month supply from each. Call  patient let her know: Medication will be ready, start losartan 100 mg half tablet daily the first 3 days, then 1 tablet daily HCTZ 1 tablet daily from the beginning.

## 2018-05-04 NOTE — Assessment & Plan Note (Addendum)
  HTN: BP extremity elevated, she has no symptoms, I checked it manually in both arms: 200/100. Patient education: I advised patient that HTN is lifelong issue, she will require medication the rest of her life, she will need to check her blood pressure twice a week for the rest of her life.  She is  aware of the risk of death, stroke, heart attack, aortic dissection, disability w/ uncontrolled BP.  She verbalized understanding of all that.  I asked my nurse to go after me and be sure pt  Understood me and she again verbalized understanding. She has a hypertensive urgency (asymptomatic elevated BP, no acute findings on the undilated funduscopy, checking labs to determine kidney function)  in the setting of noncompliance with medication.  Patient reports she ran out of Hyzaar a month ago, pharmacy called, the last time they dispense the medication was July 2019, 30 tablets. My nurse  was told Hyzaar is available  Plan: BMP, CBC. Refill losartan, carvedilol, potassium. Per literature, may not be in her best interest to drop the blood pressure too rapidly  Thus was advised to take half Hyzaar the first 3 days then 1 tablet daily, check BP daily for now, call with readings in 4 days. Refer to ophthalmology. Shoulders and bilateral biceps pain, reassess on RTC  RTC 2 weeks

## 2018-05-07 ENCOUNTER — Other Ambulatory Visit: Payer: Self-pay

## 2018-05-07 DIAGNOSIS — Z0100 Encounter for examination of eyes and vision without abnormal findings: Secondary | ICD-10-CM

## 2018-05-07 MED FILL — ZOLPIDEM TARTRATE 10 MG TAB: 10 | 30 days supply | Qty: 30 | Fill #3

## 2018-05-09 ENCOUNTER — Telehealth: Payer: Self-pay | Admitting: *Deleted

## 2018-05-09 NOTE — Telephone Encounter (Signed)
Received FMLA/STD paperwork from [employer] stating that pt had exceeded her given days on FMLA paperwork; they are requesting an approval on extension of dates that have exceeded and to complete a new certification with updated amount of Intermittent increased absences allowed during her covered period, completed as much as possible; forwarded to provider/SLS 03/19

## 2018-05-14 ENCOUNTER — Other Ambulatory Visit: Payer: Self-pay

## 2018-05-14 ENCOUNTER — Telehealth (INDEPENDENT_AMBULATORY_CARE_PROVIDER_SITE_OTHER): Payer: 59 | Admitting: Internal Medicine

## 2018-05-14 DIAGNOSIS — I10 Essential (primary) hypertension: Secondary | ICD-10-CM

## 2018-05-14 MED ORDER — AMLODIPINE BESYLATE 5 MG PO TABS: 5.0000 mg | ORAL_TABLET | Freq: Every day | ORAL | 6 refills | Status: DC

## 2018-05-14 MED ORDER — CARVEDILOL 12.5 MG PO TABS: 12.5000 mg | ORAL_TABLET | Freq: Two times a day (BID) | ORAL | 3 refills | Status: DC

## 2018-05-14 MED FILL — AMLODIPINE BESYLATE 5 MG TA: 5 | 30 days supply | Qty: 30 | Fill #0

## 2018-05-14 NOTE — Patient Instructions (Signed)
Sara Norris,  Per our conversation the plan to treat your high blood pressure is:  Continue HCTZ 12.5 mg  Continue losartan 100 mg daily  Increase carvedilol from 6.25 mg to 12.5 mg twice a day, we sent a prescription  Add amlodipine 5 mg 1 tablet daily, we sent a prescription.   Continue checking your blood pressure daily. Remember your goal is to have your blood pressure between 110/65 and 135/85.   I need to see you in 2 weeks, if you do not hear from my office to set the appointment in the next few days please call us.

## 2018-05-14 NOTE — Progress Notes (Signed)
Virtual Visit via Telephone Note  I connected with Sara Norris on 05/14/18 at  3:40 PM EDT by telephone and verified that I am speaking with the correct person using two identifiers.   I discussed the limitations, risks, security and privacy concerns of performing an evaluation and management service by telephone and the availability of in person appointments. I also discussed with the patient that there may be a patient responsible charge related to this service. The patient expressed understanding and agreed to proceed.   History of Present Illness: I spoke with the patient today, reports good compliance with medications. Ambulatory BPs, most recent readings: 170/113 155/115 170/113  Review of systems: No headache, chest pain, difficulty breathing.    Observations/Objective: This is a visit, she sounded well.  Assessment and Plan:  Assessment  Prediabetes: A1c 6.1 08-2014 HTN DX age 53 Insomnia - on ambien Early menopause - declines HRT H/o Knee pain Diverticulitis, 1st episode 02-2015, + perf, conservative Rx  History of food allergies, citrus?. Has an EpiPen  PLAN: Telephone visit HTN: The patient is asymptomatic, blood pressure is still elevated. BP goal around 120/80, "I never been that low".  Patient is again educated about her goals, the risk of severe complications from poorly controlled HTN even if asymptomatic. Plan: Continue potassium 10 mEq daily Continue HCTZ 12.5 mg Continue losartan 100 mg daily Increase carvedilol from 6.25 mg to 12.5 mg B.I.D., new Rx sent Add amlodipine 5 mg, watch for swelling.  Rx sent Staff will call and schedule a face-to-face visit in 2 weeks, unless the coronavirus situation does not improve then we will have to do BMP and a telephone visit.  Follow Up Instructions: As above   I discussed the assessment and treatment plan with the patient. The patient was provided an opportunity to ask questions and all were answered. The  patient agreed with the plan and demonstrated an understanding of the instructions.   The patient was advised to call back or seek an in-person evaluation if the symptoms worsen or if the condition fails to improve as anticipated.  I provided 20 minutes of non-face-to-face time during this encounter.   Willow Ora, MD

## 2018-05-16 ENCOUNTER — Ambulatory Visit: Payer: 59 | Admitting: Internal Medicine

## 2018-05-21 DIAGNOSIS — Z0279 Encounter for issue of other medical certificate: Secondary | ICD-10-CM

## 2018-05-27 ENCOUNTER — Ambulatory Visit: Payer: 59 | Admitting: Internal Medicine

## 2018-05-28 ENCOUNTER — Ambulatory Visit (INDEPENDENT_AMBULATORY_CARE_PROVIDER_SITE_OTHER): Payer: 59 | Admitting: Internal Medicine

## 2018-05-28 ENCOUNTER — Ambulatory Visit: Payer: 59 | Admitting: Internal Medicine

## 2018-05-28 ENCOUNTER — Other Ambulatory Visit: Payer: Self-pay

## 2018-05-28 DIAGNOSIS — I10 Essential (primary) hypertension: Secondary | ICD-10-CM

## 2018-05-28 MED ORDER — AMLODIPINE BESYLATE 10 MG PO TABS: 10.0000 mg | ORAL_TABLET | Freq: Every day | ORAL | 6 refills | Status: DC

## 2018-05-28 NOTE — Progress Notes (Signed)
Subjective:    Patient ID: Sara Norris, female    DOB: [birth year], 53 y.o.   MRN: [id number]  DOS:  05/28/2018 Type of visit - description: Virtual Visit via Telephone Note  I connected with@ on 05/28/18 at  4:00 PM EDT by telephone and verified that I am speaking with the correct person using two identifiers.  THIS ENCOUNTER IS A VIRTUAL VISIT DUE TO COVID-19 - PATIENT WAS NOT SEEN IN THE OFFICE. PATIENT HAS CONSENTED TO VIRTUAL VISIT / TELEMEDICINE VISIT   Location of patient: home  Location of provider: office  I discussed the limitations, risks, security and privacy concerns of performing an evaluation and management service by telephone and the availability of in person appointments. I also discussed with the patient that there may be a patient responsible charge related to this service. The patient expressed understanding and agreed to proceed.   History of Present Illness: Follow-up Since the last office visit, reports good compliance with medication. Denies side effects. Ambulatory BPs are checked from time to time, the only reading she remembers is 148/109.    Review of Systems Actually feels good. Increased energy Denies chest pain, difficulty breathing.  No edema or palpitations No headaches  Past Medical History:  Diagnosis Date  . Allergy    seasonal  . Chronic knee pain   . Colonic diverticular abscess   . Contraception    menopausal  . Diverticulosis   . Hiatal hernia    per pt, not aware of this.  . Hypertension    dx age 28  . Menopause    LMP age 46  . Umbilical hernia    per pt/ nopt aware of this.    Past Surgical History:  Procedure Laterality Date  . CHOLECYSTECTOMY    . MYOMECTOMY     removed fibroids/ when she was in her 30"s  . TONSILLECTOMY      Social History   Socioeconomic History  . Marital status: Single    Spouse name: Not on file  . Number of children: 1  . Years of education: Not on file  . Highest education level:  Not on file  Occupational History  . 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 Financial resource strain: Not on file  . Food insecurity:    Worry: Not on file    Inability: Not on file  . Transportation needs:    Medical: Not on file    Non-medical: Not on file  Tobacco Use  . Smoking status: Never Smoker  . Smokeless tobacco: Never Used  Substance and Sexual Activity  . Alcohol use: Yes    Comment: SOCIAL   . Drug use: No    Comment: no marijuana ;lately   . Sexual activity: Yes    Partners: Male    Comment: 1ST INTERCOURSE- 44, PARTNERS- 5  Lifestyle  . Physical activity:    Days per week: Not on file    Minutes per session: Not on file  . Stress: Not on file  Relationships  . Social connections:    Talks on phone: Not on file    Gets together: Not on file    Attends religious service: Not on file    Active member of club or organization: Not on file    Attends meetings of clubs or organizations: Not on file    Relationship status: Not on file  . Intimate partner violence:  Fear of current or ex partner: Not on file    Emotionally abused: Not on file    Physically abused: Not on file    Forced sexual activity: Not on file  Other Topics Concern  . 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   Citrus Rash   Codeine Rash   Pollen Extract    sneezing      Medication List       Accurate as of May 28, 2018  4:01 PM. Always use your most recent med list.        amLODipine 5 MG tablet Commonly known as:  NORVASC Take 1 tablet (5 mg total) by mouth daily.   aspirin EC 81 MG tablet Take 81 mg by mouth daily.   carvedilol 12.5 MG tablet Commonly known as:  COREG Take 1 tablet (12.5 mg total) by mouth 2 (two) times daily with a meal.   EPINEPHrine  0.3 mg/0.3 mL Soaj injection Commonly known as:  EPI-PEN Inject 0.3 mg into the muscle once.   hydrochlorothiazide 12.5 MG tablet Commonly known as:  HYDRODIURIL Take 1 tablet (12.5 mg total) by mouth daily.   losartan 100 MG tablet Commonly known as:  COZAAR Take 1 tablet (100 mg total) by mouth daily.   nitrofurantoin (macrocrystal-monohydrate) 100 MG capsule Commonly known as:  MACROBID Take 1 capsule (100 mg total) by mouth 2 (two) times daily.   potassium chloride 10 MEQ tablet Commonly known as:  K-DUR Take 1 tablet (10 mEq total) by mouth daily.   Vitamin D3 10 MCG (400 UNIT) tablet Take 400 Units by mouth daily.   zolpidem 10 MG tablet Commonly known as:  AMBIEN TAKE 1 TABLET BY MOUTH AT BEDTIME AS NEEDED FOR SLEEP.           Objective:   Physical Exam There were no vitals taken for this visit. This is a Immunologistvirtual  Phone visit.  She is alert oriented x3, in no apparent distress    Assessment     Assessment  Prediabetes: A1c 6.1 08-2014 HTN DX age 53 Insomnia - on ambien Early menopause - declines HRT H/o Knee pain Diverticulitis, 1st episode 02-2015, + perf, conservative Rx  History of food allergies, citrus?. Has an EpiPen  PLAN: HTN: Currently on: -Potassium 10 mEq daily -HCTZ 12.5 mg -Losartan 100 mg daily -carvedilol  12.5 mg B.I.D  -amlodipine 5 mg qd BP has improved but is still elevated. Plan: Increase amlodipine to 10 mg daily, new prescription sent, will check on her over the phone 3 weeks from today.   Time spent 15 minutes

## 2018-05-29 NOTE — Assessment & Plan Note (Signed)
HTN: Currently on: -Potassium 10 mEq daily -HCTZ 12.5 mg -Losartan 100 mg daily -carvedilol  12.5 mg B.I.D  -amlodipine 5 mg qd BP has improved but is still elevated. Plan: Increase amlodipine to 10 mg daily, new prescription sent, will check on her over the phone 3 weeks from today.

## 2018-06-10 ENCOUNTER — Telehealth: Payer: Self-pay | Admitting: Internal Medicine

## 2018-06-10 MED FILL — ZOLPIDEM TARTRATE 10 MG TAB: 10 | 30 days supply | Qty: 30 | Fill #0

## 2018-06-10 MED FILL — AMLODIPINE BESYLATE 10 MG T: 10 | 30 days supply | Qty: 30 | Fill #0

## 2018-06-10 NOTE — Telephone Encounter (Signed)
Sent!

## 2018-06-10 NOTE — Telephone Encounter (Signed)
Pt is requesting refill on Ambien.   Last OV: 05/28/2018 Last Fill: 01/30/2018 #30 and 3RF

## 2018-06-26 ENCOUNTER — Telehealth: Payer: Self-pay

## 2018-06-26 NOTE — Telephone Encounter (Signed)
LMOM informing Pt she is due for 3 week f/u- informed still doing virtual visits. Instructed to call office to schedule appt.

## 2018-07-10 ENCOUNTER — Other Ambulatory Visit: Payer: Self-pay | Admitting: Internal Medicine

## 2018-07-17 ENCOUNTER — Other Ambulatory Visit: Payer: Self-pay

## 2018-07-17 ENCOUNTER — Ambulatory Visit (INDEPENDENT_AMBULATORY_CARE_PROVIDER_SITE_OTHER): Payer: 59 | Admitting: Internal Medicine

## 2018-07-17 DIAGNOSIS — G4700 Insomnia, unspecified: Secondary | ICD-10-CM | POA: Diagnosis not present

## 2018-07-17 DIAGNOSIS — I10 Essential (primary) hypertension: Secondary | ICD-10-CM

## 2018-07-17 MED ORDER — ZOLPIDEM TARTRATE 10 MG PO TABS: 10.0000 mg | ORAL_TABLET | Freq: Every evening | ORAL | 1 refills | Status: DC | PRN

## 2018-07-17 MED ORDER — CARVEDILOL 12.5 MG PO TABS: 12.5000 mg | ORAL_TABLET | Freq: Two times a day (BID) | ORAL | 12 refills | Status: DC

## 2018-07-17 MED FILL — CARVEDILOL 12.5 MG TABLET: 12.5 | 30 days supply | Qty: 60 | Fill #0

## 2018-07-17 MED FILL — ZOLPIDEM TARTRATE 10 MG TAB: 10 | 30 days supply | Qty: 30 | Fill #0

## 2018-07-17 NOTE — Progress Notes (Signed)
Subjective:    Patient ID: Sara Norris, female    DOB: 02/16/1966, 53 y.o.   MRN: [id number]  DOS:  07/17/2018 Type of visit - description: Attempted  to make this a video visit, due to technical difficulties from the patient side it was not possible  thus we proceeded with a Virtual Visit via Telephone    I connected with@ on 07/18/18 at 11:20 AM EDT by telephone and verified that I am speaking with the correct person using two identifiers.  THIS ENCOUNTER IS A VIRTUAL VISIT DUE TO COVID-19 - PATIENT WAS NOT SEEN IN THE OFFICE. PATIENT HAS CONSENTED TO VIRTUAL VISIT / TELEMEDICINE VISIT   Location of patient: home  Location of provider: office  I discussed the limitations, risks, security and privacy concerns of performing an evaluation and management service by telephone and the availability of in person appointments. I also discussed with the patient that there may be a patient responsible charge related to this service. The patient expressed understanding and agreed to proceed.   History of Present Illness: Follow-up HTN: Good compliance with medication, ambulatory BPs are "much better", "normal".  States that she has checked her BP several times but could not give me any readings. Insomnia: Needs a refill on Ambien. Lifestyle has improved, she is more active and feeling well   Review of Systems Denies fever chills No chest pain no difficulty breathing No lower extremity edema No headaches No cough  Past Medical History:  Diagnosis Date  . Allergy    seasonal  . Chronic knee pain   . Colonic diverticular abscess   . Contraception    menopausal  . Diverticulosis   . Hiatal hernia    per pt, not aware of this.  . Hypertension    dx age 89  . Menopause    LMP age 18  . Umbilical hernia    per pt/ nopt aware of this.    Past Surgical History:  Procedure Laterality Date  . CHOLECYSTECTOMY    . MYOMECTOMY     removed fibroids/ when she was in her 30"s  .  TONSILLECTOMY      Social History   Socioeconomic History  . Marital status: Single    Spouse name: Not on file  . Number of children: 1  . Years of education: Not on file  . Highest education level: Not on file  Occupational History  . 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 Financial resource strain: Not on file  . Food insecurity:    Worry: Not on file    Inability: Not on file  . Transportation needs:    Medical: Not on file    Non-medical: Not on file  Tobacco Use  . Smoking status: Never Smoker  . Smokeless tobacco: Never Used  Substance and Sexual Activity  . Alcohol use: Yes    Comment: SOCIAL   . Drug use: No    Comment: no marijuana ;lately   . Sexual activity: Yes    Partners: Male    Comment: 1ST INTERCOURSE- 3, PARTNERS- 5  Lifestyle  . Physical activity:    Days per week: Not on file    Minutes per session: Not on file  . Stress: Not on file  Relationships  . Social connections:    Talks on phone: Not on file    Gets together: Not on file    Attends religious service: Not on  file    Active member of club or organization: Not on file    Attends meetings of clubs or organizations: Not on file    Relationship status: Not on file  . Intimate partner violence:    Fear of current or ex partner: Not on file    Emotionally abused: Not on file    Physically abused: Not on file    Forced sexual activity: Not on file  Other Topics Concern  . 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   Citrus Rash   Codeine Rash   Pollen Extract    sneezing      Medication List       Accurate as of Jul 17, 2018 11:59 PM. If you have any questions, ask your nurse or doctor.        STOP taking these medications   nitrofurantoin  (macrocrystal-monohydrate) 100 MG capsule Commonly known as:  MACROBID Stopped by:  Willow OraJose Paz, MD     TAKE these medications   amLODipine 10 MG tablet Commonly known as:  NORVASC Take 1 tablet (10 mg total) by mouth daily.   aspirin EC 81 MG tablet Take 81 mg by mouth daily.   carvedilol 12.5 MG tablet Commonly known as:  COREG Take 1 tablet (12.5 mg total) by mouth 2 (two) times daily with a meal.   EPINEPHrine 0.3 mg/0.3 mL Soaj injection Commonly known as:  EPI-PEN Inject 0.3 mg into the muscle once.   hydrochlorothiazide 12.5 MG tablet Commonly known as:  HYDRODIURIL Take 1 tablet (12.5 mg total) by mouth daily.   losartan 100 MG tablet Commonly known as:  COZAAR Take 1 tablet (100 mg total) by mouth daily.   potassium chloride 10 MEQ tablet Commonly known as:  K-DUR Take 1 tablet (10 mEq total) by mouth daily.   Vitamin D3 10 MCG (400 UNIT) tablet Take 400 Units by mouth daily.   zolpidem 10 MG tablet Commonly known as:  AMBIEN Take 1 tablet (10 mg total) by mouth at bedtime as needed. for sleep           Objective:   Physical Exam There were no vitals taken for this visit. This is phone visit, patient is alert oriented x3, no apparent distress    Assessment     Assessment  Prediabetes: A1c 6.1 08-2014 HTN DX age 53 Insomnia - on ambien Early menopause - declines HRT H/o Knee pain Diverticulitis, 1st episode 02-2015, + perf, conservative Rx  History of food allergies, citrus?. Has an EpiPen  PLAN: HTN: Good compliance with potassium, HCTZ, losartan, carvedilol, amlodipine without apparent side effects specifically denies edema.  She tells me her ambulatory BPs are normal but could not give me any readings.  I again emphasized the need to keep the blood pressure around 120/80 and not to run out of medications.  She verbalized understanding. Insomnia: Needs a refill on Ambien, sent. Social: Encouraged to continue with her very lifestyle, continue  taking daily walks RTC 4 months, will call and schedule.    I discussed the assessment and treatment plan with the patient. The patient was provided an opportunity to ask questions and all were answered. The patient agreed with the plan and demonstrated an understanding of  the instructions.   The patient was advised to call back or seek an in-person evaluation if the symptoms worsen or if the condition fails to improve as anticipated.  I provided 15 minutes of non-face-to-face time during this encounter.  Willow Ora, MD

## 2018-07-18 NOTE — Assessment & Plan Note (Signed)
HTN: Good compliance with potassium, HCTZ, losartan, carvedilol, amlodipine without apparent side effects specifically denies edema.  She tells me her ambulatory BPs are normal but could not give me any readings.  I again emphasized the need to keep the blood pressure around 120/80 and not to run out of medications.  She verbalized understanding. Insomnia: Needs a refill on Ambien, sent. Social: Encouraged to continue with her very lifestyle, continue taking daily walks RTC 4 months, will call and schedule.

## 2018-08-15 MED FILL — ZOLPIDEM TARTRATE 10 MG TAB: 10 | 30 days supply | Qty: 30 | Fill #1

## 2018-09-03 ENCOUNTER — Encounter: Payer: Self-pay | Admitting: Internal Medicine

## 2018-09-03 ENCOUNTER — Ambulatory Visit: Payer: 59 | Admitting: Internal Medicine

## 2018-09-03 ENCOUNTER — Other Ambulatory Visit: Payer: Self-pay

## 2018-09-03 VITALS — BP 220/117 | HR 72 | Temp 98.2°F | Resp 16 | Ht 66.0 in | Wt 209.0 lb

## 2018-09-03 DIAGNOSIS — R399 Unspecified symptoms and signs involving the genitourinary system: Secondary | ICD-10-CM

## 2018-09-03 DIAGNOSIS — Z9119 Patient's noncompliance with other medical treatment and regimen: Secondary | ICD-10-CM

## 2018-09-03 DIAGNOSIS — Z91199 Patient's noncompliance with other medical treatment and regimen due to unspecified reason: Secondary | ICD-10-CM

## 2018-09-03 DIAGNOSIS — I160 Hypertensive urgency: Secondary | ICD-10-CM | POA: Diagnosis not present

## 2018-09-03 LAB — POC URINALSYSI DIPSTICK (AUTOMATED)
Bilirubin, UA: NEGATIVE
Glucose, UA: NEGATIVE
Ketones, UA: NEGATIVE
Nitrite, UA: NEGATIVE
Protein, UA: POSITIVE — AB
Spec Grav, UA: 1.025 (ref 1.010–1.025)
Urobilinogen, UA: 0.2 E.U./dL
pH, UA: 6 (ref 5.0–8.0)

## 2018-09-03 LAB — URINALYSIS, ROUTINE W REFLEX MICROSCOPIC
Bilirubin Urine: NEGATIVE
Ketones, ur: NEGATIVE
Nitrite: NEGATIVE
Specific Gravity, Urine: 1.025 (ref 1.000–1.030)
Total Protein, Urine: 30 — AB
Urine Glucose: NEGATIVE
Urobilinogen, UA: 0.2 (ref 0.0–1.0)
pH: 6 (ref 5.0–8.0)

## 2018-09-03 MED ORDER — CARVEDILOL 12.5 MG PO TABS: 12.5000 mg | ORAL_TABLET | Freq: Two times a day (BID) | ORAL | 6 refills | Status: DC

## 2018-09-03 MED ORDER — HYDROCHLOROTHIAZIDE 12.5 MG PO TABS: 12.5000 mg | ORAL_TABLET | Freq: Every day | ORAL | 6 refills | Status: DC

## 2018-09-03 MED ORDER — LOSARTAN POTASSIUM 100 MG PO TABS: 100.0000 mg | ORAL_TABLET | Freq: Every day | ORAL | 6 refills | Status: DC

## 2018-09-03 MED ORDER — AMLODIPINE BESYLATE 10 MG PO TABS: 10.0000 mg | ORAL_TABLET | Freq: Every day | ORAL | 6 refills | Status: DC

## 2018-09-03 MED FILL — LOSARTAN POTASSIUM 100 MG T: 100 | 30 days supply | Qty: 30 | Fill #0

## 2018-09-03 MED FILL — HYDROCHLOROTHIAZIDE 12.5 MG: 12.5 | 30 days supply | Qty: 30 | Fill #0

## 2018-09-03 MED FILL — CARVEDILOL 12.5 MG TABLET: 12.5 | 30 days supply | Qty: 60 | Fill #0

## 2018-09-03 MED FILL — AMLODIPINE BESYLATE 10 MG T: 10 | 30 days supply | Qty: 30 | Fill #0

## 2018-09-03 NOTE — Patient Instructions (Addendum)
  GO TO THE FRONT DESK Schedule your next appointment For this Friday July 17 in the morning  Take your medications as prescribed every day  Avoid ibuprofen  For pain: Tylenol 500 mg 2 tablets 3 times a day.   Check the  blood pressure 2 or 3 times a day, write your readings and bring them with you on Friday Blood pressure goal: is between 110/65 and  135/85. If it is consistently higher or lower, let me know  Avoid salty snacks  ER if: Chest pain, headache, difficulty breathing.

## 2018-09-03 NOTE — Progress Notes (Signed)
Pre visit review using our clinic review tool, if applicable. No additional management support is needed unless otherwise documented below in the visit note. 

## 2018-09-03 NOTE — Progress Notes (Signed)
Subjective:    Patient ID: Sara BondsLorrie A Stonerock, female    DOB: 08-Apr-1965, 53 y.o.   MRN: [id number]  DOS:  09/03/2018 Type of visit - description: Acute visit The patient made the appointment with me because yesterday she had left-sided low back pain. She took a muscle relaxant and ibuprofen 800 mg twice. There was no radiation, no numbness, no injury or fall however she did some heavy lifting 2 days prior. This morning, pain is much improved.  We noted her blood pressure to be quite elevated. She reports good compliance with medications. She states she takes her BP all the time and it is "good". I asked for specific readings and she could not tell me.  She remembers her diastolic BP being 97 at some point last week Also states that yesterday she had a "whole bunch of very salty snacks".  Additionally, she complained of urinary frequency but no dysuria.  Review of Systems  No fever chills No chest pain no difficulty breathing No edema No nausea, vomiting, diarrhea Denies visual disturbances.  Past Medical History:  Diagnosis Date  . Allergy    seasonal  . Chronic knee pain   . Colonic diverticular abscess   . Contraception    menopausal  . Diverticulosis   . Hiatal hernia    per pt, not aware of this.  . Hypertension    dx age 53  . Menopause    LMP age 53  . Umbilical hernia    per pt/ nopt aware of this.    Past Surgical History:  Procedure Laterality Date  . CHOLECYSTECTOMY    . MYOMECTOMY     removed fibroids/ when she was in her 30"s  . TONSILLECTOMY      Social History   Socioeconomic History  . Marital status: Single    Spouse name: Not on file  . Number of children: 1  . Years of education: Not on file  . Highest education level: Not on file  Occupational History  . Occupation: Doctor, general practiceenviromental services     Employer: [employer]  Social Needs  . Financial resource strain: Not on file  . Food insecurity    Worry: Not on file   Inability: Not on file  . Transportation needs    Medical: Not on file    Non-medical: Not on file  Tobacco Use  . Smoking status: Never Smoker  . Smokeless tobacco: Never Used  Substance and Sexual Activity  . Alcohol use: Yes    Comment: SOCIAL   . Drug use: No    Comment: no marijuana ;lately   . Sexual activity: Yes    Partners: Male    Comment: 1ST INTERCOURSE- [age], PARTNERS- 5  Lifestyle  . Physical activity    Days per week: Not on file    Minutes per session: Not on file  . Stress: Not on file  Relationships  . Social Musicianconnections    Talks on phone: Not on file    Gets together: Not on file    Attends religious service: Not on file    Active member of club or organization: Not on file    Attends meetings of clubs or organizations: Not on file    Relationship status: Not on file  . Intimate partner violence    Fear of current or ex partner: Not on file    Emotionally abused: Not on file    Physically abused: Not on file    Forced sexual  activity: Not on file  Other Topics Concern  . 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   Citrus Rash   Codeine Rash   Pollen Extract    sneezing      Medication List       Accurate as of September 03, 2018 11:59 PM. If you have any questions, ask your nurse or doctor.        amLODipine 10 MG tablet Commonly known as: NORVASC Take 1 tablet (10 mg total) by mouth daily.   aspirin EC 81 MG tablet Take 81 mg by mouth daily.   carvedilol 12.5 MG tablet Commonly known as: COREG Take 1 tablet (12.5 mg total) by mouth 2 (two) times daily with a meal.   EPINEPHrine 0.3 mg/0.3 mL Soaj injection Commonly known as: EPI-PEN Inject 0.3 mg into the muscle once.   hydrochlorothiazide 12.5 MG tablet Commonly known as: HYDRODIURIL Take 1 tablet (12.5 mg total) by mouth  daily.   losartan 100 MG tablet Commonly known as: COZAAR Take 1 tablet (100 mg total) by mouth daily.   potassium chloride 10 MEQ tablet Commonly known as: K-DUR Take 1 tablet (10 mEq total) by mouth daily.   Vitamin D3 10 MCG (400 UNIT) tablet Take 400 Units by mouth daily.   zolpidem 10 MG tablet Commonly known as: AMBIEN Take 1 tablet (10 mg total) by mouth at bedtime as needed. for sleep           Objective:   Physical Exam BP (!) 220/117 (BP Location: Left Arm, Patient Position: Sitting, Cuff Size: Normal)   Pulse 72   Temp 98.2 F (36.8 C) (Oral)   Resp 16   Ht 5\' 6"  (1.676 m)   Wt 209 lb (94.8 kg)   SpO2 100%   BMI 33.73 kg/m  General:   Well developed, NAD, BMI noted.  HEENT:  Normocephalic . Face symmetric, atraumatic Undilated funduscopy: Funduscopy was limited but arteries are sharp, no disc edema that I can tell. Lungs:  CTA B Normal respiratory effort, no intercostal retractions, no accessory muscle use. Heart: RRR,  no murmur.  no pretibial edema bilaterally  Abdomen:  Not distended, soft, non-tender. No rebound or rigidity.  No CVA tenderness Skin: Not pale. Not jaundice Neurologic:  alert & oriented X3.  Speech normal, gait appropriate for age and unassisted No antalgic posture or gait. Psych--  Cognition and judgment appear intact.  Cooperative with normal attention span and concentration.  Behavior appropriate. No anxious or depressed appearing.     Assessment      Assessment  Prediabetes: A1c 6.1 08-2014 HTN DX age 53 Insomnia - on ambien Early menopause - declines HRT H/o Knee pain Diverticulitis, 1st episode 02-2015, + perf, conservative Rx  History of food allergies, citrus?. Has an EpiPen  PLAN: Back pain: Left-sided back pain, this is the reason why the patient is here.  It resolved since yesterday after she took a muscle relaxant and to ibuprofen 800 mg. Recommend a heating pad, avoid ibuprofen, Tylenol and call if not  better HTN urgency: BP recheck by me 220/110, she is asx She is supposed to be taking losartan 100 mg, carvedilol 12.5 twice a day, amlodipine 10 mg daily, HCTZ 12.5 mg 1  tablet daily, potassium 10 mEq daily. We called her pharmacy to check the actual dispensation of meds: Carvedilol: She got 60 tablets on 07/17/2018 HCTZ she got 30 tablets on 05/02/2018 Losartan: she got #30 tab 05/02/2018. The patient has hypertensive urgency, I suspect due to poor compliance, recent Motrin intake, increase salt intake. I again explained the patient the gravity of the situation, risk of death or long-term disability from a stroke, aortic dissection, CAD etc. discussed with the patient in terms that she can understand.  She verbalized understanding. I asked her that if she cannot afford her medications there are resources in town that could help her. She said that is not an issue Plan: Good compliance with medication, RTC 3 days, refer to cardiology to see if they have any suggestions. UTI?  Wait for UA urine culture for treatment RTC 3 days

## 2018-09-04 ENCOUNTER — Ambulatory Visit (INDEPENDENT_AMBULATORY_CARE_PROVIDER_SITE_OTHER): Payer: Self-pay | Admitting: Nurse Practitioner

## 2018-09-04 VITALS — BP 150/90 | HR 78 | Temp 99.1°F | Resp 18 | Wt 210.0 lb

## 2018-09-04 DIAGNOSIS — M545 Low back pain, unspecified: Secondary | ICD-10-CM

## 2018-09-04 MED ORDER — TIZANIDINE HCL 4 MG PO TABS: 4.0000 mg | ORAL_TABLET | Freq: Three times a day (TID) | ORAL | 0 refills | Status: AC | PRN

## 2018-09-04 MED ORDER — TIZANIDINE HCL 4 MG PO CAPS: 4.0000 mg | ORAL_CAPSULE | Freq: Three times a day (TID) | ORAL | 0 refills | Status: DC | PRN

## 2018-09-04 MED FILL — tiZANidine HCL 4 MG TABS: 4 | 10 days supply | Qty: 30 | Fill #0

## 2018-09-04 MED FILL — POTASSIUM CHL ER M10 TABLET: 10 | 30 days supply | Qty: 30 | Fill #1

## 2018-09-04 NOTE — Patient Instructions (Addendum)
Acute Back Pain, Adult  -Take medication as prescribed.  -Tylenol extra strength 500 mg, 1 to 2 tablets every 6-8 hours as needed for low back pain.  Avoid ibuprofen as instructed by your PCP.  -Perform stretching exercises provided.  Perform exercise to the point of resistance and then stop.  Each day you will need to push yourself a little further to help relieve your current symptoms.  -Apply ice to the affected areas for the next 48 hours, then switch to warm moist heat.  -Continue your normal routine as much as possible.  Avoid laying around or decreasing physical activity as can be tolerated.  -Warm Epsom salt soaks to help with pain or discomfort.  -Avoid any heavy lifting, straining, or activity that may increase your paint at this time.    -Follow-up in the emergency department if you develop numbness, tingling, or weakness in your lower extremities, or loss of bowel or bladder function.  -Follow-up with your PCP on Friday as scheduled.    Acute back pain is sudden and usually short-lived. It is often caused by an injury to the muscles and tissues in the back. The injury may result from:  A muscle or ligament getting overstretched or torn (strained). Ligaments are tissues that connect bones to each other. Lifting something improperly can cause a back strain.  Wear and tear (degeneration) of the spinal disks. Spinal disks are circular tissue that provides cushioning between the bones of the spine (vertebrae).  Twisting motions, such as while playing sports or doing yard work.  A hit to the back.  Arthritis. You may have a physical exam, lab tests, and imaging tests to find the cause of your pain. Acute back pain usually goes away with rest and home care. Follow these instructions at home: Managing pain, stiffness, and swelling  Take over-the-counter and prescription medicines only as told by your health care provider.  Your health care provider may recommend applying ice  during the first 24-48 hours after your pain starts. To do this: ? Put ice in a plastic bag. ? Place a towel between your skin and the bag. ? Leave the ice on for 20 minutes, 2-3 times a day.  If directed, apply heat to the affected area as often as told by your health care provider. Use the heat source that your health care provider recommends, such as a moist heat pack or a heating pad. ? Place a towel between your skin and the heat source. ? Leave the heat on for 20-30 minutes. ? Remove the heat if your skin turns bright red. This is especially important if you are unable to feel pain, heat, or cold. You have a greater risk of getting burned. Activity   Do not stay in bed. Staying in bed for more than 1-2 days can delay your recovery.  Sit up and stand up straight. Avoid leaning forward when you sit, or hunching over when you stand. ? If you work at a desk, sit close to it so you do not need to lean over. Keep your chin tucked in. Keep your neck drawn back, and keep your elbows bent at a right angle. Your arms should look like the letter "L." ? Sit high and close to the steering wheel when you drive. Add lower back (lumbar) support to your car seat, if needed.  Take short walks on even surfaces as soon as you are able. Try to increase the length of time you walk each day.  Do  not sit, drive, or stand in one place for more than 30 minutes at a time. Sitting or standing for long periods of time can put stress on your back.  Do not drive or use heavy machinery while taking prescription pain medicine.  Use proper lifting techniques. When you bend and lift, use positions that put less stress on your back: ? [place]ia[place]end your knees. ? Keep the load close to your body. ? Avoid twisting.  Exercise regularly as told by your health care provider. Exercising helps your back heal faster and helps prevent back injuries by keeping muscles strong and flexible.  Work with a physical therapist to make a  safe exercise program, as recommended by your health care provider. Do any exercises as told by your physical therapist. Lifestyle  Maintain a healthy weight. Extra weight puts stress on your back and makes it difficult to have good posture.  Avoid activities or situations that make you feel anxious or stressed. Stress and anxiety increase muscle tension and can make back pain worse. Learn ways to manage anxiety and stress, such as through exercise. General instructions  Sleep on a firm mattress in a comfortable position. Try lying on your side with your knees slightly bent. If you lie on your back, put a pillow under your knees.  Follow your treatment plan as told by your health care provider. This may include: ? Cognitive or behavioral therapy. ? Acupuncture or massage therapy. ? Meditation or yoga. Contact a health care provider if:  You have pain that is not relieved with rest or medicine.  You have increasing pain going down into your legs or buttocks.  Your pain does not improve after 2 weeks.  You have pain at night.  You lose weight without trying.  You have a fever or chills. Get help right away if:  You develop new bowel or bladder control problems.  You have unusual weakness or numbness in your arms or legs.  You develop nausea or vomiting.  You develop abdominal pain.  You feel faint. Summary  Acute back pain is sudden and usually short-lived.  Use proper lifting techniques. When you bend and lift, use positions that put less stress on your back.  Take over-the-counter and prescription medicines and apply heat or ice as directed by your health care provider. This information is not intended to replace advice given to you by your health care provider. Make sure you discuss any questions you have with your health care provider. Document Released: 02/06/2005 Document Revised: 05/28/2018 Document Reviewed: 09/20/2016 Elsevier Patient Education  2020 [employer]  Back Exercises The following exercises strengthen the muscles that help to support the trunk and back. They also help to keep the lower back flexible. Doing these exercises can help to prevent back pain or lessen existing pain.  If you have back pain or discomfort, try doing these exercises 2-3 times each day or as told by your health care provider.  As your pain improves, do them once each day, but increase the number of times that you repeat the steps for each exercise (do more repetitions).  To prevent the recurrence of back pain, continue to do these exercises once each day or as told by your health care provider. Do exercises exactly as told by your health care provider and adjust them as directed. It is normal to feel mild stretching, pulling, tightness, or discomfort as you do these exercises, but you should stop right away if you feel sudden pain  or your pain gets worse. Exercises Single knee to chest Repeat these steps 3-5 times for each leg: 1. Lie on your back on a firm bed or the floor with your legs extended. 2. Bring one knee to your chest. Your other leg should stay extended and in contact with the floor. 3. Hold your knee in place by grabbing your knee or thigh with both hands and hold. 4. Pull on your knee until you feel a gentle stretch in your lower back or buttocks. 5. Hold the stretch for 10-30 seconds. 6. Slowly release and straighten your leg. Pelvic tilt Repeat these steps 5-10 times: 1. Lie on your back on a firm bed or the floor with your legs extended. 2. Bend your knees so they are pointing toward the ceiling and your feet are flat on the floor. 3. Tighten your lower abdominal muscles to press your lower back against the floor. This motion will tilt your pelvis so your tailbone points up toward the ceiling instead of pointing to your feet or the floor. 4. With gentle tension and even breathing, hold this position for 5-10 seconds. Cat-cow Repeat these  steps until your lower back becomes more flexible: 1. Get into a hands-and-knees position on a firm surface. Keep your hands under your shoulders, and keep your knees under your hips. You may place padding under your knees for comfort. 2. Let your head hang down toward your chest. Contract your abdominal muscles and point your tailbone toward the floor so your lower back becomes rounded like the back of a cat. 3. Hold this position for 5 seconds. 4. Slowly lift your head, let your abdominal muscles relax and point your tailbone up toward the ceiling so your back forms a sagging arch like the back of a cow. 5. Hold this position for 5 seconds.  Press-ups Repeat these steps 5-10 times: 1. Lie on your abdomen (face-down) on the floor. 2. Place your palms near your head, about shoulder-width apart. 3. Keeping your back as relaxed as possible and keeping your hips on the floor, slowly straighten your arms to raise the top half of your body and lift your shoulders. Do not use your back muscles to raise your upper torso. You may adjust the placement of your hands to make yourself more comfortable. 4. Hold this position for 5 seconds while you keep your back relaxed. 5. Slowly return to lying flat on the floor.  Bridges Repeat these steps 10 times: 1. Lie on your back on a firm surface. 2. Bend your knees so they are pointing toward the ceiling and your feet are flat on the floor. Your arms should be flat at your sides, next to your body. 3. Tighten your buttocks muscles and lift your buttocks off the floor until your waist is at almost the same height as your knees. You should feel the muscles working in your buttocks and the back of your thighs. If you do not feel these muscles, slide your feet 1-2 inches farther away from your buttocks. 4. Hold this position for 3-5 seconds. 5. Slowly lower your hips to the starting position, and allow your buttocks muscles to relax completely. If this exercise is  too easy, try doing it with your arms crossed over your chest. Abdominal crunches Repeat these steps 5-10 times: 1. Lie on your back on a firm bed or the floor with your legs extended. 2. Bend your knees so they are pointing toward the ceiling and your feet are flat  on the floor. 3. Cross your arms over your chest. 4. Tip your chin slightly toward your chest without bending your neck. 5. Tighten your abdominal muscles and slowly raise your trunk (torso) high enough to lift your shoulder blades a tiny bit off the floor. Avoid raising your torso higher than that because it can put too much stress on your low back and does not help to strengthen your abdominal muscles. 6. Slowly return to your starting position. Back lifts Repeat these steps 5-10 times: 1. Lie on your abdomen (face-down) with your arms at your sides, and rest your forehead on the floor. 2. Tighten the muscles in your legs and your buttocks. 3. Slowly lift your chest off the floor while you keep your hips pressed to the floor. Keep the back of your head in line with the curve in your back. Your eyes should be looking at the floor. 4. Hold this position for 3-5 seconds. 5. Slowly return to your starting position. Contact a health care provider if:  Your back pain or discomfort gets much worse when you do an exercise.  Your worsening back pain or discomfort does not lessen within 2 hours after you exercise. If you have any of these problems, stop doing these exercises right away. Do not do them again unless your health care provider says that you can. Get help right away if:  You develop sudden, severe back pain. If this happens, stop doing the exercises right away. Do not do them again unless your health care provider says that you can. This information is not intended to replace advice given to you by your health care provider. Make sure you discuss any questions you have with your health care provider. Document Released:  03/16/2004 Document Revised: 06/13/2018 Document Reviewed: 11/08/2017 Elsevier Patient Education  2020 [employer]

## 2018-09-04 NOTE — Assessment & Plan Note (Signed)
Back pain: Left-sided back pain, this is the reason why the patient is here.  It resolved since yesterday after she took a muscle relaxant and to ibuprofen 800 mg. Recommend a heating pad, avoid ibuprofen, Tylenol and call if not better HTN urgency: BP recheck by me 220/110, she is asx She is supposed to be taking losartan 100 mg, carvedilol 12.5 twice a day, amlodipine 10 mg daily, HCTZ 12.5 mg 1 tablet daily, potassium 10 mEq daily. We called her pharmacy to check the actual dispensation of meds: Carvedilol: She got 60 tablets on 07/17/2018 HCTZ she got 30 tablets on 05/02/2018 Losartan: she got #30 tab 05/02/2018. The patient has hypertensive urgency, I suspect due to poor compliance, recent Motrin intake, increase salt intake. I again explained the patient the gravity of the situation, risk of death or long-term disability from a stroke, aortic dissection, CAD etc. discussed with the patient in terms that she can understand.  She verbalized understanding. I asked her that if she cannot afford her medications there are resources in town that could help her. She said that is not an issue Plan: Good compliance with medication, RTC 3 days, refer to cardiology to see if they have any suggestions. UTI?  Wait for UA urine culture for treatment RTC 3 days

## 2018-09-04 NOTE — Progress Notes (Signed)
Subjective:    Sara Norris is a 53 y.o. female who presents for evaluation of low back pain. The patient has had no prior back problems. Symptoms have been present for 3 days and are gradually worsening.  Onset was related to / precipitated by lifting a heavy object and and playing in the floor with her grandson, patient informs she lifted heavy boxes first, then played with her grandson. The pain is located in the left lumbar area and does not radiate. The pain is described as sharp and occurs all day. She rates her pain as a 10 on a scale of 0-10. Symptoms are exacerbated by going from sitting to standing, lying flat, left lateral bending. Symptoms are improved by muscle relaxants and NSAIDs. She has also tried nothing which provided no symptom relief. She has no other symptoms associated with the back pain. The patient denies fever, trauma, weight loss,  loss of bowel or bladder function, leg or muscle weakness, or numbness.  Patient further denies urinary symptoms to include hesitancy, urgency, frequency, hematuria, flank pain, or dysuria at this time.  The patient states when she was at her PCPs office, her urine was checked, and she is now awaiting the results for urine culture.  Patient states at that time she did have some urinary frequency, which has improved at this appointment..  Patient also denies previous history of kidney stones.  The patient was seen by her PCP 1 day ago for her back pain.  When the patient arrived at her PCPs office, her blood pressure was elevated, and at that time her back pain was improved as she had taken muscle relaxers that she had gotten from a friend and ibuprofen.  The patient was treated for her hypertensive episode and is instructed to return to her PCPs office on Friday, 09/06/2018, for follow-up.  The patient has no "red flag" history indicative of complicated back pain.The patient denies any past medical history of cancer, previous history of bone infection,  abscess, or disc problems  The following portions of the patient's history were reviewed and updated as appropriate: allergies, current medications and past medical history.  Review of Systems Constitutional: negative Ears, nose, mouth, throat, and face: negative Respiratory: negative Cardiovascular: negative Gastrointestinal: negative Genitourinary:positive for frequency, negative for dysuria, hematuria, hesitancy and urinary incontinence Musculoskeletal:positive for back pain, negative for muscle weakness, myalgias, neck pain and stiff joints Neurological: negative    Objective: Blood pressure (!) 150/90, pulse 78, temperature 99.1 F (37.3 C), temperature source Oral, resp. rate 18, weight 210 lb (95.3 kg), SpO2 98 %.   Physical Exam Vitals signs reviewed.  Constitutional:      Comments: Appears uncomfortable with certain positions while sitting on exam table  HENT:     Head: Normocephalic.     Right Ear: Ear canal normal.     Left Ear: Ear canal normal.     Mouth/Throat:     Mouth: Mucous membranes are moist.  Eyes:     Conjunctiva/sclera: Conjunctivae normal.     Pupils: Pupils are equal, round, and reactive to light.  Cardiovascular:     Rate and Rhythm: Normal rate and regular rhythm.     Pulses: Normal pulses.     Heart sounds: Normal heart sounds.  Pulmonary:     Effort: Pulmonary effort is normal. No respiratory distress.     Breath sounds: Normal breath sounds. No wheezing, rhonchi or rales.  Abdominal:     General: Bowel sounds are normal.  Palpations: Abdomen is soft.     Tenderness: There is no abdominal tenderness. There is no right CVA tenderness or left CVA tenderness.  Musculoskeletal:     Lumbar back: She exhibits decreased range of motion (pain with left lateral bending and twisting to left side), tenderness (point tenderness to left lumbar spine) and spasm. She exhibits no swelling and no deformity.       Back:     Right lower leg: No edema.      Left lower leg: No edema.     Comments: Left-low back point tenderness. No crepitus or deformity. Gait is normal, coordination normal. FROM to back, pain with left lateral bending and twisting to left side, no pain with flexion and extension, no pain to left hip with abduction, no pain with adduction. Left straight leg raise to 45 degrees, +2 DP, PT pulses to left foot. FROM to RLE with no pain or tenderness. +2 DP, PT pulses to right foot. Normal leg strength bilaterally, bilateral lower extremities are warm to touch, light touch sensation intact bilaterally. Patient with difficulty going from lying to sitting and sitting to standing positions during exam.  Skin:    Capillary Refill: Capillary refill takes less than 2 seconds.  Neurological:     Mental Status: She is alert.     Cranial Nerves: No cranial nerve deficit.     Motor: No weakness.     Gait: Gait normal.  Psychiatric:        Mood and Affect: Mood normal.        Thought Content: Thought content normal.     Assessment:    Acute Low Back Pain    Plan:  Exam findings, diagnosis etiology and medication use and indications reviewed with patient. Follow- Up and discharge instructions provided. No emergent/urgent issues found on exam.  Based on the patient's clinical presentation, symptoms, and physical assessment, her findings are consistent with that of left-sided low back pain. There is no cause for concern as there were no red flags indicated in her exam to include trauma, fever, incontinence, weight loss, or cancer. I do not expect that this is a genitourinary issue to include pyelonephritis or kidney stone based on the patient's physical exam, but will have the patient follow up with her PCP for her urine culture results, no CVA tenderness on exam. Patient denied loss of bowel or bladder function along with leg weakness.  Patient's gait was also normal and coordinated.  Informed patient that she should remain active and try to avoid  becoming sedentary.  Also provided the patient with stretching exercises.  Informed patient that she will need to follow up with her PCP for her blood pressure and continued back pain on Friday at her scheduled appointment.   Patient education was provided. Patient verbalized understanding of information provided and agrees with plan of care (POC), all questions answered. The patient is advised to call or return to clinic if conditiondoes not see an improvement in symptoms, or to seek the care of the closest emergency department if conditionworsens with the above plan.   1. Acute left-sided low back pain, unspecified whether sciatica present  - tiZANidine (ZANAFLEX) 4 MG tablet; Take 1 tablet (4 mg total) by mouth every 8 (eight) hours as needed for up to 10 days for muscle spasms.  Dispense: 30 tablet; Refill: 0 -Tylenol extra strength 500 mg tablets 1-2 tabs every 6-8 hours as needed for pain.   -Perform stretching exercises that have been provided.  As discussed, do not remain sedentary, trying to complete fully all of the exercises that have been provided. -Apply ice to the affected area for 48 hours then switch to warm, moist heat. -Avoid heavy lifting, straining, or activity that may increase pain at this time. -Warm salt water soaks to help with pain or discomfort.   -Follow-up in the emergency department if you develop numbness, tingling, or weakness in your lower extremities, or loss of bowel or bladder function. -Follow-up with your PCP on Friday for your blood pressure and back pain (if needed) as scheduled.

## 2018-09-05 LAB — URINE CULTURE
MICRO NUMBER:: 664776
SPECIMEN QUALITY:: ADEQUATE

## 2018-09-05 MED ORDER — NITROFURANTOIN MONOHYD MACRO 100 MG PO CAPS: 100.0000 mg | ORAL_CAPSULE | Freq: Two times a day (BID) | ORAL | 0 refills | Status: DC

## 2018-09-05 MED FILL — NITROFURANTOIN MONO-MCR 100: 100 | 5 days supply | Qty: 10 | Fill #0

## 2018-09-05 NOTE — Addendum Note (Signed)
Addended byDamita Dunnings D on: 09/05/2018 03:58 PM   Modules accepted: Orders

## 2018-09-06 ENCOUNTER — Ambulatory Visit: Payer: 59 | Admitting: Internal Medicine

## 2018-09-06 ENCOUNTER — Other Ambulatory Visit: Payer: Self-pay

## 2018-09-06 ENCOUNTER — Encounter: Payer: Self-pay | Admitting: Internal Medicine

## 2018-09-06 VITALS — BP 156/82 | HR 81 | Temp 98.8°F | Resp 16 | Ht 66.0 in | Wt 210.0 lb

## 2018-09-06 DIAGNOSIS — I10 Essential (primary) hypertension: Secondary | ICD-10-CM | POA: Diagnosis not present

## 2018-09-06 DIAGNOSIS — N390 Urinary tract infection, site not specified: Secondary | ICD-10-CM

## 2018-09-06 NOTE — Progress Notes (Signed)
Subjective:    Patient ID: Sara Norris, female    DOB: 02-28-1965, 53 y.o.   MRN: [id number]  DOS:  09/06/2018 Type of visit - description: Follow-up HTN: We called her  Pharmacy today,  she picked up her medication. Also, was seen 2 days ago at another facility due to  back pain, BP at the time was 150/90 Urine culture show an infection, Rx Macrobid.  Review of Systems Denies headache, no  chest pain no difficulty breathing  Past Medical History:  Diagnosis Date  . Allergy    seasonal  . Chronic knee pain   . Colonic diverticular abscess   . Contraception    menopausal  . Diverticulosis   . Hiatal hernia    per pt, not aware of this.  . Hypertension    dx age 53  . Menopause    LMP age 53  . Umbilical hernia    per pt/ nopt aware of this.    Past Surgical History:  Procedure Laterality Date  . CHOLECYSTECTOMY    . MYOMECTOMY     removed fibroids/ when she was in her 30"s  . TONSILLECTOMY      Social History   Socioeconomic History  . Marital status: Single    Spouse name: Not on file  . Number of children: 1  . Years of education: Not on file  . Highest education level: Not on file  Occupational History  . Occupation: Doctor, general practiceenviromental services     Employer: [employer]  Social Needs  . Financial resource strain: Not on file  . Food insecurity    Worry: Not on file    Inability: Not on file  . Transportation needs    Medical: Not on file    Non-medical: Not on file  Tobacco Use  . Smoking status: Never Smoker  . Smokeless tobacco: Never Used  Substance and Sexual Activity  . Alcohol use: Yes    Comment: SOCIAL   . Drug use: No    Comment: no marijuana ;lately   . Sexual activity: Yes    Partners: Male    Comment: 1ST INTERCOURSE- 1116, PARTNERS- 5  Lifestyle  . Physical activity    Days per week: Not on file    Minutes per session: Not on file  . Stress: Not on file  Relationships  . Social Musicianconnections    Talks on phone: Not on  file    Gets together: Not on file    Attends religious service: Not on file    Active member of club or organization: Not on file    Attends meetings of clubs or organizations: Not on file    Relationship status: Not on file  . Intimate partner violence    Fear of current or ex partner: Not on file    Emotionally abused: Not on file    Physically abused: Not on file    Forced sexual activity: Not on file  Other Topics Concern  . 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   Citrus Rash   Codeine Rash   Pollen Extract    sneezing      Medication List       Accurate as of September 06, 2018 11:26 AM. If you have any questions, ask your nurse or doctor.        amLODipine 10 MG tablet Commonly known as: NORVASC Take 1 tablet (10 mg total) by mouth daily.   aspirin EC 81 MG tablet Take 81 mg by mouth daily.   carvedilol 12.5 MG tablet Commonly known as: COREG Take 1 tablet (12.5 mg total) by mouth 2 (two) times daily with a meal.   EPINEPHrine 0.3 mg/0.3 mL Soaj injection Commonly known as: EPI-PEN Inject 0.3 mg into the muscle once.   hydrochlorothiazide 12.5 MG tablet Commonly known as: HYDRODIURIL Take 1 tablet (12.5 mg total) by mouth daily.   losartan 100 MG tablet Commonly known as: COZAAR Take 1 tablet (100 mg total) by mouth daily.   nitrofurantoin (macrocrystal-monohydrate) 100 MG capsule Commonly known as: Macrobid Take 1 capsule (100 mg total) by mouth 2 (two) times daily.   potassium chloride 10 MEQ tablet Commonly known as: K-DUR Take 1 tablet (10 mEq total) by mouth daily.   tiZANidine 4 MG tablet Commonly known as: Zanaflex Take 1 tablet (4 mg total) by mouth every 8 (eight) hours as needed for up to 10 days for muscle spasms.   Vitamin D3 10 MCG (400 UNIT) tablet Take 400 Units by mouth  daily.   zolpidem 10 MG tablet Commonly known as: AMBIEN Take 1 tablet (10 mg total) by mouth at bedtime as needed. for sleep           Objective:   Physical Exam BP (!) 156/82 (BP Location: Left Arm, Patient Position: Sitting, Cuff Size: Normal)   Pulse 81   Temp 98.8 F (37.1 C) (Oral)   Resp 16   Ht 5\' 6"  (1.676 m)   Wt 210 lb (95.3 kg)   SpO2 98%   BMI 33.89 kg/m   General:   Well developed, NAD, BMI noted. HEENT:  Normocephalic . Face symmetric, atraumatic Lungs:  CTA B Normal respiratory effort, no intercostal retractions, no accessory muscle use. Heart: RRR,  no murmur.  No pretibial edema bilaterally  Skin: Not pale. Not jaundice Neurologic:  alert & oriented X3.  Speech normal, gait appropriate for age and unassisted Psych--  Cognition and judgment appear intact.  Cooperative with normal attention span and concentration.  Behavior appropriate. No anxious or depressed appearing.      Assessment      Assessment  Prediabetes: A1c 6.1 08-2014 HTN DX age 69 Insomnia - on ambien Early menopause - declines HRT H/o Knee pain Diverticulitis, 1st episode 02-2015, + perf, conservative Rx  History of food allergies, citrus?. Has an EpiPen  PLAN: HTN: BP much improved, likely because compliance. Continue the same medication, monitor BPs daily, for now I will be satisfied if the blood pressure is less than 150.  Reassess goals when she comes back. UTI: See last visit, urine culture positive, on Macrobid.  Urinary frequency decreasing. Back pain: Went to another facility, prescribed a muscle relaxant, feels better.  Not taking NSAIDs which is good for her blood pressure. Patient education: We again talk about the importance of keeping BP under control, risk of early death, stroke and long-term disability discussed RTC 6 weeks

## 2018-09-06 NOTE — Patient Instructions (Addendum)
  GO TO THE FRONT DESK Schedule your next appointment for a check up in 6 weeks      Check the  blood pressure daily Be sure your blood pressure is between 115/65 and  155/85. If it is consistently higher or lower, let me know

## 2018-09-06 NOTE — Progress Notes (Signed)
Pre visit review using our clinic review tool, if applicable. No additional management support is needed unless otherwise documented below in the visit note. 

## 2018-09-07 NOTE — Assessment & Plan Note (Signed)
HTN: BP much improved, likely because compliance. Continue the same medication, monitor BPs daily, for now I will be satisfied if the blood pressure is less than 150.  Reassess goals when she comes back. UTI: See last visit, urine culture positive, on Macrobid.  Urinary frequency decreasing. Back pain: Went to another facility, prescribed a muscle relaxant, feels better.  Not taking NSAIDs which is good for her blood pressure. Patient education: We again talk about the importance of keeping BP under control, risk of early death, stroke and long-term disability discussed RTC 6 weeks

## 2018-09-18 MED FILL — ZOLPIDEM TARTRATE 10 MG TAB: 10 | 30 days supply | Qty: 30 | Fill #1

## 2018-09-23 ENCOUNTER — Telehealth: Payer: Self-pay

## 2018-09-23 DIAGNOSIS — Z0279 Encounter for issue of other medical certificate: Secondary | ICD-10-CM

## 2018-09-23 LAB — NOVEL CORONAVIRUS, NAA: SARS-CoV-2, NAA: POSITIVE

## 2018-09-23 NOTE — Telephone Encounter (Signed)
completed

## 2018-09-23 NOTE — Telephone Encounter (Signed)
Form faxed to [employer] at [phone]. Form sent for scanning.  

## 2018-09-23 NOTE — Telephone Encounter (Signed)
Received fax confirmation

## 2018-09-23 NOTE — Telephone Encounter (Signed)
FMLA paperwork received and placed in PCP red folder for completion.

## 2018-10-07 ENCOUNTER — Ambulatory Visit (INDEPENDENT_AMBULATORY_CARE_PROVIDER_SITE_OTHER): Payer: 59 | Admitting: Internal Medicine

## 2018-10-07 ENCOUNTER — Encounter: Payer: Self-pay | Admitting: Internal Medicine

## 2018-10-07 DIAGNOSIS — R197 Diarrhea, unspecified: Secondary | ICD-10-CM | POA: Diagnosis not present

## 2018-10-07 DIAGNOSIS — U071 COVID-19: Secondary | ICD-10-CM | POA: Diagnosis not present

## 2018-10-07 DIAGNOSIS — I10 Essential (primary) hypertension: Secondary | ICD-10-CM | POA: Diagnosis not present

## 2018-10-07 DIAGNOSIS — Z7189 Other specified counseling: Secondary | ICD-10-CM

## 2018-10-07 DIAGNOSIS — R5383 Other fatigue: Secondary | ICD-10-CM | POA: Diagnosis not present

## 2018-10-07 DIAGNOSIS — Z09 Encounter for follow-up examination after completed treatment for conditions other than malignant neoplasm: Secondary | ICD-10-CM | POA: Diagnosis not present

## 2018-10-07 NOTE — Progress Notes (Addendum)
Subjective:    Patient ID: Sara Norris, female    DOB: June 11, 1965, 53 y.o.   MRN: [id number]  DOS:  10/07/2018 Type of visit - description: Virtual Visit via Video Note  I connected with@   by a video enabled telemedicine application and verified that I am speaking with the correct person using two identifiers.   THIS ENCOUNTER IS A VIRTUAL VISIT DUE TO COVID-19 - PATIENT WAS NOT SEEN IN THE OFFICE. PATIENT HAS CONSENTED TO VIRTUAL VISIT / TELEMEDICINE VISIT   Location of patient: home  Location of provider: office  I discussed the limitations of evaluation and management by telemedicine and the availability of in person appointments. The patient expressed understanding and agreed to proceed.  History of Present Illness: Acute Developed nasal congestion on 09/22/2018 On 09/23/2018 a COVID test was performed and it came back positive. On 09/25/2018 developed fatigue and diarrhea. Diarrhea is watery or loose, yellow in color, nonbloody.  No nausea, vomited only one time after she ate a sandwich. Appetite is decreased but she is able to drink fluids.  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 No recent ambulatory BPs but BP was checked with the onset of symptoms and it was okay   Review of Systems Never had fever.  Has some chills at the beginning. No cough, chest pain, shortness of breath, myalgias, rash or headache.  Past Medical History:  Diagnosis Date  . Allergy    seasonal  . Chronic knee pain   . Colonic diverticular abscess   . Contraception    menopausal  . Diverticulosis   . Hiatal hernia    per pt, not aware of this.  . Hypertension    dx age 53  . Menopause    LMP age 53  . Umbilical hernia    per pt/ nopt aware of this.    Past Surgical History:  Procedure Laterality Date  . CHOLECYSTECTOMY    . MYOMECTOMY     removed fibroids/ when she was in her 30"s  .  TONSILLECTOMY      Social History   Socioeconomic History  . Marital status: Single    Spouse name: Not on file  . Number of children: 1  . Years of education: Not on file  . Highest education level: Not on file  Occupational History  . Occupation: Doctor, general practiceenviromental services     Employer: [employer]  Social Needs  . Financial resource strain: Not on file  . Food insecurity    Worry: Not on file    Inability: Not on file  . Transportation needs    Medical: Not on file    Non-medical: Not on file  Tobacco Use  . Smoking status: Never Smoker  . Smokeless tobacco: Never Used  Substance and Sexual Activity  . Alcohol use: Yes    Comment: SOCIAL   . Drug use: No    Comment: no marijuana ;lately   . Sexual activity: Yes    Partners: Male    Comment: 1ST INTERCOURSE- 216, PARTNERS- 5  Lifestyle  . Physical activity    Days per week: Not on file    Minutes per session: Not on file  . Stress: Not on file  Relationships  . Social Musicianconnections    Talks on phone: Not on file    Gets together: Not on file  Attends religious service: Not on file    Active member of club or organization: Not on file    Attends meetings of clubs or organizations: Not on file    Relationship status: Not on file  . Intimate partner violence    Fear of current or ex partner: Not on file    Emotionally abused: Not on file    Physically abused: Not on file    Forced sexual activity: Not on file  Other Topics Concern  . 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   Citrus Rash   Codeine Rash   Pollen Extract    sneezing      Medication List       Accurate as of October 07, 2018 11:59 PM. If you have any questions, ask your nurse or doctor.        amLODipine 10 MG tablet Commonly known as: NORVASC Take 1 tablet (10 mg  total) by mouth daily.   aspirin EC 81 MG tablet Take 81 mg by mouth daily.   carvedilol 12.5 MG tablet Commonly known as: COREG Take 1 tablet (12.5 mg total) by mouth 2 (two) times daily with a meal.   EPINEPHrine 0.3 mg/0.3 mL Soaj injection Commonly known as: EPI-PEN Inject 0.3 mg into the muscle once.   hydrochlorothiazide 12.5 MG tablet Commonly known as: HYDRODIURIL Take 1 tablet (12.5 mg total) by mouth daily.   losartan 100 MG tablet Commonly known as: COZAAR Take 1 tablet (100 mg total) by mouth daily.   nitrofurantoin (macrocrystal-monohydrate) 100 MG capsule Commonly known as: Macrobid Take 1 capsule (100 mg total) by mouth 2 (two) times daily.   potassium chloride 10 MEQ tablet Commonly known as: K-DUR Take 1 tablet (10 mEq total) by mouth daily.   Vitamin D3 10 MCG (400 UNIT) tablet Take 400 Units by mouth daily.   zolpidem 10 MG tablet Commonly known as: AMBIEN Take 1 tablet (10 mg total) by mouth at bedtime as needed. for sleep           Objective:   Physical Exam There were no vitals taken for this visit. This is a virtual video visit, she is alert oriented x3, no apparent distress.  Speaking in complete sentences, no respiratory distress.    Assessment    Assessment  Prediabetes: A1c 6.1 08-2014 HTN DX age 53 Insomnia - on ambien Early menopause - declines HRT H/o Knee pain Diverticulitis, 1st episode 02-2015, + perf, conservative Rx  History of food allergies, citrus?. Has an EpiPen  PLAN: COVID-19: Symptoms started 09/22/2018, test was positive on 09/23/2018, see HPI. She calls today b/c she continue with fatigue.  No fever, shortness of breath, cough. She also has persistent diarrhea (noting  she took antibiotics for a UTI on 08/2018) Plan: Extend her work excuse still 10/13/2018. Come to the office for CMP, CBC, chest x-ray and a stool test.  I do not believe she is contagious at this point per guidelines. We will send a message with a  work note and care instructions  HTN: Reports good med compliance, not checking BPs frequently, again strongly recommend to check blood pressures    I discussed the assessment and treatment plan with the patient. The patient was provided  an opportunity to ask questions and all were answered. The patient agreed with the plan and demonstrated an understanding of the instructions.   The patient was advised to call back or seek an in-person evaluation if the symptoms worsen or if the condition fails to improve as anticipated.

## 2018-10-08 NOTE — Assessment & Plan Note (Addendum)
COVID-19: Symptoms started 09/22/2018, test was positive on 09/23/2018, see HPI. She calls today b/c she continue with fatigue.  No fever, shortness of breath, cough. She also has persistent diarrhea (noting  she took antibiotics for a UTI on 08/2018) Plan: Extend her work excuse still 10/13/2018. Come to the office for CMP, CBC, chest x-ray and a stool test.  I do not believe she is contagious at this point per guidelines. We will send a message with a work note and care instructions  HTN: Reports good med compliance, not checking BPs frequently, again strongly recommend to check blood pressures

## 2018-10-09 ENCOUNTER — Telehealth: Payer: Self-pay | Admitting: Internal Medicine

## 2018-10-09 NOTE — Telephone Encounter (Signed)
-----   Message from Colon Branch, MD sent at 10/07/2018  4:34 PM EDT ----- Regarding: Labs, x-rays.  She is not contagious anymore okay to come to the office Enter orders:CBC, CMP, chest x-ray stools for GI pathogensDX COVID-19, fatigue, diarrheaSherri, please call her and make an appointment for labs for tomorrow

## 2018-10-09 NOTE — Telephone Encounter (Signed)
Pt coming in tomorrow 10/10/18

## 2018-10-10 ENCOUNTER — Other Ambulatory Visit (INDEPENDENT_AMBULATORY_CARE_PROVIDER_SITE_OTHER): Payer: 59

## 2018-10-10 ENCOUNTER — Telehealth: Payer: Self-pay | Admitting: Internal Medicine

## 2018-10-10 ENCOUNTER — Telehealth: Payer: Self-pay | Admitting: *Deleted

## 2018-10-10 ENCOUNTER — Ambulatory Visit (HOSPITAL_BASED_OUTPATIENT_CLINIC_OR_DEPARTMENT_OTHER)
Admission: RE | Admit: 2018-10-10 | Discharge: 2018-10-10 | Disposition: A | Discharge status: Home / Self Care | Payer: 59 | Source: Ambulatory Visit | Attending: Internal Medicine | Admitting: Internal Medicine

## 2018-10-10 ENCOUNTER — Other Ambulatory Visit: Payer: Self-pay

## 2018-10-10 DIAGNOSIS — R5383 Other fatigue: Secondary | ICD-10-CM | POA: Diagnosis not present

## 2018-10-10 DIAGNOSIS — U071 COVID-19: Secondary | ICD-10-CM | POA: Insufficient documentation

## 2018-10-10 LAB — CBC WITH DIFFERENTIAL/PLATELET
Basophils Absolute: 0.1 10*3/uL (ref 0.0–0.1)
Basophils Relative: 2 % (ref 0.0–3.0)
Eosinophils Absolute: 0.2 10*3/uL (ref 0.0–0.7)
Eosinophils Relative: 4.4 % (ref 0.0–5.0)
HCT: 36.1 % (ref 36.0–46.0)
Hemoglobin: 12 g/dL (ref 12.0–15.0)
Lymphocytes Relative: 40 % (ref 12.0–46.0)
Lymphs Abs: 1.9 10*3/uL (ref 0.7–4.0)
MCHC: 33.3 g/dL (ref 30.0–36.0)
MCV: 90.1 fl (ref 78.0–100.0)
Monocytes Absolute: 0.3 10*3/uL (ref 0.1–1.0)
Monocytes Relative: 7 % (ref 3.0–12.0)
Neutro Abs: 2.3 10*3/uL (ref 1.4–7.7)
Neutrophils Relative %: 46.6 % (ref 43.0–77.0)
Platelets: 653 10*3/uL — ABNORMAL HIGH (ref 150.0–400.0)
RBC: 4.01 Mil/uL (ref 3.87–5.11)
RDW: 13.8 % (ref 11.5–15.5)
WBC: 4.9 10*3/uL (ref 4.0–10.5)

## 2018-10-10 LAB — COMPREHENSIVE METABOLIC PANEL
ALT: 26 U/L (ref 0–35)
AST: 13 U/L (ref 0–37)
Albumin: 4.1 g/dL (ref 3.5–5.2)
Alkaline Phosphatase: 75 U/L (ref 39–117)
BUN: 14 mg/dL (ref 6–23)
CO2: 29 mEq/L (ref 19–32)
Calcium: 9.7 mg/dL (ref 8.4–10.5)
Chloride: 102 mEq/L (ref 96–112)
Creatinine, Ser: 0.84 mg/dL (ref 0.40–1.20)
GFR: 85.8 mL/min (ref >60.00)
Glucose, Bld: 118 mg/dL — ABNORMAL HIGH (ref 70–99)
Potassium: 4.3 mEq/L (ref 3.5–5.1)
Sodium: 140 mEq/L (ref 135–145)
Total Bilirubin: 0.5 mg/dL (ref 0.2–1.2)
Total Protein: 7 g/dL (ref 6.0–8.3)

## 2018-10-10 MED ORDER — AZITHROMYCIN 250 MG PO TABS: ORAL_TABLET | ORAL | 0 refills | Status: DC

## 2018-10-10 MED FILL — AZITHROMYCIN 250 MG TABS: 250 | 5 days supply | Qty: 6 | Fill #0

## 2018-10-10 NOTE — Telephone Encounter (Signed)
PCP has discussed with Pt already.

## 2018-10-10 NOTE — Telephone Encounter (Signed)
Labs okay except for slightly increased platelets, chest x-ray showed patchy infiltrate consistent with pneumonia. Spoke with the patient, denies fever chills, no chest pain no difficulty breathing.  No cough. Her main symptoms were diarrhea and fatigue, that is getting better Plan: Continue resting, Zithromax

## 2018-10-10 NOTE — Telephone Encounter (Signed)
Sara Norris, PRA from [facility] called to report xray results, " Areas of patchy ground-glass type opacity on the right, felt to represent multifocal pneumonia. Lungs elsewhere clear. No adenopathy"; read by Dr Lowella Grip; results repeated for accuracy, the pt sees Dr Larose Kells, [facility]; notified [place] of results.

## 2018-10-11 NOTE — Telephone Encounter (Signed)
Addendum: Spoke with pulmonary regards the findings and the fact that she still fatigued and having diarrhea.  She is a still at risk of getting very sick.  Also, she might be still  contagious. Plan: Check O2 sats to 3 times a day, be sure they are above 94%, if they are in the 90 or lower needs to go to the ER.  States that she does have a pulse oximeter available. Follow-up 10/18/2018 as already scheduled Finish antibiotics Do not go to work next week.  She visits her mother sometimes and again I told her she may be contagious and needs to be extremely careful.

## 2018-10-18 ENCOUNTER — Other Ambulatory Visit: Payer: Self-pay

## 2018-10-18 ENCOUNTER — Telehealth: Payer: Self-pay | Admitting: Internal Medicine

## 2018-10-18 ENCOUNTER — Ambulatory Visit (INDEPENDENT_AMBULATORY_CARE_PROVIDER_SITE_OTHER): Payer: 59 | Admitting: Internal Medicine

## 2018-10-18 DIAGNOSIS — U071 COVID-19: Secondary | ICD-10-CM | POA: Diagnosis not present

## 2018-10-18 DIAGNOSIS — J1289 Other viral pneumonia: Secondary | ICD-10-CM

## 2018-10-18 DIAGNOSIS — I10 Essential (primary) hypertension: Secondary | ICD-10-CM | POA: Diagnosis not present

## 2018-10-18 DIAGNOSIS — J189 Pneumonia, unspecified organism: Secondary | ICD-10-CM

## 2018-10-18 NOTE — Telephone Encounter (Signed)
Return in about 4 months (around 02/17/2019) for routine visit , 20 minutes, fasting optional. Pt at work will call back on her break to schedule

## 2018-10-18 NOTE — Progress Notes (Signed)
Subjective:    Patient ID: Sara BondsLorrie A Germano, female    DOB: [birth year], 53 y.o.   MRN: [id number]  DOS:  10/18/2018 Type of visit - description: Virtual Visit via Video Note  I connected with@   by a video enabled telemedicine application and verified that I am speaking with the correct person using two identifiers.   THIS ENCOUNTER IS A VIRTUAL VISIT DUE TO COVID-19 - PATIENT WAS NOT SEEN IN THE OFFICE. PATIENT HAS CONSENTED TO VIRTUAL VISIT / TELEMEDICINE VISIT   Location of patient: home  Location of provider: office  I discussed the limitations of evaluation and management by telemedicine and the availability of in person appointments. The patient expressed understanding and agreed to proceed.  History of Present Illness: Follow-up Since the last visit was diagnosed with cough and pneumonia, finish Zithromax.  Gradually better, currently feeling great and she is back to work. Reports her O2 sats are checked from time to time and it has been normal.    Review of Systems Denies nausea, vomiting, diarrhea No cough no shortness of breath.  Past Medical History:  Diagnosis Date  . Allergy    seasonal  . Chronic knee pain   . Colonic diverticular abscess   . Contraception    menopausal  . Diverticulosis   . Hiatal hernia    per pt, not aware of this.  . Hypertension    dx age 53  . Menopause    LMP age 53  . Umbilical hernia    per pt/ nopt aware of this.    Past Surgical History:  Procedure Laterality Date  . CHOLECYSTECTOMY    . MYOMECTOMY     removed fibroids/ when she was in her 30"s  . TONSILLECTOMY      Social History   Socioeconomic History  . Marital status: Single    Spouse name: Not on file  . Number of children: 1  . Years of education: Not on file  . Highest education level: Not on file  Occupational History  . Occupation: Doctor, general practiceenviromental services     Employer: [employer]  Social Needs  . Financial resource strain: Not on file  .  Food insecurity    Worry: Not on file    Inability: Not on file  . Transportation needs    Medical: Not on file    Non-medical: Not on file  Tobacco Use  . Smoking status: Never Smoker  . Smokeless tobacco: Never Used  Substance and Sexual Activity  . Alcohol use: Yes    Comment: SOCIAL   . Drug use: No    Comment: no marijuana ;lately   . Sexual activity: Yes    Partners: Male    Comment: 1ST INTERCOURSE- 3416, PARTNERS- 5  Lifestyle  . Physical activity    Days per week: Not on file    Minutes per session: Not on file  . Stress: Not on file  Relationships  . Social Musicianconnections    Talks on phone: Not on file    Gets together: Not on file    Attends religious service: Not on file    Active member of club or organization: Not on file    Attends meetings of clubs or organizations: Not on file    Relationship status: Not on file  . Intimate partner violence    Fear of current or ex partner: Not on file    Emotionally abused: Not on file    Physically abused: Not  on file    Forced sexual activity: Not on file  Other Topics Concern  . 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   Citrus Rash   Codeine Rash   Pollen Extract    sneezing      Medication List       Accurate as of October 18, 2018 11:59 PM. If you have any questions, ask your nurse or doctor.        STOP taking these medications   azithromycin 250 MG tablet Commonly known as: Zithromax Z-Pak Stopped by: Kathlene November, MD   nitrofurantoin (macrocrystal-monohydrate) 100 MG capsule Commonly known as: Macrobid Stopped by: Kathlene November, MD     TAKE these medications   amLODipine 10 MG tablet Commonly known as: NORVASC Take 1 tablet (10 mg total) by mouth daily.   aspirin EC 81 MG tablet Take 81 mg by mouth daily.   carvedilol 12.5 MG tablet  Commonly known as: COREG Take 1 tablet (12.5 mg total) by mouth 2 (two) times daily with a meal.   EPINEPHrine 0.3 mg/0.3 mL Soaj injection Commonly known as: EPI-PEN Inject 0.3 mg into the muscle once.   hydrochlorothiazide 12.5 MG tablet Commonly known as: HYDRODIURIL Take 1 tablet (12.5 mg total) by mouth daily.   losartan 100 MG tablet Commonly known as: COZAAR Take 1 tablet (100 mg total) by mouth daily.   potassium chloride 10 MEQ tablet Commonly known as: K-DUR Take 1 tablet (10 mEq total) by mouth daily.   Vitamin D3 10 MCG (400 UNIT) tablet Take 400 Units by mouth daily.   zolpidem 10 MG tablet Commonly known as: AMBIEN Take 1 tablet (10 mg total) by mouth at bedtime as needed. for sleep           Objective:   Physical Exam There were no vitals taken for this visit. This is a virtual video visit, she is alert oriented x3 in no distress.    Assessment     Assessment  Prediabetes: A1c 6.1 08-2014 HTN DX age 87 Insomnia - on ambien Early menopause - declines HRT H/o Knee pain Diverticulitis, 1st episode 02-2015, + perf, conservative Rx  History of food allergies, citrus?. Has an EpiPen  PLAN: COVID-19 pneumonia: Status post Zithromax, feeling great.  Recheck a chest x-ray next week [id number] good compliance with amlodipine, carvedilol, losartan, HCTZ and potassium.  Recommend to monitor BPs, call if not at goal Preventive care: We will get a flu shot at work RTC for a routine follow-up by 01/2019, will call and schedule    I discussed the assessment and treatment plan with the patient. The patient was provided an opportunity to ask questions and all were answered. The patient agreed with the plan and demonstrated an understanding of the instructions.   The patient was advised to call back or seek an in-person evaluation if the symptoms worsen or if the condition fails to improve as anticipated.

## 2018-10-19 NOTE — Assessment & Plan Note (Signed)
COVID-19 pneumonia: Status post Zithromax, feeling great.  Recheck a chest x-ray next week [id number] good compliance with amlodipine, carvedilol, losartan, HCTZ and potassium.  Recommend to monitor BPs, call if not at goal Preventive care: We will get a flu shot at work RTC for a routine follow-up by 01/2019, will call and schedule

## 2018-10-24 ENCOUNTER — Other Ambulatory Visit: Payer: Self-pay

## 2018-10-24 ENCOUNTER — Ambulatory Visit (INDEPENDENT_AMBULATORY_CARE_PROVIDER_SITE_OTHER)
Admission: RE | Admit: 2018-10-24 | Discharge: 2018-10-24 | Disposition: A | Discharge status: Home / Self Care | Payer: 59 | Source: Ambulatory Visit | Attending: Internal Medicine | Admitting: Internal Medicine

## 2018-10-24 DIAGNOSIS — J189 Pneumonia, unspecified organism: Secondary | ICD-10-CM | POA: Diagnosis not present

## 2018-10-24 DIAGNOSIS — U071 COVID-19: Secondary | ICD-10-CM

## 2018-11-01 MED FILL — ZOLPIDEM TARTRATE 10 MG TAB: 10 | 30 days supply | Qty: 30 | Fill #2

## 2018-11-20 ENCOUNTER — Encounter: Payer: Self-pay | Admitting: Cardiovascular Disease

## 2018-11-20 ENCOUNTER — Telehealth: Payer: Self-pay

## 2018-11-20 ENCOUNTER — Ambulatory Visit (INDEPENDENT_AMBULATORY_CARE_PROVIDER_SITE_OTHER): Payer: 59 | Admitting: Cardiovascular Disease

## 2018-11-20 ENCOUNTER — Other Ambulatory Visit: Payer: Self-pay

## 2018-11-20 DIAGNOSIS — I10 Essential (primary) hypertension: Secondary | ICD-10-CM | POA: Diagnosis not present

## 2018-11-20 MED ORDER — HYDRALAZINE HCL 25 MG PO TABS: 25.0000 mg | ORAL_TABLET | Freq: Three times a day (TID) | ORAL | 3 refills | Status: DC

## 2018-11-20 MED ORDER — BLOOD PRESSURE CUFF MISC: 1.0000 | Freq: Every day | 0 refills | Status: AC

## 2018-11-20 MED ORDER — BLOOD PRESSURE CUFF MISC: 1.0000 | Freq: Every day | 0 refills | Status: DC

## 2018-11-20 MED FILL — hydrALAZINE HCL 25 MG TABS: 25 | 90 days supply | Qty: 270 | Fill #0

## 2018-11-20 NOTE — Assessment & Plan Note (Signed)
History of resistant hypertension with blood pressure measured today at 160/100.,  Amlodipine, losartan and hydrochlorothiazide.  Her blood pressures are still not under control.  She does not eat salt.  I am going to add hydralazine 25 mg p.o. 3 times daily and get renal Doppler studies.  I have asked her to obtain a home blood pressure cuff and keep a daily blood pressure log for the next 30 days.  Cyril Mourning will call to review and make adjustments in her medications.  I will see her back in 3 months.

## 2018-11-20 NOTE — Patient Instructions (Addendum)
Medication Instructions:  Your physician has recommended you make the following change in your medication:   STAR HYDRALAZINE 25 MG [place]  If you need a refill on your cardiac medications before your next appointment, please call your pharmacy.   Lab work: NONE If you have labs (blood work) drawn today and your tests are completely normal, you will receive your results only by: Marland Kitchen MyChart Message (if you have MyChart) OR . A paper copy in the mail If you have any lab test that is abnormal or we need to change your treatment, we will call you to review the results.  Testing/Procedures: Your physician has requested that you have a renal artery duplex. During this test, an ultrasound is used to evaluate blood flow to the kidneys. Allow one hour for this exam. Do not eat after midnight the day before and avoid carbonated beverages. Take your medications as you usually do. TO BE SCHEDULED   Follow-Up: At [facility], you and your health needs are our priority.  As part of our continuing mission to provide you with exceptional heart care, we have created designated Provider Care Teams.  These Care Teams include your primary Cardiologist (physician) and Advanced Practice Providers (APPs -  Physician Assistants and Nurse Practitioners) who all work together to provide you with the care you need, when you need it. . You will need a follow up appointment in 3 months with Dr. Quay Burow.  Please call our office 2 months in advance to schedule this/each appointment.     Any Other Special Instructions Will Be Listed Below (If Applicable). KEEP A BLOOD PRESSURE LOG FOR 30 DAYS THEN FOLLOW UP WITH A CLINICAL PHARMACIST IN THE HYPERTENSION CLINIC. YOU WILL NEED AN APPOINTMENT.  PICK UP AN OMRON BLOOD PRESSURE CUFF FROM

## 2018-11-20 NOTE — Progress Notes (Signed)
11/20/2018 Sara Norris   1965-07-26  [id number]  Primary Physician Colon Branch, MD Primary Cardiologist: Lorretta Harp MD Lupe Carney, [place]  HPI:  Sara Norris is a 53 y.o. mildly overweight divorced/widowed African-American female mother of 1 son, grandmother of 1 grandchild referred by Dr. Larose Kells for cardiovascular valuation because of resistant hypertension.  She works in the Water engineer at [employer].  Risk factors only include hypertension.  There is no family history for heart disease.  She is never had a heart attack or stroke.  She denies chest pain or shortness of breath.  She had hypertension for the last 20 years.  She is on 4 antihypertensive medications and does avoid salt.   Current Meds  Medication Sig  . amLODipine (NORVASC) 10 MG tablet Take 1 tablet (10 mg total) by mouth daily.  Marland Kitchen aspirin EC 81 MG tablet Take 81 mg by mouth daily.  . carvedilol (COREG) 12.5 MG tablet Take 1 tablet (12.5 mg total) by mouth 2 (two) times daily with a meal.  . Cholecalciferol (VITAMIN D3) 400 units tablet Take 400 Units by mouth daily.  Marland Kitchen EPINEPHrine 0.3 mg/0.3 mL IJ SOAJ injection Inject 0.3 mg into the muscle once.  . hydrochlorothiazide (HYDRODIURIL) 12.5 MG tablet Take 1 tablet (12.5 mg total) by mouth daily.  Marland Kitchen losartan (COZAAR) 100 MG tablet Take 1 tablet (100 mg total) by mouth daily.  . potassium chloride (K-DUR) 10 MEQ tablet Take 1 tablet (10 mEq total) by mouth daily.  Marland Kitchen zolpidem (AMBIEN) 10 MG tablet Take 1 tablet (10 mg total) by mouth at bedtime as needed. for sleep     Allergies  Allergen Reactions  . Soy Allergy     Itching of throat.  . Citrus Rash  . Codeine Rash  . Pollen Extract     sneezing    Social History   Socioeconomic History  . Marital status: Single    Spouse name: Not on file  . Number of children: 1  . Years of education: Not on file  . Highest education level: Not on file  Occupational History  .  Occupation: Fish farm manager: [employer]  Social Needs  . Financial resource strain: Not on file  . Food insecurity    Worry: Not on file    Inability: Not on file  . Transportation needs    Medical: Not on file    Non-medical: Not on file  Tobacco Use  . Smoking status: Never Smoker  . Smokeless tobacco: Never Used  Substance and Sexual Activity  . Alcohol use: Yes    Comment: SOCIAL   . Drug use: No    Comment: no marijuana ;lately   . Sexual activity: Yes    Partners: Male    Comment: 1ST INTERCOURSE- 77, PARTNERS- 5  Lifestyle  . Physical activity    Days per week: Not on file    Minutes per session: Not on file  . Stress: Not on file  Relationships  . Social Herbalist on phone: Not on file    Gets together: Not on file    Attends religious service: Not on file    Active member of club or organization: Not on file    Attends meetings of clubs or organizations: Not on file    Relationship status: Not on file  . Intimate partner violence    Fear of current or  ex partner: Not on file    Emotionally abused: Not on file    Physically abused: Not on file    Forced sexual activity: Not on file  Other Topics Concern  . 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  Cardiovascular: negative for chest pain, dyspnea on exertion, edema, orthopnea, palpitations, paroxysmal nocturnal dyspnea or shortness of breath Dermatological: negative for rash Respiratory: negative for cough or wheezing Urologic: negative for hematuria Abdominal: negative for nausea, vomiting, diarrhea, bright red blood per rectum, melena, or hematemesis Neurologic: negative for visual changes, syncope, or dizziness All other systems reviewed and are otherwise negative except as noted above.     Blood pressure (!) 160/100, pulse 82, temperature 97.7 F (36.5 C), height 5\' 8"  (1.727 m), weight 205 lb (93 kg).  General appearance: alert and no distress Neck: no adenopathy, no carotid bruit, no JVD, supple, symmetrical, trachea midline and thyroid not enlarged, symmetric, no tenderness/mass/nodules Lungs: clear to auscultation bilaterally Heart: regular rate and rhythm, S1, S2 normal, no murmur, click, rub or gallop Extremities: extremities normal, atraumatic, no cyanosis or edema Pulses: 2+ and symmetric Skin: Skin color, texture, turgor normal. No rashes or lesions Neurologic: Alert and oriented X 3, normal strength and tone. Normal symmetric reflexes. Normal coordination and gait  EKG sinus rhythm 82 with evidence of LVH with repolarization changes.  I personally reviewed this EKG.  ASSESSMENT AND PLAN:   Essential hypertension History of resistant hypertension with blood pressure measured today at 160/100.,  Amlodipine, losartan and hydrochlorothiazide.  Her blood pressures are still not under control.  She does not eat salt.  I am going to add hydralazine 25 mg p.o. 3 times daily and get renal Doppler studies.  I have asked her to obtain a home blood pressure cuff and keep a daily blood pressure log for the next 30 days.  will call to review and make adjustments in her medications.  I will see her back in 3 months.      Baxter Hire MD FACP,FACC,FAHA, [facility] 11/20/2018 4:34 PM

## 2018-12-02 ENCOUNTER — Ambulatory Visit (HOSPITAL_COMMUNITY): Payer: 59

## 2018-12-13 ENCOUNTER — Telehealth: Payer: Self-pay | Admitting: Internal Medicine

## 2018-12-13 MED FILL — ZOLPIDEM TARTRATE 10 MG TAB: 10 | 30 days supply | Qty: 30 | Fill #0

## 2018-12-13 NOTE — Telephone Encounter (Signed)
Ambien refill.   Last OV: 10/18/2018 Last Fill: 07/17/2018 #30 and 1RF Pt sig: 1 tab qhs prn  UDS: Ambien only

## 2018-12-13 NOTE — Telephone Encounter (Signed)
Sent!

## 2018-12-17 ENCOUNTER — Ambulatory Visit (INDEPENDENT_AMBULATORY_CARE_PROVIDER_SITE_OTHER): Payer: 59 | Admitting: Pharmacist

## 2018-12-17 ENCOUNTER — Ambulatory Visit: Payer: 59

## 2018-12-17 ENCOUNTER — Other Ambulatory Visit: Payer: Self-pay

## 2018-12-17 ENCOUNTER — Ambulatory Visit (HOSPITAL_COMMUNITY)
Admission: RE | Admit: 2018-12-17 | Discharge: 2018-12-17 | Disposition: A | Discharge status: Home / Self Care | Payer: 59 | Source: Ambulatory Visit | Attending: Cardiovascular Disease | Admitting: Cardiovascular Disease

## 2018-12-17 VITALS — BP 158/106 | HR 68 | Ht 68.0 in | Wt 208.8 lb

## 2018-12-17 DIAGNOSIS — I10 Essential (primary) hypertension: Secondary | ICD-10-CM | POA: Insufficient documentation

## 2018-12-17 MED ORDER — CHLORTHALIDONE 25 MG PO TABS: 12.5000 mg | ORAL_TABLET | Freq: Every day | ORAL | 1 refills | Status: DC

## 2018-12-17 MED FILL — CHLORTHALIDONE 25 MG TABS: 25 | 60 days supply | Qty: 30 | Fill #0

## 2018-12-17 NOTE — Patient Instructions (Addendum)
Return for a follow up appointment in 4 weeks  Go to the lab in 3 weeks   Check your blood pressure at home daily (if able) and keep record of the readings.  Take your BP meds as follows: DISCONTINUE HCTZ 12.5mg  daily START chlorthalidone 12.5mg  (1/2 tablet) daily  Bring all of your meds, your BP cuff and your record of home blood pressures to your next appointment.  Exercise as you're able, try to walk approximately 30 minutes per day.  Keep salt intake to a minimum, especially watch canned and prepared boxed foods.  Eat more fresh fruits and vegetables and fewer canned items.  Avoid eating in fast food restaurants.    HOW TO TAKE YOUR BLOOD PRESSURE: . Rest 5 minutes before taking your blood pressure. .  Don't smoke or drink caffeinated beverages for at least 30 minutes before. . Take your blood pressure before (not after) you eat. . Sit comfortably with your back supported and both feet on the floor (don't cross your legs). . Elevate your arm to heart level on a table or a desk. . Use the proper sized cuff. It should fit smoothly and snugly around your bare upper arm. There should be enough room to slip a fingertip under the cuff. The bottom edge of the cuff should be 1 inch above the crease of the elbow. . Ideally, take 3 measurements at one sitting and record the average.   ] DASH Eating Plan DASH stands for "Dietary Approaches to Stop Hypertension." The DASH eating plan is a healthy eating plan that has been shown to reduce high blood pressure (hypertension). It may also reduce your risk for type 2 diabetes, heart disease, and stroke. The DASH eating plan may also help with weight loss. What are tips for following this plan?  General guidelines  Avoid eating more than 2,300 mg (milligrams) of salt (sodium) a day. If you have hypertension, you may need to reduce your sodium intake to 1,500 mg a day.  Limit alcohol intake to no more than 1 drink a day for nonpregnant women and 2  drinks a day for men. One drink equals 12 oz of beer, 5 oz of wine, or 1 oz of hard liquor.  Work with your health care provider to maintain a healthy body weight or to lose weight. Ask what an ideal weight is for you.  Get at least 30 minutes of exercise that causes your heart to beat faster (aerobic exercise) most days of the week. Activities may include walking, swimming, or biking.  Work with your health care provider or diet and nutrition specialist (dietitian) to adjust your eating plan to your individual calorie needs. Reading food labels   Check food labels for the amount of sodium per serving. Choose foods with less than 5 percent of the Daily Value of sodium. Generally, foods with less than 300 mg of sodium per serving fit into this eating plan.  To find whole grains, look for the word "whole" as the first word in the ingredient list. Shopping  Buy products labeled as "low-sodium" or "no salt added."  Buy fresh foods. Avoid canned foods and premade or frozen meals. Cooking  Avoid adding salt when cooking. Use salt-free seasonings or herbs instead of table salt or sea salt. Check with your health care provider or pharmacist before using salt substitutes.  Do not fry foods. Cook foods using healthy methods such as baking, boiling, grilling, and broiling instead.  Cook with heart-healthy oils, such as  olive, canola, soybean, or sunflower oil. Meal planning  Eat a balanced diet that includes: ? 5 or more servings of fruits and vegetables each day. At each meal, try to fill half of your plate with fruits and vegetables. ? Up to 6-8 servings of whole grains each day. ? Less than 6 oz of lean meat, poultry, or fish each day. A 3-oz serving of meat is about the same size as a deck of cards. One egg equals 1 oz. ? 2 servings of low-fat dairy each day. ? A serving of nuts, seeds, or beans 5 times each week. ? Heart-healthy fats. Healthy fats called Omega-3 fatty acids are found in  foods such as flaxseeds and coldwater fish, like sardines, salmon, and mackerel.  Limit how much you eat of the following: ? Canned or prepackaged foods. ? Food that is high in trans fat, such as fried foods. ? Food that is high in saturated fat, such as fatty meat. ? Sweets, desserts, sugary drinks, and other foods with added sugar. ? Full-fat dairy products.  Do not salt foods before eating.  Try to eat at least 2 vegetarian meals each week.  Eat more home-cooked food and less restaurant, buffet, and fast food.  When eating at a restaurant, ask that your food be prepared with less salt or no salt, if possible. What foods are recommended? The items listed may not be a complete list. Talk with your dietitian about what dietary choices are best for you. Grains Whole-grain or whole-wheat bread. Whole-grain or whole-wheat pasta. Brown rice. Orpah Cobbatmeal. Quinoa. Bulgur. Whole-grain and low-sodium cereals. Pita bread. Low-fat, low-sodium crackers. Whole-wheat flour tortillas. Vegetables Fresh or frozen vegetables (raw, steamed, roasted, or grilled). Low-sodium or reduced-sodium tomato and vegetable juice. Low-sodium or reduced-sodium tomato sauce and tomato paste. Low-sodium or reduced-sodium canned vegetables. Fruits All fresh, dried, or frozen fruit. Canned fruit in natural juice (without added sugar). Meat and other protein foods Skinless chicken or Malawiturkey. Ground chicken or Mal[place]urkey. Pork with fat trimmed off. Fish and seafood. Egg whites. Dried beans, peas, or lentils. Unsalted nuts, nut butters, and seeds. Unsalted canned beans. Lean cuts of beef with fat trimmed off. Low-sodium, lean deli meat. Dairy Low-fat (1%) or fat-free (skim) milk. Fat-free, low-fat, or reduced-fat cheeses. Nonfat, low-sodium ricotta or cottage cheese. Low-fat or nonfat yogurt. Low-fat, low-sodium cheese. Fats and oils Soft margarine without trans fats. Vegetable oil. Low-fat, reduced-fat, or light mayonnaise and salad  dressings (reduced-sodium). Canola, safflower, olive, soybean, and sunflower oils. Avocado. Seasoning and other foods Herbs. Spices. Seasoning mixes without salt. Unsalted popcorn and pretzels. Fat-free sweets. What foods are not recommended? The items listed may not be a complete list. Talk with your dietitian about what dietary choices are best for you. Grains Baked goods made with fat, such as croissants, muffins, or some breads. Dry pasta or rice meal packs. Vegetables Creamed or fried vegetables. Vegetables in a cheese sauce. Regular canned vegetables (not low-sodium or reduced-sodium). Regular canned tomato sauce and paste (not low-sodium or reduced-sodium). Regular tomato and vegetable juice (not low-sodium or reduced-sodium). Rosita FirePickles. Olives. Fruits Canned fruit in a light or heavy syrup. Fried fruit. Fruit in cream or butter sauce. Meat and other protein foods Fatty cuts of meat. Ribs. Fried meat. Tomasa BlaseBacon. Sausage. Bologna and other processed lunch meats. Salami. Fatback. Hotdogs. Bratwurst. Salted nuts and seeds. Canned beans with added salt. Canned or smoked fish. Whole eggs or egg yolks. Chicken or Malawiturkey with skin. Dairy Whole or 2% milk, cream, and half-and-half.  Whole or full-fat cream cheese. Whole-fat or sweetened yogurt. Full-fat cheese. Nondairy creamers. Whipped toppings. Processed cheese and cheese spreads. Fats and oils Butter. Stick margarine. Lard. Shortening. Ghee. Bacon fat. Tropical oils, such as coconut, palm kernel, or palm oil. Seasoning and other foods Salted popcorn and pretzels. Onion salt, garlic salt, seasoned salt, table salt, and sea salt. Worcestershire sauce. Tartar sauce. Barbecue sauce. Teriyaki sauce. Soy sauce, including reduced-sodium. Steak sauce. Canned and packaged gravies. Fish sauce. Oyster sauce. Cocktail sauce. Horseradish that you find on the shelf. Ketchup. Mustard. Meat flavorings and tenderizers. Bouillon cubes. Hot sauce and Tabasco sauce.  Premade or packaged marinades. Premade or packaged taco seasonings. Relishes. Regular salad dressings. Where to find more information:  National Heart, Lung, and [place]: https://wilson-eaton.com/  American Heart Association: www.heart.org Summary  The DASH eating plan is a healthy eating plan that has been shown to reduce high blood pressure (hypertension). It may also reduce your risk for type 2 diabetes, heart disease, and stroke.  With the DASH eating plan, you should limit salt (sodium) intake to 2,300 mg a day. If you have hypertension, you may need to reduce your sodium intake to 1,500 mg a day.  When on the DASH eating plan, aim to eat more fresh fruits and vegetables, whole grains, lean proteins, low-fat dairy, and heart-healthy fats.  Work with your health care provider or diet and nutrition specialist (dietitian) to adjust your eating plan to your individual calorie needs. This information is not intended to replace advice given to you by your health care provider. Make sure you discuss any questions you have with your health care provider. Document Released: 01/26/2011 Document Revised: 01/19/2017 Document Reviewed: 01/31/2016 Elsevier Patient Education  2020 [employer].

## 2018-12-17 NOTE — Progress Notes (Signed)
Patient ID: ATHEA HALEY                 DOB: [birth year]                      MRN: [id number]     HPI: ROMEKA SCIFRES is a 53 y.o. female referred by Dr. Gwenlyn Found to HTN clinic. PMH includes hypertension, insomnia, and diverticulitis.  Patient is employee at [employer] and reports original diagnoses of HTN in get 39s after her 1st pregnancy. Denies  problems with her current therapy. She has occasional headaches associated with allergies but denies blurry vision or chest pain , blurry vision , or chest pain. Reports occasional headaches usually associated with allergies. She had renal vascula study this morning and didn't took any of her morning BP medications.  Current HTN meds:  amlodipine 10mg  daily Losartan 100mg  daily HCTZ 12.5mg  daily Carvedilol 12.5mg  daily Hydralazine 25mg  (8am monrnig & 1pm & 11pm)  (added by DR Gwenlyn Found on 11/20/2018)  BP goal: 130/80  Family History: HTN in mother, father, grandmother; MI in grandmother; DM in mother , grandfather  Social History: no tobacco, no alcohol (only social)  Diet: 50/50 home cooked meals & popeye's ; baked meat & vegetables, chicken bisquet or muffins, likes sauces in meals.  Exercise: walks 2-3x/week or gym at work (30 minutes)  Home BP readings: no log or records 140/97; 150/97 - per patient history  Wt Readings from Last 3 Encounters:  12/17/18 208 lb 12.8 oz (94.7 kg)  11/20/18 205 lb (93 kg)  09/06/18 210 lb (95.3 kg)   BP Readings from Last 3 Encounters:  12/17/18 (!) 158/106  11/20/18 (!) 160/100  09/06/18 (!) 156/82   Pulse Readings from Last 3 Encounters:  12/17/18 68  11/20/18 82  09/06/18 81     Past Medical History:  Diagnosis Date  . Allergy    seasonal  . Chronic knee pain   . Colonic diverticular abscess   . Contraception    menopausal  . Diverticulosis   . Hiatal hernia    per pt, not aware of this.  . Hypertension    dx age 31  . Menopause    LMP age 72  . Umbilical hernia    per pt/ nopt  aware of this.    Current Outpatient Medications on File Prior to Visit  Medication Sig Dispense Refill  . amLODipine (NORVASC) 10 MG tablet Take 1 tablet (10 mg total) by mouth daily. 30 tablet 6  . aspirin EC 81 MG tablet Take 81 mg by mouth daily.    . Blood Pressure Monitoring (BLOOD PRESSURE CUFF) MISC 1 Product by Does not apply route daily. MONITOR YOUR BLOOD PRESSURE 1-2 TIMES A DAY. 1 each 0  . carvedilol (COREG) 12.5 MG tablet Take 1 tablet (12.5 mg total) by mouth 2 (two) times daily with a meal. 60 tablet 6  . Cholecalciferol (VITAMIN D3) 400 units tablet Take 400 Units by mouth daily.    Marland Kitchen EPINEPHrine 0.3 mg/0.3 mL IJ SOAJ injection Inject 0.3 mg into the muscle once.    . hydrALAZINE (APRESOLINE) 25 MG tablet Take 1 tablet (25 mg total) by mouth 3 (three) times daily. 270 tablet 3  . losartan (COZAAR) 100 MG tablet Take 1 tablet (100 mg total) by mouth daily. 30 tablet 6  . potassium chloride (K-DUR) 10 MEQ tablet Take 1 tablet (10 mEq total) by mouth daily. 30 tablet 6  . zolpidem (AMBIEN)  10 MG tablet TAKE 1 TABLET BY MOUTH AT BEDTIME AS NEEDED FOR SLEEP. 30 tablet 3   No current facility-administered medications on file prior to visit.     Allergies  Allergen Reactions  . Soy Allergy     Itching of throat.  . Citrus Rash  . Codeine Rash  . Pollen Extract     sneezing    Blood pressure (!) 158/106, pulse 68, height 5\' 8"  (1.727 m), weight 208 lb 12.8 oz (94.7 kg), SpO2 96 %.  Essential hypertension Blood pressure remains above goal of 130/80 or less. Patient reports compliance with therapy and low sodium diet, but ingest high amount of "hidden sodium" in take out meals and sauces.  Will work on decreasing sodium in diet (DASH diet example provided), d/c HCTZ, start chlorthalidone, and repeat BMET in 3 weeks. Plan to change losartan for valsartan 160mg  during next office visit if additional BP control needed.   Chancelor Hardrick Rodriguez-Guzman PharmD, BCPS, CPP [facility] [address] [place] [address] 12/18/2018 3:08 PM

## 2018-12-18 ENCOUNTER — Encounter: Payer: Self-pay | Admitting: Pharmacist

## 2018-12-18 NOTE — Assessment & Plan Note (Signed)
Blood pressure remains above goal of 130/80 or less. Patient reports compliance with therapy and low sodium diet, but ingest high amount of "hidden sodium" in take out meals and sauces.  Will work on decreasing sodium in diet (DASH diet example provided), d/c HCTZ, start chlorthalidone, and repeat BMET in 3 weeks. Plan to change losartan for valsartan 160mg  during next office visit if additional BP control needed.

## 2019-01-09 ENCOUNTER — Telehealth: Payer: Self-pay

## 2019-01-09 NOTE — Telephone Encounter (Signed)
Copied from [place] [phone]. Topic: General - Inquiry >> Jan 09, 2019  3:27 PM Mathis Bud wrote: Reason for CRM: Patient is requesting to go to sleep study. Patient is getting up 8 times at night.  Patient does not know when she is asleep or not. Please advise Call back [phone]

## 2019-01-09 NOTE — Telephone Encounter (Signed)
Please advise 

## 2019-01-10 NOTE — Telephone Encounter (Signed)
Recommend office visit to discuss.  Since that she has insomnia, I am not sure about sleep apnea and the need for a sleep study

## 2019-01-10 NOTE — Telephone Encounter (Signed)
Please schedule OV w/ PCP to discuss. TY.

## 2019-01-13 ENCOUNTER — Ambulatory Visit (INDEPENDENT_AMBULATORY_CARE_PROVIDER_SITE_OTHER): Payer: 59 | Admitting: Internal Medicine

## 2019-01-13 ENCOUNTER — Encounter: Payer: Self-pay | Admitting: Internal Medicine

## 2019-01-13 DIAGNOSIS — I10 Essential (primary) hypertension: Secondary | ICD-10-CM

## 2019-01-13 DIAGNOSIS — Z0189 Encounter for other specified special examinations: Secondary | ICD-10-CM

## 2019-01-13 DIAGNOSIS — R0683 Snoring: Secondary | ICD-10-CM

## 2019-01-13 DIAGNOSIS — G4700 Insomnia, unspecified: Secondary | ICD-10-CM | POA: Diagnosis not present

## 2019-01-13 NOTE — Progress Notes (Signed)
Subjective:    Patient ID: Sara BondsLorrie A Stepka, female    DOB: Jul 23, 1965, 89 y.o.   MRN: [id number]  DOS:  01/13/2019 Type of visit - description: Virtual Visit via Video Note  I connected with@   by a video enabled telemedicine application and verified that I am speaking with the correct person using two identifiers.   THIS ENCOUNTER IS A VIRTUAL VISIT DUE TO COVID-19 - PATIENT WAS NOT SEEN IN THE OFFICE. PATIENT HAS CONSENTED TO VIRTUAL VISIT / TELEMEDICINE VISIT   Location of patient: home  Location of provider: office  I discussed the limitations of evaluation and management by telemedicine and the availability of in person appointments. The patient expressed understanding and agreed to proceed.  History of Present Illness: Acute The patient's main concern is difficulty sleeping and snoring. Reports that when she goes to bed take her long time to fall asleep. She wakes up constantly and is tossing and turning. Also reports very loud snoring, this is a new thing for her and she is concerned. 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  Sleep during her private duty is only on and off.    Review of Systems Denies major problems with depression Denies falling asleep inappropriately during the daytime or driving Stress is about the same as before, not feeling particularly anxious or depressed. Lately has developed some nocturia without any LUTS  Past Medical History:  Diagnosis Date  . Allergy    seasonal  . Chronic knee pain   . Colonic diverticular abscess   . Contraception    menopausal  . Diverticulosis   . Hiatal hernia    per pt, not aware of this.  . Hypertension    dx age 53  . Menopause    LMP age 53  . Umbilical hernia    per pt/ nopt aware of this.    Past Surgical History:  Procedure Laterality Date  . CHOLECYSTECTOMY    . MYOMECTOMY     removed fibroids/ when  she was in her 30"s  . TONSILLECTOMY      Social History   Socioeconomic History  . Marital status: Single    Spouse name: Not on file  . Number of children: 1  . Years of education: Not on file  . Highest education level: Not on file  Occupational History  . Occupation: Doctor, general practiceenviromental services     Employer: [employer]  Social Needs  . Financial resource strain: Not on file  . Food insecurity    Worry: Not on file    Inability: Not on file  . Transportation needs    Medical: Not on file    Non-medical: Not on file  Tobacco Use  . Smoking status: Never Smoker  . Smokeless tobacco: Never Used  Substance and Sexual Activity  . Alcohol use: Yes    Comment: SOCIAL   . Drug use: No    Comment: no marijuana ;lately   . Sexual activity: Yes    Partners: Male    Comment: 1ST INTERCOURSE- 2916, PARTNERS- 5  Lifestyle  . Physical activity    Days per week: Not on file    Minutes per session: Not on file  . Stress: Not on file  Relationships  . Social Musicianconnections    Talks on phone: Not on file    Gets together: Not  on file    Attends religious service: Not on file    Active member of club or organization: Not on file    Attends meetings of clubs or organizations: Not on file    Relationship status: Not on file  . Intimate partner violence    Fear of current or ex partner: Not on file    Emotionally abused: Not on file    Physically abused: Not on file    Forced sexual activity: Not on file  Other Topics Concern  . 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   Citrus Rash   Codeine Rash   Pollen Extract    sneezing      Medication List       Accurate as of January 13, 2019 11:59 PM. If you have any questions, ask your nurse or doctor.        amLODipine 10 MG tablet Commonly known as:  NORVASC Take 1 tablet (10 mg total) by mouth daily.   aspirin EC 81 MG tablet Take 81 mg by mouth daily.   Blood Pressure Cuff Misc 1 Product by Does not apply route daily. MONITOR YOUR BLOOD PRESSURE 1-2 TIMES A DAY.   carvedilol 12.5 MG tablet Commonly known as: COREG Take 1 tablet (12.5 mg total) by mouth 2 (two) times daily with a meal.   chlorthalidone 25 MG tablet Commonly known as: HYGROTON Take 0.5 tablets (12.5 mg total) by mouth daily.   EPINEPHrine 0.3 mg/0.3 mL Soaj injection Commonly known as: EPI-PEN Inject 0.3 mg into the muscle once.   hydrALAZINE 25 MG tablet Commonly known as: APRESOLINE Take 1 tablet (25 mg total) by mouth 3 (three) times daily.   losartan 100 MG tablet Commonly known as: COZAAR Take 1 tablet (100 mg total) by mouth daily.   potassium chloride 10 MEQ tablet Commonly known as: KLOR-CON Take 1 tablet (10 mEq total) by mouth daily.   Vitamin D3 10 MCG (400 UNIT) tablet Take 400 Units by mouth daily.   zolpidem 10 MG tablet Commonly known as: AMBIEN TAKE 1 TABLET BY MOUTH AT BEDTIME AS NEEDED FOR SLEEP.           Objective:   Physical Exam There were no vitals taken for this visit. This is a virtual video visit, alert oriented x3, no apparent distress    Assessment      Assessment  Prediabetes: A1c 6.1 08-2014 HTN DX age 27 Insomnia - on ambien Early menopause - declines HRT H/o Knee pain Diverticulitis, 1st episode 02-2015, + perf, conservative Rx  History of food allergies, citrus?. Has an EpiPen  PLAN: Insomnia: Has been taking Ambien for years and up until recently she was doing well.  I reinforced the need to have good sleeping hygiene, we are sending her a message with tips.  Unfortunately, twice a week her schedule is very different, see HPI. Recommend to add melatonin nightly. She is concerned about OSA, see next. Snoring: Quite noticeable according to the patient, new problem for her.  Will refer to further  evaluation. HTN: Reports good ambulatory BPs but could not give me readings, have an appointment to see cardiology tomorrow.  She does take chlorthalidone at bedtime, recommend to change the timing to  early in the morning as that medication may cause nocturia and disturb her sleep. RTC 2 3 months CPX.  Will call and arrange.    I discussed the assessment and treatment plan with the patient. The patient was provided an opportunity to ask questions and all were answered. The patient agreed with the plan and demonstrated an understanding of the instructions.   The patient was advised to call back or seek an in-person evaluation if the symptoms worsen or if the condition fails to improve as anticipated.

## 2019-01-13 NOTE — Progress Notes (Signed)
Patient ID: Sara Norris                 DOB: 1965-04-09                      MRN: [id number]     HPI: Sara Norris is a 53 y.o. female referred by Dr. Allyson Sabal to HTN clinic. PMH includes hypertension, insomnia, and diverticulitis.  Patient is employee at [employer] and reports original diagnoses of HTN in get 30s after her 1st pregnancy. Of note, she has occasional headaches that are related to allergies and she is currently taking a potassium supplement.  At last appt on 12/17/2018 patient's BP appeared to be elevated with an office reading of 158/106 mmHg. Patient's home BP readings ranged from 140-150/90 mmHg per her memory. Patient was noted to ingest a high amount of "hidden sodium" in take out meals and sauces. Patient was instructed to decrease sodium in diet. HCTZ was discontinued and chlorthalidone 25 mg daily was initiated. All other BP meds (amlodipine 10mg  daily, losartan 100mg  daily, carvedilol 12.5mg  daily, and hydralazine 25mg  (8am monrnig & 1pm & 11pm)  were continued.  Patient presents for follow up appointment with HTN clinic. Pt has switched back from chlorthalidone to HCTZ 25 mg daily due to similar efficacy as HCTZ after 1-week trial of chlorthalidone and she stated chlorthalidone costs $10 while HCTZ costs $0. She may forget to take carvedilol 3x/week.  She mentioned she experienced palpitations when she initiated hydralazine, however, the palpitations have resolved. She states she has taken clonidine in the past, which had "good effects" on her blood pressure. She noted that clonidine made her experience excessive somnolence which is why it was discontinued. She is tolerating all other HTN meds. She states she monitors her blood pressure on a regular basis in the morning, but does not record BP readings. She gets her BP checked by a nurse at work daily. She is not experiencing s/sx of hypotension, shortness of breath, chest pain, or headaches. She takes her carvedilol, amlodipine,  and potassium supplement in the morning around 8AM. She takes her carvedilol, HCTZ, and losartan at night around 11PM. She takes hydralazine 8AM, 1PM, and 11PM.   Current HTN meds:  amlodipine 10mg  daily losartan 100mg  daily HCTZ 12.5mg  daily carvedilol 12.5mg  daily hydralazine 25mg  (8am morninig & 1pm & 11pm)  (added by Dr on 11/20/2018)  Prior HTN meds Chlorthalidone (cost - however HCTZ and chlorthalidone are tier 1 agents on insurance plan) Clonidine - excessive somnolence  BP goal: < 130/80 mmHg  Family History: HTN in mother, father, grandmother; MI in grandmother; DM in mother , grandfather  Social History: no tobacco, no alcohol (only social)  Diet: states she has been eating healthier; sometimes popeye's; baked meat & vegetables, chicken bisquet or muffins, likes sauces in meals. -Cabbage, broccoli (trying to eat more green vegetables) -Baked chicken -Goes out to eat 2x per week - still will eat baked chicken and/or fish -Trying to cut out red meat  Exercise: walks 2-3x/week or gym at work (30 minutes)  Home BP readings: no log or records She reports BP readings are ~160/110 mmHg   Wt Readings from Last 3 Encounters:  12/17/18 208 lb 12.8 oz (94.7 kg)  11/20/18 205 lb (93 kg)  09/06/18 210 lb (95.3 kg)   BP Readings from Last 3 Encounters:  12/17/18 (!) 158/106  11/20/18 (!) 160/100  09/06/18 (!) 156/82   Pulse Readings from Last 3  Encounters:  12/17/18 68  11/20/18 82  09/06/18 81    Renal function: CrCl cannot be calculated (Patient's most recent lab result is older than the maximum 21 days allowed.).  Past Medical History:  Diagnosis Date  . Allergy    seasonal  . Chronic knee pain   . Colonic diverticular abscess   . Contraception    menopausal  . Diverticulosis   . Hiatal hernia    per pt, not aware of this.  . Hypertension    dx age 84  . Menopause    LMP age 43  . Umbilical hernia    per pt/ nopt aware of this.    Current  Outpatient Medications on File Prior to Visit  Medication Sig Dispense Refill  . amLODipine (NORVASC) 10 MG tablet Take 1 tablet (10 mg total) by mouth daily. 30 tablet 6  . aspirin EC 81 MG tablet Take 81 mg by mouth daily.    . Blood Pressure Monitoring (BLOOD PRESSURE CUFF) MISC 1 Product by Does not apply route daily. MONITOR YOUR BLOOD PRESSURE 1-2 TIMES A DAY. 1 each 0  . carvedilol (COREG) 12.5 MG tablet Take 1 tablet (12.5 mg total) by mouth 2 (two) times daily with a meal. 60 tablet 6  . chlorthalidone (HYGROTON) 25 MG tablet Take 0.5 tablets (12.5 mg total) by mouth daily. 30 tablet 1  . Cholecalciferol (VITAMIN D3) 400 units tablet Take 400 Units by mouth daily.    Marland Kitchen EPINEPHrine 0.3 mg/0.3 mL IJ SOAJ injection Inject 0.3 mg into the muscle once.    . hydrALAZINE (APRESOLINE) 25 MG tablet Take 1 tablet (25 mg total) by mouth 3 (three) times daily. 270 tablet 3  . losartan (COZAAR) 100 MG tablet Take 1 tablet (100 mg total) by mouth daily. 30 tablet 6  . potassium chloride (K-DUR) 10 MEQ tablet Take 1 tablet (10 mEq total) by mouth daily. 30 tablet 6  . zolpidem (AMBIEN) 10 MG tablet TAKE 1 TABLET BY MOUTH AT BEDTIME AS NEEDED FOR SLEEP. 30 tablet 3   No current facility-administered medications on file prior to visit.     Allergies  Allergen Reactions  . Soy Allergy     Itching of throat.  . Citrus Rash  . Codeine Rash  . Pollen Extract     sneezing     Assessment/Plan:  1. Hypertension - BP goal < 130/80 mmHg; therefore, patient is not at goal. Patient has not had labs drawn since 09/2018. Plan to increase carvedilol from 12.5 mg BID to 25 mg BID (HR stable). Plan to continue amlodipine 10mg  daily, losartan 100mg  daily HCTZ 12.5mg  daily (due to cost), and hydralazine 25mg  (8am morninig & 1pm & 11pm). Stressed the importance of compliance to medications. Plan for patient to use alarm on her phone and put medications on her dresser as a reminder to take medications daily. Plan  for patient to obtain labs tomorrow and if stable plan to change losartan 100 mg daily for valsartan 320 mg during next office visit on 01/30/2019 if blood pressure readings remain elevated. Patient verbalized understanding.   Thank you for involving pharmacy to assist in providing Sara Norris's care.   Drexel Iha, PharmD PGY2 Ambulatory Care Pharmacy Resident

## 2019-01-13 NOTE — Telephone Encounter (Signed)
Appt scheduled

## 2019-01-14 ENCOUNTER — Ambulatory Visit (INDEPENDENT_AMBULATORY_CARE_PROVIDER_SITE_OTHER): Payer: 59 | Admitting: Pharmacist

## 2019-01-14 ENCOUNTER — Other Ambulatory Visit: Payer: Self-pay

## 2019-01-14 VITALS — BP 168/108 | HR 74

## 2019-01-14 DIAGNOSIS — I10 Essential (primary) hypertension: Secondary | ICD-10-CM | POA: Diagnosis not present

## 2019-01-14 MED ORDER — CARVEDILOL 25 MG PO TABS: 25.0000 mg | ORAL_TABLET | Freq: Two times a day (BID) | ORAL | 11 refills | Status: DC

## 2019-01-14 NOTE — Patient Instructions (Addendum)
It was a pleasure seeing you in clinic today Sara Norris!  Today the plan is...  1. INCREASE carvedilol from 12.5 mg BID to 25 mg BID 2. Continue amlodipine 10mg  daily, losartan 100mg  daily, HCTZ 12.5mg  daily, and hydralazine 25mg  (8am monrnig &1pm &11pm)   Please call the PharmD clinicif you have any questions that you would like to speak with a pharmacist about

## 2019-01-15 NOTE — Assessment & Plan Note (Signed)
Insomnia: Has been taking Ambien for years and up until recently she was doing well.  I reinforced the need to have good sleeping hygiene, we are sending her a message with tips.  Unfortunately, twice a week her schedule is very different, see HPI. Recommend to add melatonin nightly. She is concerned about OSA, see next. Snoring: Quite noticeable according to the patient, new problem for her.  Will refer to further evaluation. HTN: Reports good ambulatory BPs but could not give me readings, have an appointment to see cardiology tomorrow.  She does take chlorthalidone at bedtime, recommend to change the timing to early in the morning as that medication may cause nocturia and disturb her sleep. RTC 2 3 months CPX.  Will call and arrange.

## 2019-01-20 ENCOUNTER — Telehealth: Payer: Self-pay | Admitting: Pharmacist

## 2019-01-20 MED FILL — ZOLPIDEM TARTRATE 10 MG TAB: 10 | 30 days supply | Qty: 30 | Fill #1

## 2019-01-20 NOTE — Telephone Encounter (Signed)
Called and notified the pt that labs are due asap. Pt voiced understanding

## 2019-01-20 NOTE — Telephone Encounter (Signed)
Please remind patient to complete blood work today before 4pm or tomorrow.  We need her results to adjust her blood pressure medication.

## 2019-01-21 DIAGNOSIS — I10 Essential (primary) hypertension: Secondary | ICD-10-CM | POA: Diagnosis not present

## 2019-01-22 LAB — BASIC METABOLIC PANEL
BUN/Creatinine Ratio: 21 (ref 9–23)
BUN: 19 mg/dL (ref 6–24)
CO2: 26 mmol/L (ref 20–29)
Calcium: 9.8 mg/dL (ref 8.7–10.2)
Chloride: 103 mmol/L (ref 96–106)
Creatinine, Ser: 0.91 mg/dL (ref 0.57–1.00)
GFR calc Af Amer: 83 mL/min/{1.73_m2} (ref >59)
GFR calc non Af Amer: 72 mL/min/{1.73_m2} (ref >59)
Glucose: 84 mg/dL (ref 65–99)
Potassium: 4.6 mmol/L (ref 3.5–5.2)
Sodium: 142 mmol/L (ref 134–144)

## 2019-01-29 ENCOUNTER — Encounter: Payer: Self-pay | Admitting: Neurology

## 2019-01-29 ENCOUNTER — Other Ambulatory Visit: Payer: Self-pay

## 2019-01-29 ENCOUNTER — Ambulatory Visit (INDEPENDENT_AMBULATORY_CARE_PROVIDER_SITE_OTHER): Payer: 59 | Admitting: Neurology

## 2019-01-29 VITALS — BP 209/115 | HR 74 | Ht 69.0 in | Wt 212.0 lb

## 2019-01-29 DIAGNOSIS — G479 Sleep disorder, unspecified: Secondary | ICD-10-CM

## 2019-01-29 DIAGNOSIS — R0683 Snoring: Secondary | ICD-10-CM | POA: Diagnosis not present

## 2019-01-29 DIAGNOSIS — Z9189 Other specified personal risk factors, not elsewhere classified: Secondary | ICD-10-CM

## 2019-01-29 DIAGNOSIS — R351 Nocturia: Secondary | ICD-10-CM | POA: Diagnosis not present

## 2019-01-29 DIAGNOSIS — R030 Elevated blood-pressure reading, without diagnosis of hypertension: Secondary | ICD-10-CM

## 2019-01-29 DIAGNOSIS — E669 Obesity, unspecified: Secondary | ICD-10-CM

## 2019-01-29 NOTE — Progress Notes (Signed)
Subjective:    Patient ID: Sara Norris is a 53 y.o. female.  HPI     Huston Foley, MD, PhD [facility] [address] [address] [place], [place] [address]  Dear Dr. Drue Novel, I saw your patient, Sara Norris, upon your kind request in my sleep clinic today for initial consultation of her sleep disorder, in particular, concern for underlying obstructive sleep apnea.  The patient is unaccompanied today.  As you know, Sara Norris is a 53 year old right-handed woman with an underlying history of seasonal allergies, diverticulosis, hypertension, chronic knee pain and mild obesity, who reports snoring and excessive daytime somnolence.  I reviewed your virtual visit note from 01/13/2019.  Her Epworth sleepiness score is 6 out of 24, fatigue severity score is 23 out of 63.  She has a very high blood pressure today, did not take her blood pressure medication and reports rushing to this appointment.  We rechecked her blood pressure towards the end of the visit.  She denies any chest pain, shortness of breath, blurry vision or headache. She reports being a restless sleeper and her snoring has been really loud, has become worse over time.  She has gained some weight in the recent past.  She has a family history of loud snoring but nobody has been formally diagnosed with sleep apnea.  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  Generally speaking, her bedtime is between 11 PM and midnight and rise time around 7.  She denies recurrent morning headaches, gasping sensation or palpitations but wakes up frequently to go to the bathroom at night, about 3-5 times on any given night.  She has a TV on at night and it turns off on a timer.  She has no pets in the household, lives alone, her son is on his own, age 7, she has a nearly 55-year-old grandson.  She had a tonsillectomy and adenoidectomy as a child.   She denies telltale symptoms of restless leg syndrome.  She tosses and turns a lot.  She does not wake up rested.  She does not typically nap.  Her Past Medical History Is Significant For: Past Medical History:  Diagnosis Date  . Allergy    seasonal  . Chronic knee pain   . Colonic diverticular abscess   . Contraception    menopausal  . Diverticulosis   . Hiatal hernia    per pt, not aware of this.  . Hypertension    dx age 16  . Menopause    LMP age 45  . Umbilical hernia    per pt/ nopt aware of this.    Her Past Surgical History Is Significant For: Past Surgical History:  Procedure Laterality Date  . CHOLECYSTECTOMY    . MYOMECTOMY     removed fibroids/ when she was in her 30"s  . TONSILLECTOMY      Her Family History Is Significant For: Family History  Problem Relation Age of Onset  . Hypertension Mother   . Diabetes Mother   . Arthritis Mother   . Gout Mother   . Hypertension Father   . Diabetes Father        GP  . Hypertension Other        GP  . CAD Maternal Grandmother   . Stroke Neg Hx   . Colon cancer Neg Hx   . Breast cancer Neg  Hx     Her Social History Is Significant For: Social History   Socioeconomic History  . Marital status: Single    Spouse name: Not on file  . Number of children: 1  . Years of education: Not on file  . Highest education level: Not on file  Occupational History  . Occupation: Doctor, general practiceenviromental services     Employer: [employer]  Social Needs  . Financial resource strain: Not on file  . Food insecurity    Worry: Not on file    Inability: Not on file  . Transportation needs    Medical: Not on file    Non-medical: Not on file  Tobacco Use  . Smoking status: Never Smoker  . Smokeless tobacco: Never Used  Substance and Sexual Activity  . Alcohol use: Yes    Comment: SOCIAL   . Drug use: No    Comment: no marijuana ;lately   . Sexual activity: Yes    Partners: Male    Comment: 1ST INTERCOURSE- 7716,  PARTNERS- 5  Lifestyle  . Physical activity    Days per week: Not on file    Minutes per session: Not on file  . Stress: Not on file  Relationships  . Social Musicianconnections    Talks on phone: Not on file    Gets together: Not on file    Attends religious service: Not on file    Active member of club or organization: Not on file    Attends meetings of clubs or organizations: Not on file    Relationship status: Not on file  Other Topics Concern  . 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 Soy Allergy     Itching of throat.  . Citrus Rash  . Codeine Rash  . Pollen Extract     sneezing  :   Her Current Medications Are:  Outpatient Encounter Medications as of 01/29/2019  Medication Sig  . amLODipine (NORVASC) 10 MG tablet Take 1 tablet (10 mg total) by mouth daily.  Marland Kitchen. aspirin EC 81 MG tablet Take 81 mg by mouth daily.  . Blood Pressure Monitoring (BLOOD PRESSURE CUFF) MISC 1 Product by Does not apply route daily. MONITOR YOUR BLOOD PRESSURE 1-2 TIMES A DAY.  . carvedilol (COREG) 25 MG tablet Take 1 tablet (25 mg total) by mouth 2 (two) times daily.  . chlorthalidone (HYGROTON) 25 MG tablet Take 0.5 tablets (12.5 mg total) by mouth daily.  . Cholecalciferol (VITAMIN D3) 400 units tablet Take 400 Units by mouth daily.  Marland Kitchen. EPINEPHrine 0.3 mg/0.3 mL IJ SOAJ injection Inject 0.3 mg into the muscle once.  . hydrALAZINE (APRESOLINE) 25 MG tablet Take 1 tablet (25 mg total) by mouth 3 (three) times daily.  Marland Kitchen. losartan (COZAAR) 100 MG tablet Take 1 tablet (100 mg total) by mouth daily.  . potassium chloride (K-DUR) 10 MEQ tablet Take 1 tablet (10 mEq total) by mouth daily.  Marland Kitchen. zolpidem (AMBIEN) 10 MG tablet TAKE 1 TABLET BY MOUTH AT BEDTIME AS NEEDED FOR SLEEP.   No facility-administered encounter medications on file as of 01/29/2019.   :  Review of Systems:   Out of a complete 14 point review of systems, all are reviewed and negative with the exception of these symptoms as listed below: Review of Systems  Neurological:  Pt presents today to discuss her sleep. Pt has never had a sleep study but does endorse snoring.  Epworth Sleepiness Scale 0= would never doze 1= slight chance of dozing 2= moderate chance of dozing 3= high chance of dozing  Sitting and reading: 1 Watching TV: 2 Sitting inactive in a public place (ex. Theater or meeting): 0 As a passenger in a car for an hour without a break: 2 Lying down to rest in the afternoon: 0 Sitting and talking to someone: 0 Sitting quietly after lunch (no alcohol): 1 In a car, while stopped in traffic: 0 Total: 6     Objective:  Neurological Exam  Physical Exam Physical Examination:   Vitals:   01/29/19 0859  BP: (!) 234/131  Pulse: 74   She denies any chest pain, shortness of breath, headache or blurry vision, upon recheck of her blood pressure towards the end of the visit: 209/115.  General Examination: The patient is a very pleasant 53 y.o. female in no acute distress. She appears well-developed and well-nourished and well groomed.   HEENT: Normocephalic, atraumatic, pupils are equal, round and reactive to light, extraocular tracking is good without limitation to gaze excursion or nystagmus noted. Hearing is grossly intact. Face is symmetric with normal facial animation. Speech is clear with no dysarthria noted. There is no hypophonia. There is no lip, neck/head, jaw or voice tremor. Neck is supple with full range of passive and active motion. There are no carotid bruits on auscultation. Oropharynx exam reveals: mild mouth dryness, adequate dental hygiene and moderate airway crowding, due to Small airway entry, redundant soft palate, soft thicker soft palate, Mallampati class III, tonsils are absent, wider tongue noted.  Tongue protrudes centrally in palate elevates symmetrically.   Neck circumference is 15-1/2 inches.  She has a mild to moderate overbite.  Chest: Clear to auscultation without wheezing, rhonchi or crackles noted.  Heart: S1+S2+0, regular and normal without murmurs, rubs or gallops noted.   Abdomen: Soft, non-tender and non-distended with normal bowel sounds appreciated on auscultation.  Extremities: There is no pitting edema in the distal lower extremities bilaterally.   Skin: Warm and dry without trophic changes noted.   Musculoskeletal: exam reveals no obvious joint deformities, tenderness or joint swelling or erythema.   Neurologically:  Mental status: The patient is awake, alert and oriented in all 4 spheres. Her immediate and remote memory, attention, language skills and fund of knowledge are appropriate. There is no evidence of aphasia, agnosia, apraxia or anomia. Speech is clear with normal prosody and enunciation. Thought process is linear. Mood is normal and affect is normal.  Cranial nerves II - XII are as described above under HEENT exam.  Motor exam: Normal bulk, strength and tone is noted. There is no tremor, Romberg is negative. Fine motor skills and coordination: grossly intact.  Cerebellar testing: No dysmetria or intention tremor. There is no truncal or gait ataxia.  Sensory exam: intact to light touch in the upper and lower extremities.  Gait, station and balance: She stands easily. No veering to one side is noted. No leaning to one side is noted. Posture is age-appropriate and stance is narrow based. Gait shows normal stride length and normal pace. No problems turning are noted. Tandem walk is unremarkable.                Assessment and Plan:  In summary, Sara Norris is a very pleasant 53 y.o.-year old female with an underlying history of seasonal allergies, diverticulosis,  hypertension, chronic knee pain and mild obesity, whose history and physical exam are concerning for obstructive sleep apnea (OSA). I had a long chat with the  patient about my findings and the diagnosis of OSA, its prognosis and treatment options. We talked about medical treatments, surgical interventions and non-pharmacological approaches. I explained in particular the risks and ramifications of untreated moderate to severe OSA, especially with respect to developing cardiovascular disease down the Road, including congestive heart failure, difficult to treat hypertension, cardiac arrhythmias, or stroke. Even type 2 diabetes has, in part, been linked to untreated OSA. Symptoms of untreated OSA include daytime sleepiness, memory problems, mood irritability and mood disorder such as depression and anxiety, lack of energy, as well as recurrent headaches, especially morning headaches. We talked about trying to maintain a healthy lifestyle in general, as well as the importance of weight control. We also talked about the importance of good sleep hygiene. I recommended the following at this time: sleep study.  I explained the sleep test procedure to the patient and also outlined possible surgical and non-surgical treatment options of OSA, including the use of a custom-made dental device (which would require a referral to a specialist dentist or oral surgeon), upper airway surgical options, such as traditional UPPP or a novel less invasive surgical option in the form of Inspire hypoglossal nerve stimulation (which would involve a referral to an ENT surgeon). I also explained the CPAP treatment option to the patient, who indicated that she would be willing to try CPAP if the need arises. I explained the importance of being compliant with PAP treatment, not only for insurance purposes but primarily to improve Her symptoms, and for the patient's long term health benefit, including to reduce Her cardiovascular risks. I answered all her questions today and the patient was in agreement. I plan to see her back after the sleep study is completed and encouraged her to call with any  interim questions, concerns, problems or updates.   Thank you very much for allowing me to participate in the care of this nice patient. If I can be of any further assistance to you please do not hesitate to call me at [phone].  Sincerely,   Huston Foley, MD, PhD

## 2019-01-29 NOTE — Patient Instructions (Signed)

## 2019-01-30 ENCOUNTER — Telehealth: Payer: Self-pay | Admitting: Pharmacist Clinician (PhC)/ Clinical Pharmacy Specialist

## 2019-01-30 ENCOUNTER — Ambulatory Visit: Payer: 59

## 2019-01-30 NOTE — Progress Notes (Deleted)
Patient ID: Sara Norris                 DOB: 02-Aug-1965                      MRN: [id number]     HPI: Sara Norris is a 53 y.o. female referred by Dr. Gwenlyn Found to HTN clinic. PMH includes hypertension, insomnia, and diverticulitis.  Patient is employee at [employer] and reports original diagnoses of HTN in get 27s after her 1st pregnancy. Denies  problems with her current therapy. She has occasional headaches associated with allergies but denies blurry vision or chest pain , blurry vision , or chest pain. Reports occasional headaches usually associated with allergies. Renal vascula study performed 12/17/2018 was normal. HCTZ was not changed to chlorthalidone as instructed due to cost, and patinet reports poor compliance with carvedilol therapy. BMET repeat on 01/21/2019 shows stable renal function and electrolytes    Current HTN meds:  amlodipine 10mg  daily Losartan 100mg  daily HCTZ 12.5mg  daily - refuses chlorthalidone Carvedilol 25mg  BID Hydralazine 25mg  (8am monrnig & 1pm & 11pm)  (added by DR Gwenlyn Found on 11/20/2018)  BP goal: 130/80  Family History: HTN in mother, father, grandmother; MI in grandmother; DM in mother , grandfather  Social History: no tobacco, no alcohol (only social)  Diet: 50/50 home cooked meals & popeye's ; baked meat & vegetables, chicken bisquet or muffins, likes sauces in meals.  Exercise: walks 2-3x/week or gym at work (30 minutes)  Home BP readings:   Wt Readings from Last 3 Encounters:  01/29/19 212 lb (96.2 kg)  12/17/18 208 lb 12.8 oz (94.7 kg)  11/20/18 205 lb (93 kg)   BP Readings from Last 3 Encounters:  01/29/19 (!) 209/115  01/14/19 (!) 168/108  12/17/18 (!) 158/106   Pulse Readings from Last 3 Encounters:  01/29/19 74  01/14/19 74  12/17/18 68     Past Medical History:  Diagnosis Date  . Allergy    seasonal  . Chronic knee pain   . Colonic diverticular abscess   . Contraception    menopausal  . Diverticulosis   . Hiatal hernia    per pt, not aware of this.  . Hypertension    dx age 55  . Menopause    LMP age 62  . Umbilical hernia    per pt/ nopt aware of this.    Current Outpatient Medications on File Prior to Visit  Medication Sig Dispense Refill  . amLODipine (NORVASC) 10 MG tablet Take 1 tablet (10 mg total) by mouth daily. 30 tablet 6  . aspirin EC 81 MG tablet Take 81 mg by mouth daily.    . Blood Pressure Monitoring (BLOOD PRESSURE CUFF) MISC 1 Product by Does not apply route daily. MONITOR YOUR BLOOD PRESSURE 1-2 TIMES A DAY. 1 each 0  . carvedilol (COREG) 25 MG tablet Take 1 tablet (25 mg total) by mouth 2 (two) times daily. 60 tablet 11  . chlorthalidone (HYGROTON) 25 MG tablet Take 0.5 tablets (12.5 mg total) by mouth daily. 30 tablet 1  . Cholecalciferol (VITAMIN D3) 400 units tablet Take 400 Units by mouth daily.    Marland Kitchen EPINEPHrine 0.3 mg/0.3 mL IJ SOAJ injection Inject 0.3 mg into the muscle once.    . hydrALAZINE (APRESOLINE) 25 MG tablet Take 1 tablet (25 mg total) by mouth 3 (three) times daily. 270 tablet 3  . losartan (COZAAR) 100 MG tablet Take 1 tablet (100 mg  total) by mouth daily. 30 tablet 6  . potassium chloride (K-DUR) 10 MEQ tablet Take 1 tablet (10 mEq total) by mouth daily. 30 tablet 6  . zolpidem (AMBIEN) 10 MG tablet TAKE 1 TABLET BY MOUTH AT BEDTIME AS NEEDED FOR SLEEP. 30 tablet 3   No current facility-administered medications on file prior to visit.    Allergies  Allergen Reactions  . Soy Allergy     Itching of throat.  . Citrus Rash  . Codeine Rash  . Pollen Extract     sneezing    There were no vitals taken for this visit.  No problem-specific Assessment & Plan notes found for this encounter.   Lycia Sachdeva Rodriguez-Guzman PharmD, BCPS, CPP [facility] [address] [place] [address] 01/30/2019 1:17 PM

## 2019-01-30 NOTE — Telephone Encounter (Signed)
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  Notes that she would like to cut carvedilol dose in half, as the 25 mg bid dose causes her to have headaches and palpitations.  Also notes that her BP yesterday was "a little

## 2019-01-31 NOTE — Telephone Encounter (Signed)
(  con't)  " a little elevated" when at neurology appointment yesterday.  BP was recorded at 209/115.  She states that it was the spaghetti and bread she ate the previous day that caused elevation, and she would not be eating them any more.    Patient was strongly advised to make another appointment and watch home BP readings closely.

## 2019-02-24 MED FILL — ZOLPIDEM TARTRATE 10 MG TAB: 10 | 30 days supply | Qty: 30 | Fill #2

## 2019-03-03 ENCOUNTER — Other Ambulatory Visit: Payer: Self-pay

## 2019-03-04 ENCOUNTER — Encounter: Payer: Self-pay | Admitting: Internal Medicine

## 2019-03-04 ENCOUNTER — Ambulatory Visit (INDEPENDENT_AMBULATORY_CARE_PROVIDER_SITE_OTHER): Payer: 59 | Admitting: Internal Medicine

## 2019-03-04 ENCOUNTER — Telehealth: Payer: Self-pay | Admitting: *Deleted

## 2019-03-04 VITALS — BP 192/94 | HR 81 | Temp 97.0°F | Resp 12 | Ht 66.0 in | Wt 215.4 lb

## 2019-03-04 DIAGNOSIS — Z1231 Encounter for screening mammogram for malignant neoplasm of breast: Secondary | ICD-10-CM

## 2019-03-04 DIAGNOSIS — Z0000 Encounter for general adult medical examination without abnormal findings: Secondary | ICD-10-CM | POA: Diagnosis not present

## 2019-03-04 DIAGNOSIS — Z01419 Encounter for gynecological examination (general) (routine) without abnormal findings: Secondary | ICD-10-CM | POA: Diagnosis not present

## 2019-03-04 DIAGNOSIS — I10 Essential (primary) hypertension: Secondary | ICD-10-CM | POA: Diagnosis not present

## 2019-03-04 MED ORDER — CARVEDILOL 25 MG PO TABS: 25.0000 mg | ORAL_TABLET | Freq: Two times a day (BID) | ORAL | 2 refills | Status: DC

## 2019-03-04 MED ORDER — LOSARTAN POTASSIUM 100 MG PO TABS: 100.0000 mg | ORAL_TABLET | Freq: Every day | ORAL | 2 refills | Status: DC

## 2019-03-04 MED ORDER — POTASSIUM CHLORIDE ER 10 MEQ PO TBCR: 10.0000 meq | EXTENDED_RELEASE_TABLET | Freq: Every day | ORAL | 2 refills | Status: DC

## 2019-03-04 MED ORDER — CHLORTHALIDONE 25 MG PO TABS: 12.5000 mg | ORAL_TABLET | Freq: Every day | ORAL | 2 refills | Status: DC

## 2019-03-04 MED ORDER — HYDRALAZINE HCL 25 MG PO TABS: 25.0000 mg | ORAL_TABLET | Freq: Three times a day (TID) | ORAL | 2 refills | Status: DC

## 2019-03-04 MED FILL — CHLORTHALIDONE 25 MG TABS: 25 | 60 days supply | Qty: 30 | Fill #0

## 2019-03-04 MED FILL — hydrALAZINE HCL 25 MG TABS: 25 | 30 days supply | Qty: 90 | Fill #0

## 2019-03-04 MED FILL — LOSARTAN POTASSIUM 100 MG T: 100 | 30 days supply | Qty: 30 | Fill #0

## 2019-03-04 MED FILL — POTASSIUM CHLORIDE CRYS ER: 10 | 30 days supply | Qty: 30 | Fill #0

## 2019-03-04 MED FILL — CARVEDILOL 25 MG TABLET: 25 | 30 days supply | Qty: 60 | Fill #0

## 2019-03-04 NOTE — Telephone Encounter (Signed)
Spoke with pharmacy about medication and when they were filled.  Amlodipine filled last on 7/14 30 day supply Coreg last filled 11/24 30 day supply Hydralazine last filled 9/30 90 day supply Losartan last filled 7/14 30 day supply Potassium last filled 7/15 30 day supply  Spoke with patient and she did not ever deny that she was taking her medications.  She stated that for the coreg that she had some 12.5mg  that she was finishing up. But she never explained about the other medications.  Explained to patient of how important it is that she takes her medications and what harm it could cause her.  Advised also that we would not be able to continue to complete her FMLA paperwork due to her being non compliant with her medications to get her condition under control. Patient stated that she will pick up medications tomorrow.

## 2019-03-04 NOTE — Patient Instructions (Addendum)
  GO TO THE FRONT DESK Schedule your next appointment   for a checkup with me in 1 week, fasting  Your blood pressure today is 220/110  Take every single medication as prescribed to you, continue watching your salt intake  Call or go to the ER if you have chest pain, headache, nausea, vomiting.

## 2019-03-04 NOTE — Progress Notes (Signed)
Subjective:    Patient ID: Sara Norris, female    DOB: October 04, 1965, 54 y.o.   MRN: [id number]  DOS:  03/04/2019 Type of visit - description: cpx In addition to the CPX, we discussed her blood pressure.  It is very high today.  BP Readings from Last 3 Encounters:  03/04/19 (!) 192/94  01/29/19 (!) 209/115  01/14/19 (!) 168/108     Wt Readings from Last 3 Encounters:  03/04/19 215 lb 6.4 oz (97.7 kg)  01/29/19 212 lb (96.2 kg)  12/17/18 208 lb 12.8 oz (94.7 kg)     Review of Systems She specifically denies chest pain, difficulty breathing, lower extremity edema, chest pain or palpitations today.  Other than above, a 14 point review of systems is negative    Past Medical History:  Diagnosis Date  . Allergy    seasonal  . Chronic knee pain   . Colonic diverticular abscess   . Contraception    menopausal  . Diverticulosis   . Hiatal hernia    per pt, not aware of this.  . Hypertension    dx age 57  . Menopause    LMP age 44  . Umbilical hernia    per pt/ nopt aware of this.    Past Surgical History:  Procedure Laterality Date  . CHOLECYSTECTOMY    . MYOMECTOMY     removed fibroids/ when she was in her 30"s  . TONSILLECTOMY      Social History   Socioeconomic History  . Marital status: Single    Spouse name: Not on file  . Number of children: 1  . Years of education: Not on file  . Highest education level: Not on file  Occupational History  . Occupation: Fish farm manager: [employer]  Tobacco Use  . Smoking status: Never Smoker  . Smokeless tobacco: Never Used  Substance and Sexual Activity  . Alcohol use: Yes    Comment: SOCIAL   . Drug use: No    Comment: no marijuana ;lately   . Sexual activity: Yes    Partners: Male    Comment: 1ST INTERCOURSE- 29, PARTNERS- 5  Other Topics Concern  . 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 Difficulty of Paying Living Expenses: Not on file  Food Insecurity:   . Worried About Charity fundraiser in the Last Year: Not on file  . Ran Out of Food in the Last Year: Not on file  Transportation Needs:   . Lack of Transportation (Medical): Not on file  . Lack of Transportation (Non-Medical): Not on file  Physical Activity:   . Days of Exercise per Week: Not on file  . Minutes of Exercise per Session: Not on file  Stress:   . Feeling of Stress : Not on file  Social Connections:   . Frequency of Communication with Friends and Family: Not on file  . Frequency of Social Gatherings with Friends and Family: Not on file  . Attends Religious Services: Not on file  . Active Member of Clubs or Organizations: Not on file  . Attends Archivist Meetings: Not on file  . Marital Status: Not on file  Intimate Partner Violence:   . Fear of Current or Ex-Partner: Not on file  .  Emotionally Abused: Not on file  . Physically Abused: Not on file  . Sexually Abused: Not on file     Family History  Problem Relation Age of Onset  . Hypertension Mother   . Diabetes Mother   . Arthritis Mother   . Gout Mother   . Hypertension Father   . Diabetes Father        GP  . Hypertension Other        GP  . CAD Maternal Grandmother   . Stroke Neg Hx   . Colon cancer Neg Hx   . Breast cancer Neg Hx      Allergies as of 03/04/2019      Reactions   Soy Allergy    Itching of throat.   Citrus Rash   Codeine Rash   Pollen Extract    sneezing      Medication List       Accurate as of March 04, 2019 11:59 PM. If you have any questions, ask your nurse or doctor.        amLODipine 10 MG tablet Commonly known as: NORVASC Take 1 tablet (10 mg total) by mouth daily.   aspirin EC 81 MG tablet Take 81 mg by mouth daily.   Blood Pressure Cuff Misc 1 Product by Does not apply route daily. MONITOR YOUR BLOOD PRESSURE  1-2 TIMES A DAY.   carvedilol 25 MG tablet Commonly known as: COREG Take 1 tablet (25 mg total) by mouth 2 (two) times daily. What changed: how much to take   chlorthalidone 25 MG tablet Commonly known as: HYGROTON Take 0.5 tablets (12.5 mg total) by mouth daily.   EPINEPHrine 0.3 mg/0.3 mL Soaj injection Commonly known as: EPI-PEN Inject 0.3 mg into the muscle once.   hydrALAZINE 25 MG tablet Commonly known as: APRESOLINE Take 1 tablet (25 mg total) by mouth 3 (three) times daily.   losartan 100 MG tablet Commonly known as: COZAAR Take 1 tablet (100 mg total) by mouth daily.   potassium chloride 10 MEQ tablet Commonly known as: KLOR-CON Take 1 tablet (10 mEq total) by mouth daily.   Vitamin D3 10 MCG (400 UNIT) tablet Take 400 Units by mouth daily.   zolpidem 10 MG tablet Commonly known as: AMBIEN TAKE 1 TABLET BY MOUTH AT BEDTIME AS NEEDED FOR SLEEP.           Objective:   Physical Exam BP (!) 192/94 (BP Location: Right Arm, Cuff Size: Large)   Pulse 81   Temp (!) 97 F (36.1 C) (Temporal)   Resp 12   Ht 5\' 6"  (1.676 m)   Wt 215 lb 6.4 oz (97.7 kg)   SpO2 100%   BMI 34.77 kg/m  General: Well developed, NAD, BMI noted Neck: No  thyromegaly  HEENT:  Normocephalic . Face symmetric, atraumatic Lungs:  CTA B Normal respiratory effort, no intercostal retractions, no accessory muscle use. Heart: RRR,  no murmur.  No pretibial edema bilaterally  Abdomen:  Not distended, soft, non-tender. No rebound or rigidity.   Skin: Exposed areas without rash. Not pale. Not jaundice Neurologic:  alert & oriented X3.  Speech normal, gait appropriate for age and unassisted Strength symmetric and appropriate for age.  Psych: Cognition and judgment appear intact.  Cooperative with normal attention span and concentration.  Behavior appropriate. No anxious or depressed appearing.     Assessment       Assessment  Prediabetes: A1c 6.1 08-2014 HTN DX age 77 Insomnia  -  on [place] Early menopause - declines HRT H/o Knee pain Diverticulitis, 1st episode 02-2015, + perf, conservative Rx  History of food allergies, citrus?. Has an EpiPen  PLAN: Here for CPX HTN  Urgency : the patient tells me she is taking her medications as prescribed including carvedilol. States that her blood pressures are checked at work and they are typically 112 systolic. BP today is elevated, I rechecked it myself: 210/110 bilaterally. Previous   compliance with medication has  been a major issue. Again risk of uncontrolled HTN discussed with the patient in detail including risk of death, strokes and heart attack. We will refill her medications and ask her to come back in 1 week for another visit.  ER if she has any symptoms.  See AVS. Snoring: Sleep study pending. RTC 1 week   This visit occurred during the SARS-CoV-2 public health emergency.  Safety protocols were in place, including screening questions prior to the visit, additional usage of staff PPE, and extensive cleaning of exam room while observing appropriate contact time as indicated for disinfecting solutions.

## 2019-03-06 ENCOUNTER — Telehealth: Payer: Self-pay | Admitting: *Deleted

## 2019-03-06 NOTE — Telephone Encounter (Signed)
Poor compliance has been a problem all along, she is very aware of the consequences of high blood pressure. If she keeps the appointment with me next week I will counseling her even more.  She is on generics, however if cost is a issue we will have to address that as well.

## 2019-03-06 NOTE — Telephone Encounter (Signed)
FMLA paperwork for date 02/19/19 for 8 hours faxed on 03/04/19.

## 2019-03-06 NOTE — Assessment & Plan Note (Signed)
-   Td 2019, had a flu shot --CCS: never had a cscope, h/o diverticulitis, failed previous GI referral, states she will schedule  --female care: Last documented Pap smear 2018 and mammogram 2014; will Rx mmg and gyn referral @ this facility --diet, exercise: discussed --labs:  To be done when she comes back next week fasting.

## 2019-03-06 NOTE — Assessment & Plan Note (Addendum)
Here for CPX HTN  Urgency : the patient tells me she is taking her medications as prescribed including carvedilol. States that her blood pressures are checked at work and they are typically 112 systolic. BP today is elevated, I rechecked it myself: 210/110 bilaterally. Previous   compliance with medication has  been a major issue. Again risk of uncontrolled HTN discussed with the patient in detail including risk of death, strokes and heart attack. We will refill her medications and ask her to come back in 1 week for another visit.  ER if she has any symptoms.  See AVS. Snoring: Sleep study pending. RTC 1 week

## 2019-03-07 ENCOUNTER — Telehealth: Payer: Self-pay | Admitting: Internal Medicine

## 2019-03-07 ENCOUNTER — Other Ambulatory Visit: Payer: Self-pay

## 2019-03-08 NOTE — Telephone Encounter (Signed)
Needs to be seen in person. He has to bring all her medication bottles.

## 2019-03-10 ENCOUNTER — Ambulatory Visit (INDEPENDENT_AMBULATORY_CARE_PROVIDER_SITE_OTHER): Payer: 59 | Admitting: Internal Medicine

## 2019-03-10 ENCOUNTER — Other Ambulatory Visit: Payer: Self-pay

## 2019-03-10 ENCOUNTER — Encounter: Payer: Self-pay | Admitting: Internal Medicine

## 2019-03-10 VITALS — BP 192/109 | HR 81 | Temp 96.9°F | Resp 16 | Ht 66.0 in | Wt 215.5 lb

## 2019-03-10 DIAGNOSIS — I10 Essential (primary) hypertension: Secondary | ICD-10-CM

## 2019-03-10 NOTE — Progress Notes (Signed)
Subjective:    Patient ID: Sara Norris, female    DOB: 01-Dec-1965, 54 y.o.   MRN: [id number]  DOS:  03/10/2019 Type of visit - description: Follow-up HTN States that is taking "all her medications".  Admits to poor compliance to hydralazine due to palpitations. BP readings from today reviewed   Review of Systems  Denies chest pain or difficulty breathing No lower extremity edema No nausea, vomiting, diarrhea.  Past Medical History:  Diagnosis Date  . Allergy    seasonal  . Chronic knee pain   . Colonic diverticular abscess   . Contraception    menopausal  . Diverticulosis   . Hiatal hernia    per pt, not aware of this.  . Hypertension    dx age 53  . Menopause    LMP age 6  . Umbilical hernia    per pt/ nopt aware of this.    Past Surgical History:  Procedure Laterality Date  . CHOLECYSTECTOMY    . MYOMECTOMY     removed fibroids/ when she was in her 30"s  . TONSILLECTOMY              Objective:   Physical Exam BP (!) 192/109 (BP Location: Left Arm, Patient Position: Sitting, Cuff Size: Normal)   Pulse 81   Temp (!) 96.9 F (36.1 C) (Temporal)   Resp 16   Ht 5\' 6"  (1.676 m)   Wt 215 lb 8 oz (97.8 kg)   SpO2 100%   BMI 34.78 kg/m  General:   Well developed, NAD, BMI noted. HEENT:  Normocephalic . Face symmetric, atraumatic Lungs:  CTA B Normal respiratory effort, no intercostal retractions, no accessory muscle use. Heart: RRR,  no murmur.  No pretibial edema bilaterally  Skin: Not pale. Not jaundice Neurologic:  alert & oriented X3.  Speech normal, gait appropriate for age and unassisted Psych--  Cognition and judgment appear intact.  Cooperative with normal attention span and concentration.  Behavior appropriate. No anxious or depressed appearing.       Assessment        Assessment  Prediabetes: A1c 6.1 08-2014 HTN DX age 66 Insomnia - on ambien Early menopause - declines HRT H/o Knee pain Diverticulitis, 1st episode  02-2015, + perf, conservative Rx  History of food allergies, citrus?. Has an EpiPen  PLAN: HTN:  Since the last visit, we called her pharmacy, she has not been dispensed medications nearly enough to take them as prescribed, see phone note. She states she does not use any other pharmacy. Today, I called the [facility]  again, they refill chlorthalidone, hydralazine, carvedilol, potassium, losartan. They did not refill amlodipine (patient showed me a bottle of a leftover amlodipine 5 mg tablets)   In very simple terms I explained her that she is putting herself in grave danger. 1. If she does not take her medications and control her blood pressure she will suffer a early death, stroke, heart attack, renal failure, hemodialysis etc. 2. If she is not honest w/ her doctors and tells [place] what she is taking we will continue adding medications to her regimen and if one day she decides to take all her meds as prescribed,  she may have hypotension w/ severe consequences. After this conversation she still told me that she is taking her medications as prescribed. Also: She reports no issues with the cost of her medications. Hydralazine caused palpitations so she is not inclined to take it. BP readings today at work  203/118, 174/98 Plan: CMP Stop hydralazine due to palpitations, suspect that if she takes all her other medications regularly BP will be acceptable. Continue other medications per her list   Poor compliance: See above RTC 2 weeks  Today I spent 45 minutes with the patient, explaining her the dangers of hypertension, poor compliance and not sharing with her doctor is what she is really taking. Additional time spent calling her pharmacy.    This visit occurred during the SARS-CoV-2 public health emergency.  Safety protocols were in place, including screening questions prior to the visit, additional usage of staff PPE, and extensive cleaning of exam room while observing appropriate contact  time as indicated for disinfecting solutions.

## 2019-03-10 NOTE — Patient Instructions (Addendum)
GO TO THE LAB : Get the blood work     GO TO THE FRONT DESK Schedule your next appointment   for a checkup in person in 2 weeks    Check the  blood pressure 2 or 3 times a week BP GOAL is between 110/65 and  135/85. If it is consistently higher or lower, let me know   Okay to stop hydralazine the medication to cause palpitations.  Take all your medications as prescribed   Do not run out of any medications

## 2019-03-10 NOTE — Assessment & Plan Note (Signed)
HTN:  Since the last visit, we called her pharmacy, she has not been dispensed medications nearly enough to take them as prescribed, see phone note. She states she does not use any other pharmacy. Today, I called the [facility]  again, they refill chlorthalidone, hydralazine, carvedilol, potassium, losartan. They did not refill amlodipine (patient showed me a bottle of a leftover amlodipine 5 mg tablets)   In very simple terms I explained her that she is putting herself in grave danger. 1. If she does not take her medications and control her blood pressure she will suffer a early death, stroke, heart attack, renal failure, hemodialysis etc. 2. If she is not honest w/ her doctors and tells [place] what she is taking we will continue adding medications to her regimen and if one day she decides to take all her meds as prescribed,  she may have hypotension w/ severe consequences. After this conversation she still told me that she is taking her medications as prescribed. Also: She reports no issues with the cost of her medications. Hydralazine caused palpitations so she is not inclined to take it. BP readings today at work 203/118, 174/98 Plan: CMP Stop hydralazine due to palpitations, suspect that if she takes all her other medications regularly BP will be acceptable. Continue other medications per her list   Poor compliance: See above RTC 2 weeks

## 2019-03-10 NOTE — Progress Notes (Signed)
Pre visit review using our clinic review tool, if applicable. No additional management support is needed unless otherwise documented below in the visit note. 

## 2019-03-11 LAB — COMPREHENSIVE METABOLIC PANEL
ALT: 15 U/L (ref 0–35)
AST: 15 U/L (ref 0–37)
Albumin: 4.2 g/dL (ref 3.5–5.2)
Alkaline Phosphatase: 78 U/L (ref 39–117)
BUN: 22 mg/dL (ref 6–23)
CO2: 28 mEq/L (ref 19–32)
Calcium: 9.6 mg/dL (ref 8.4–10.5)
Chloride: 102 mEq/L (ref 96–112)
Creatinine, Ser: 0.99 mg/dL (ref 0.40–1.20)
GFR: 70.87 mL/min (ref >60.00)
Glucose, Bld: 85 mg/dL (ref 70–99)
Potassium: 4 mEq/L (ref 3.5–5.1)
Sodium: 138 mEq/L (ref 135–145)
Total Bilirubin: 0.5 mg/dL (ref 0.2–1.2)
Total Protein: 7.5 g/dL (ref 6.0–8.3)

## 2019-03-16 ENCOUNTER — Ambulatory Visit (INDEPENDENT_AMBULATORY_CARE_PROVIDER_SITE_OTHER): Payer: 59 | Admitting: Neurology

## 2019-03-16 DIAGNOSIS — G4733 Obstructive sleep apnea (adult) (pediatric): Secondary | ICD-10-CM

## 2019-03-16 DIAGNOSIS — Z9189 Other specified personal risk factors, not elsewhere classified: Secondary | ICD-10-CM

## 2019-03-16 DIAGNOSIS — R351 Nocturia: Secondary | ICD-10-CM

## 2019-03-16 DIAGNOSIS — R0683 Snoring: Secondary | ICD-10-CM

## 2019-03-16 DIAGNOSIS — E669 Obesity, unspecified: Secondary | ICD-10-CM

## 2019-03-16 DIAGNOSIS — R030 Elevated blood-pressure reading, without diagnosis of hypertension: Secondary | ICD-10-CM

## 2019-03-16 DIAGNOSIS — G479 Sleep disorder, unspecified: Secondary | ICD-10-CM

## 2019-03-16 DIAGNOSIS — G4720 Circadian rhythm sleep disorder, unspecified type: Secondary | ICD-10-CM

## 2019-03-18 ENCOUNTER — Ambulatory Visit: Payer: 59

## 2019-03-18 NOTE — Progress Notes (Deleted)
Patient ID: Sara Norris                 DOB: 1965/05/25                      MRN: [id number]     HPI: Sara Norris is a 54 y.o. female referred by Dr. Allyson Sabal to HTN clinic. PMH includes hypertension, insomnia, and diverticulitis.  Patient is employee at [employer] and reports original diagnoses of HTN in her 30s after her 1st pregnancy. Of note, she has occasional headaches that are related to allergies and she is currently taking a potassium supplement.  Patient has been seen by both cardiology and FP for blood pressure control.  Despite being prescribed 5 medications, her pressure has yet to be at goal, or even close.  In 2020, readings recorded in Epic average 178/102.  She was seen here in [facility] clinic in November when carvedilol was increased from 12.5 to 25 mg daily.  She has since cancelled one appointment (could not get off work, noted her pressure was "a little elevated" at neurology - 209/115), and saw her PCP just 2 weeks ago for the same concerns.  He had done some research on her compliance     At last appt on 12/17/2018 patient's BP appeared to be elevated with an office reading of 158/106 mmHg. Patient's home BP readings ranged from 140-150/90 mmHg per her memory. Patient was noted to ingest a high amount of "hidden sodium" in take out meals and sauces. Patient was instructed to decrease sodium in diet. HCTZ was discontinued and chlorthalidone 25 mg daily was initiated. All other BP meds (amlodipine 10mg  daily, losartan 100mg  daily, carvedilol 12.5mg  daily, and hydralazine 25mg  (8am monrnig & 1pm & 11pm)  were continued.  Patient presents for follow up appointment with HTN clinic. Pt has switched back from chlorthalidone to HCTZ 25 mg daily due to similar efficacy as HCTZ after 1-week trial of chlorthalidone and she stated chlorthalidone costs $10 while HCTZ costs $0. She may forget to take carvedilol 3x/week.  She mentioned she experienced palpitations when she initiated hydralazine,  however, the palpitations have resolved. She states she has taken clonidine in the past, which had "good effects" on her blood pressure. She noted that clonidine made her experience excessive somnolence which is why it was discontinued. She is tolerating all other HTN meds. She states she monitors her blood pressure on a regular basis in the morning, but does not record BP readings. She gets her BP checked by a nurse at work daily. She is not experiencing s/sx of hypotension, shortness of breath, chest pain, or headaches. She takes her carvedilol, amlodipine, and potassium supplement in the morning around 8AM. She takes her carvedilol, HCTZ, and losartan at night around 11PM. She takes hydralazine 8AM, 1PM, and 11PM.   Current HTN meds:  amlodipine 10mg  daily losartan 100mg  daily HCTZ 12.5mg  daily carvedilol 12.5mg  daily hydralazine 25mg  (8am morninig & 1pm & 11pm)  (added by Dr on 11/20/2018)  Prior HTN meds Chlorthalidone (cost - however HCTZ and chlorthalidone are tier 1 agents on insurance plan) Clonidine - excessive somnolence  BP goal: < 130/80 mmHg  Family History: HTN in mother, father, grandmother; MI in grandmother; DM in mother , grandfather  Social History: no tobacco, no alcohol (only social)  Diet: states she has been eating healthier; sometimes popeye's; baked meat & vegetables, chicken bisquet or muffins, likes sauces in meals. -Cabbage, broccoli (trying to  eat more green vegetables) -Baked chicken -Goes out to eat 2x per week - still will eat baked chicken and/or fish -Trying to cut out red meat  Exercise: walks 2-3x/week or gym at work (30 minutes)  Home BP readings: no log or records She reports BP readings are ~160/110 mmHg   Wt Readings from Last 3 Encounters:  03/10/19 215 lb 8 oz (97.8 kg)  03/04/19 215 lb 6.4 oz (97.7 kg)  01/29/19 212 lb (96.2 kg)   BP Readings from Last 3 Encounters:  03/10/19 (!) 192/109  03/04/19 (!) 192/94  01/29/19 (!)  209/115   Pulse Readings from Last 3 Encounters:  03/10/19 81  03/04/19 81  01/29/19 74    Renal function: Estimated Creatinine Clearance: 77.5 mL/min (by C-G formula based on SCr of 0.99 mg/dL).  Past Medical History:  Diagnosis Date  . Allergy    seasonal  . Chronic knee pain   . Colonic diverticular abscess   . Contraception    menopausal  . Diverticulosis   . Hiatal hernia    per pt, not aware of this.  . Hypertension    dx age 63  . Menopause    LMP age 43  . Umbilical hernia    per pt/ nopt aware of this.    Current Outpatient Medications on File Prior to Visit  Medication Sig Dispense Refill  . amLODipine (NORVASC) 10 MG tablet Take 1 tablet (10 mg total) by mouth daily. 30 tablet 6  . aspirin EC 81 MG tablet Take 81 mg by mouth daily.    . Blood Pressure Monitoring (BLOOD PRESSURE CUFF) MISC 1 Product by Does not apply route daily. MONITOR YOUR BLOOD PRESSURE 1-2 TIMES A DAY. 1 each 0  . carvedilol (COREG) 25 MG tablet Take 1 tablet (25 mg total) by mouth 2 (two) times daily. 30 tablet 2  . chlorthalidone (HYGROTON) 25 MG tablet Take 0.5 tablets (12.5 mg total) by mouth daily. 30 tablet 2  . Cholecalciferol (VITAMIN D3) 400 units tablet Take 400 Units by mouth daily.    Marland Kitchen EPINEPHrine 0.3 mg/0.3 mL IJ SOAJ injection Inject 0.3 mg into the muscle once.    Marland Kitchen losartan (COZAAR) 100 MG tablet Take 1 tablet (100 mg total) by mouth daily. 30 tablet 2  . potassium chloride (KLOR-CON) 10 MEQ tablet Take 1 tablet (10 mEq total) by mouth daily. 30 tablet 2  . zolpidem (AMBIEN) 10 MG tablet TAKE 1 TABLET BY MOUTH AT BEDTIME AS NEEDED FOR SLEEP. 30 tablet 3   No current facility-administered medications on file prior to visit.    Allergies  Allergen Reactions  . Soy Allergy     Itching of throat.  . Citrus Rash  . Codeine Rash  . Pollen Extract     sneezing     Assessment/Plan:   Tommy Medal PharmD CPP [facility] [address] [place], [place] [address] [phone]

## 2019-03-19 ENCOUNTER — Ambulatory Visit (HOSPITAL_BASED_OUTPATIENT_CLINIC_OR_DEPARTMENT_OTHER): Payer: 59

## 2019-03-21 ENCOUNTER — Other Ambulatory Visit: Payer: Self-pay

## 2019-03-24 ENCOUNTER — Encounter: Payer: Self-pay | Admitting: Internal Medicine

## 2019-03-24 ENCOUNTER — Other Ambulatory Visit: Payer: Self-pay

## 2019-03-24 ENCOUNTER — Ambulatory Visit: Payer: 59 | Admitting: Internal Medicine

## 2019-03-24 VITALS — BP 178/106 | HR 74 | Temp 96.8°F | Resp 16 | Ht 66.0 in | Wt 215.2 lb

## 2019-03-24 DIAGNOSIS — I10 Essential (primary) hypertension: Secondary | ICD-10-CM | POA: Diagnosis not present

## 2019-03-24 NOTE — Patient Instructions (Signed)
  GO TO THE FRONT DESK  Please come back for a check up in 2 weeks , make an appointment    Check the  blood pressure 2 or 3 times a week BP GOAL is between 110/65 and  135/85. If it is consistently higher or lower, let me know

## 2019-03-24 NOTE — Progress Notes (Signed)
Subjective:    Patient ID: Sara Norris, female    DOB: 02/26/1965, 54 y.o.   MRN: [id number]  DOS:  03/24/2019 Type of visit - description: Follow-up Since the last office visit, reports she is taking her medications as prescribed  Had a sleep study, results pending.  Has check ambulatory BPs and she share them with me.  Review of Systems  Denies chest pain, difficulty breathing.  No lower extremity edema or cramps  Past Medical History:  Diagnosis Date  . Allergy    seasonal  . Chronic knee pain   . Colonic diverticular abscess   . Contraception    menopausal  . Diverticulosis   . Hiatal hernia    per pt, not aware of this.  . Hypertension    dx age 57  . Menopause    LMP age 22  . Umbilical hernia    per pt/ nopt aware of this.    Past Surgical History:  Procedure Laterality Date  . CHOLECYSTECTOMY    . MYOMECTOMY     removed fibroids/ when she was in her 30"s  . TONSILLECTOMY      Social History   Socioeconomic History  . Marital status: Single    Spouse name: Not on file  . Number of children: 1  . Years of education: Not on file  . Highest education level: Not on file  Occupational History  . Occupation: Fish farm manager: [employer]  Tobacco Use  . Smoking status: Never Smoker  . Smokeless tobacco: Never Used  Substance and Sexual Activity  . Alcohol use: Yes    Comment: SOCIAL   . Drug use: No    Comment: no marijuana ;lately   . Sexual activity: Yes    Partners: Male    Comment: 1ST INTERCOURSE- 65, PARTNERS- 5  Other Topics Concern  . Not on file  Social History Narrative   enviromental services @ ICU - [facility]   Child lives independently   1 g-child     Social Determinants of Health   Financial Resource Strain:   . Difficulty of Paying Living Expenses: Not on file  Food Insecurity:   . Worried About Charity fundraiser in the Last Year: Not on file  . Ran Out of Food in the Last Year: Not on file   Transportation Needs:   . Lack of Transportation (Medical): Not on file  . Lack of Transportation (Non-Medical): Not on file  Physical Activity:   . Days of Exercise per Week: Not on file  . Minutes of Exercise per Session: Not on file  Stress:   . Feeling of Stress : Not on file  Social Connections:   . Frequency of Communication with Friends and Family: Not on file  . Frequency of Social Gatherings with Friends and Family: Not on file  . Attends Religious Services: Not on file  . Active Member of Clubs or Organizations: Not on file  . Attends Archivist Meetings: Not on file  . Marital Status: Not on file  Intimate Partner Violence:   . Fear of Current or Ex-Partner: Not on file  . Emotionally Abused: Not on file  . Physically Abused: Not on file  . Sexually Abused: Not on file      Allergies as of 03/24/2019      Reactions   Soy Allergy    Itching of throat.   Citrus Rash   Codeine Rash  Pollen Extract    sneezing      Medication List       Accurate as of March 24, 2019 11:59 PM. If you have any questions, ask your nurse or doctor.        amLODipine 10 MG tablet Commonly known as: NORVASC Take 1 tablet (10 mg total) by mouth daily.   aspirin EC 81 MG tablet Take 81 mg by mouth daily.   Blood Pressure Cuff Misc 1 Product by Does not apply route daily. MONITOR YOUR BLOOD PRESSURE 1-2 TIMES A DAY.   carvedilol 25 MG tablet Commonly known as: COREG Take 1 tablet (25 mg total) by mouth 2 (two) times daily.   chlorthalidone 25 MG tablet Commonly known as: HYGROTON Take 1 tablet (25 mg total) by mouth daily. What changed: how much to take Changed by: Willow Ora, MD   EPINEPHrine 0.3 mg/0.3 mL Soaj injection Commonly known as: EPI-PEN Inject 0.3 mg into the muscle once.   losartan 100 MG tablet Commonly known as: COZAAR Take 1 tablet (100 mg total) by mouth daily.   potassium chloride 10 MEQ tablet Commonly known as: KLOR-CON Take 1 tablet  (10 mEq total) by mouth daily.   Vitamin D3 10 MCG (400 UNIT) tablet Take 400 Units by mouth daily.   zolpidem 10 MG tablet Commonly known as: AMBIEN TAKE 1 TABLET BY MOUTH AT BEDTIME AS NEEDED FOR SLEEP.           Objective:   Physical Exam BP (!) 178/106 (BP Location: Right Arm, Patient Position: Sitting, Cuff Size: Normal)   Pulse 74   Temp (!) 96.8 F (36 C) (Temporal)   Resp 16   Ht 5\' 6"  (1.676 m)   Wt 215 lb 4 oz (97.6 kg)   SpO2 99%   BMI 34.74 kg/m  General:   Well developed, NAD, BMI noted. HEENT:  Normocephalic . Face symmetric, atraumatic Lungs:  CTA B Normal respiratory effort, no intercostal retractions, no accessory muscle use. Heart: RRR,  no murmur.  No pretibial edema bilaterally  Skin: Not pale. Not jaundice Neurologic:  alert & oriented X3.  Speech normal, gait appropriate for age and unassisted Psych--  Cognition and judgment appear intact.  Cooperative with normal attention span and concentration.  Behavior appropriate. No anxious or depressed appearing.      Assessment        Assessment  Prediabetes: A1c 6.1 08-2014 HTN DX age 54 Insomnia - on ambien Early menopause - declines HRT H/o Knee pain Diverticulitis, 1st episode 02-2015, + perf, conservative Rx  History of food allergies, citrus?. Has an EpiPen  PLAN: HTN:  Reports good med compliance, has check her BP twice: 174/91, 161/94 (this morning at home). BP here at the office is a still elevated, I recheck: 188/110.  She remains asymptomatic. Plan: Continue amlodipine 10 mg, carvedilol 25 mg B.I.D., losartan 100 mg, potassium supplements, increase chlorthalidone 25 mg from half to 1 tablet daily.  BMP when she RTC. Continue watching salt intake Suspected OSA:   study performed per patient, results pending Poor compliance: Hopefully improving RTC 2 weeks     This visit occurred during the SARS-CoV-2 public health emergency.  Safety protocols were in place, including  screening questions prior to the visit, additional usage of staff PPE, and extensive cleaning of exam room while observing appropriate contact time as indicated for disinfecting solutions.

## 2019-03-24 NOTE — Progress Notes (Signed)
Pre visit review using our clinic review tool, if applicable. No additional management support is needed unless otherwise documented below in the visit note. 

## 2019-03-25 ENCOUNTER — Telehealth: Payer: Self-pay

## 2019-03-25 NOTE — Progress Notes (Signed)
Patient referred by Dr. Drue Novel, seen by me on 01/29/19, diagnostic PSG on 03/16/19.   Please call and notify the patient that the recent sleep study showed severe obstructive sleep apnea. I recommend treatment for this in the form of CPAP. This will require a repeat sleep study for proper titration and mask fitting and correct monitoring of the oxygen saturations. Please explain to patient. I have placed an order in the chart. Thanks.  Huston Foley, MD, PhD [facility])

## 2019-03-25 NOTE — Assessment & Plan Note (Signed)
HTN:  Reports good med compliance, has check her BP twice: 174/91, 161/94 (this morning at home). BP here at the office is a still elevated, I recheck: 188/110.  She remains asymptomatic. Plan: Continue amlodipine 10 mg, carvedilol 25 mg B.I.D., losartan 100 mg, potassium supplements, increase chlorthalidone 25 mg from half to 1 tablet daily.  BMP when she RTC. Continue watching salt intake Suspected OSA:   study performed per patient, results pending Poor compliance: Hopefully improving RTC 2 weeks

## 2019-03-25 NOTE — Procedures (Signed)
PATIENT'S NAME:  Sara Norris, Sara Norris DOB:      30-Jun-1965      MR#:    [id number]     DATE OF RECORDING: 03/16/2019 REFERRING M.D.:  Willow Ora, MD Study Performed:   Baseline Polysomnogram HISTORY: 54 year old woman with a history of seasonal allergies, diverticulosis, hypertension, chronic knee pain and mild obesity, who reports snoring and excessive daytime somnolence. The patient endorsed the Epworth Sleepiness Scale at 6 points. The patient's weight 212 pounds with a height of 69 (inches), resulting in a BMI of 31.3 kg/m2. The patient's neck circumference measured 15.5 inches.  CURRENT MEDICATIONS: Norvasc, Aspirin, Coreg, Hygroton, Apresoline, Cozaar, Ambien   PROCEDURE:  This is a multichannel digital polysomnogram utilizing the Somnostar 11.2 system.  Electrodes and sensors were applied and monitored per AASM Specifications.   EEG, EOG, Chin and Limb EMG, were sampled at 200 Hz.  ECG, Snore and Nasal Pressure, Thermal Airflow, Respiratory Effort, CPAP Flow and Pressure, Oximetry was sampled at 50 Hz. Digital video and audio were recorded.      BASELINE STUDY  Lights Out was at 21:37 and Lights On at 05:02.  Total recording time (TRT) was 445 minutes, with a total sleep time (TST) of 276.5 minutes.   The patient's sleep latency was 87 minutes, which is delayed. REM latency was 157 minutes, which is delayed. The sleep efficiency was 62.1 %.     SLEEP ARCHITECTURE: WASO (Wake after sleep onset) was 120 minutes with moderate to severe sleep fragmentation noted. There were 82.5 minutes in Stage N1, 93 minutes Stage N2, 41.5 minutes Stage N3 and 59.5 minutes in Stage REM.  The percentage of Stage N1 was 29.8%, which is markedly increased, Stage N2 was 33.6%, Stage N3 was 15.%, which is normal, and Stage R (REM sleep) was 21.5%, which is normal. The arousals were noted as: 55 were spontaneous, 0 were associated with PLMs, 158 were associated with respiratory events.  RESPIRATORY ANALYSIS:  There were a total  of 204 respiratory events:  106 obstructive apneas, 0 central apneas and 35 mixed apneas with a total of 141 apneas and an apnea index (AI) of 30.6 /hour. There were 63 hypopneas with a hypopnea index of 13.7 /hour. The patient also had 0 respiratory event related arousals (RERAs).      The total APNEA/HYPOPNEA INDEX (AHI) was 44.3/hour and the total RESPIRATORY DISTURBANCE INDEX was  44.3 /hour.  34 events occurred in REM sleep and 117 events in NREM. The REM AHI was  34.3 /hour, versus a non-REM AHI of 47.. The patient spent 14.5 minutes of total sleep time in the supine position and 262 minutes in non-supine.. The supine AHI was 99.3 versus a non-supine AHI of 41.3.  OXYGEN SATURATION & C02:  The Wake baseline 02 saturation was 96%, with the lowest being 82%. Time spent below 89% saturation equaled 34 minutes.  PERIODIC LIMB MOVEMENTS: The patient had a total of 0 Periodic Limb Movements.  The Periodic Limb Movement (PLM) index was 0 and the PLM Arousal index was 0/hour.  Audio and video analysis did not show any abnormal or unusual movements, behaviors, phonations or vocalizations. The patient took 2 bathroom breaks. Moderate to loud snoring was noted. The EKG was in keeping with normal sinus rhythm (NSR).  Post-study, the patient indicated that sleep was the same as usual.   IMPRESSION:  1. Obstructive Sleep Apnea (OSA) 2. Dysfunctions associated with sleep stages or arousal from sleep  RECOMMENDATIONS:  1. This study demonstrates severe  obstructive sleep apnea, with a total AHI of 44.3/hour, REM AHI of 34.3/hour, supine AHI of 99.3/hour and O2 nadir of 82%. Treatment with positive airway pressure in the form of CPAP is recommended. This will require a full night titration study to optimize therapy. Other treatment options may include avoidance of supine sleep position along with weight loss, upper airway or jaw surgery in selected patients or the use of an oral appliance in certain patients.  ENT evaluation and/or consultation with a maxillofacial surgeon or dentist may be feasible in some instances.    2. Please note that untreated obstructive sleep apnea may carry additional perioperative morbidity. Patients with significant obstructive sleep apnea should receive perioperative PAP therapy and the surgeons and particularly the anesthesiologist should be informed of the diagnosis and the severity of the sleep disordered breathing. 3. This study shows sleep fragmentation and abnormal sleep stage percentages; these are nonspecific findings and per se do not signify an intrinsic sleep disorder or a cause for the patient's sleep-related symptoms. Causes include (but are not limited to) the first night effect of the sleep study, circadian rhythm disturbances, medication effect or an underlying mood disorder or medical problem.  4. The patient should be cautioned not to drive, work at heights, or operate dangerous or heavy equipment when tired or sleepy. Review and reiteration of good sleep hygiene measures should be pursued with any patient.  I certify that I have reviewed the entire raw data recording prior to the issuance of this report in accordance with the Standards of Accreditation of the American Academy of Sleep Medicine (AASM)  Star Age, MD, PhD Diplomat, American Board of Neurology and Sleep Medicine (Neurology and Sleep Medicine)

## 2019-03-25 NOTE — Telephone Encounter (Signed)
-----   Message from Huston Foley, MD sent at 03/25/2019  8:08 AM EST ----- Patient referred by Dr. Drue Novel, seen by me on 01/29/19, diagnostic PSG on 03/16/19.   Please call and notify the patient that the recent sleep study showed severe obstructive sleep apnea. I recommend treatment for this in the form of CPAP. This will require a repeat sleep study for proper titration and mask fitting and correct monitoring of the oxygen saturations. Please explain to patient. I have placed an order in the chart. Thanks.  Huston Foley, MD, PhD [facility])

## 2019-03-25 NOTE — Addendum Note (Signed)
Addended by: Huston Foley on: 03/25/2019 08:08 AM   Modules accepted: Orders

## 2019-03-25 NOTE — Telephone Encounter (Signed)
I reached out to the pt and advised of results. PT verbalized understanding and is agreeable to cpap titration study.

## 2019-03-27 ENCOUNTER — Other Ambulatory Visit: Payer: Self-pay

## 2019-03-27 ENCOUNTER — Ambulatory Visit (HOSPITAL_BASED_OUTPATIENT_CLINIC_OR_DEPARTMENT_OTHER)
Admission: RE | Admit: 2019-03-27 | Discharge: 2019-03-27 | Disposition: A | Discharge status: Home / Self Care | Payer: 59 | Source: Ambulatory Visit | Attending: Internal Medicine | Admitting: Internal Medicine

## 2019-03-27 DIAGNOSIS — Z1231 Encounter for screening mammogram for malignant neoplasm of breast: Secondary | ICD-10-CM | POA: Diagnosis not present

## 2019-04-01 ENCOUNTER — Telehealth: Payer: Self-pay

## 2019-04-01 NOTE — Telephone Encounter (Signed)
Sara (B.):  Since she has severe OSA, I would not recommend, she leave her OSA untreated.  We can Consider alternative treatment options to CPAP therapy, and I would recommend referrals to ENT and/or Dentistry for alternative treatment options. Pt can talk to her own dentist about getting an oral appliance made. If she is open to talking to an ENT surgeon about surgical options, Including an Implantable device called inspire, I would be happy to make a referral.  If her own dentist does not provide treatment for sleep apnea can also make a referral to dentistry to one of the local dentists that provide treatment for sleep apnea.  Again, severe obstructive sleep apnea can cause long-term problems with heart, blood pressure, and brain.  We have talked about This during the appointment and I would highly recommend that she seek some alternative treatment for her severe sleep apnea. Please call pt back to advise of the above. If she would like a follow-up appointment to discuss in person, please schedule a follow-up appointment. Thank you, Meagan, for your note.

## 2019-04-01 NOTE — Telephone Encounter (Signed)
Pt has changed her mind and is not interested in CPAP. Pt has refused to schedule the CPAP sleep study. Pt states that she wanted to be honest with [place] and she doubts she would even use it. Pt was asked that is she changed her mind to call me back here at the sleep lab.

## 2019-04-02 MED FILL — ZOLPIDEM TARTRATE 10 MG TAB: 10 | 30 days supply | Qty: 30 | Fill #3

## 2019-04-02 NOTE — Telephone Encounter (Signed)
Lvm asking pt to call me back so we could review Dr. Teofilo Pod recommendations.

## 2019-04-03 ENCOUNTER — Other Ambulatory Visit: Payer: Self-pay

## 2019-04-03 ENCOUNTER — Ambulatory Visit (INDEPENDENT_AMBULATORY_CARE_PROVIDER_SITE_OTHER): Payer: 59 | Admitting: Pharmacist Clinician (PhC)/ Clinical Pharmacy Specialist

## 2019-04-03 DIAGNOSIS — I10 Essential (primary) hypertension: Secondary | ICD-10-CM

## 2019-04-03 MED ORDER — VALSARTAN 320 MG PO TABS: 320.0000 mg | ORAL_TABLET | Freq: Every day | ORAL | 3 refills | Status: DC

## 2019-04-03 MED FILL — VALSARTAN 320 MG TABLET: 320 | 90 days supply | Qty: 90 | Fill #0

## 2019-04-03 NOTE — Progress Notes (Signed)
Patient ID: [name]                 DOB: 1965/06/27                      MRN: [id number]     HPI: Sara Norris is a 54 y.o. female referred by Dr. Gwenlyn Norris to HTN clinic. PMH includes hypertension, insomnia, and diverticulitis.  Patient is employee at [employer] and reports original diagnoses of HTN in get 71s after her 1st pregnancy. Denies  problems with her current therapy. She has occasional headaches associated with allergies but denies blurry vision or chest pain , blurry vision , or chest pain.   Patient has been seen by different providers over the past few years trying to help her with her out of control blood pressures.  She has a history of non-compliance with medication, as evidenced by her lack of refills at the pharmacy.  She admits to not liking to take medications, but denies missing doses.  Recently she called, asking to decrease carvedilol dose, as she thought it was causing headaches, and her pressure was only "a little elevated" at her neurology appointment (was 209/115 per Epic).    Patient has also been seeing Sara Norris in primary care for her elevated blood pressure, and per chart she shows a history of not refilling her medications.  In December she called the office, asking to cut carvedilol dose in half despite her BP being "a little elevated" when she was at neurology appointment.  (Her pressure was 209/115 at that time).  Neurology has been working with her OSA and is trying to get her fitted for CPAP  Today she is in the office for follow up.  She was due to see Sara Norris this week for hypertension.  She states taking her medications as listed below, without any missed doses.    Current HTN meds:  amlodipine 10mg  daily Losartan 100mg  daily Chlorthalidone 25 mg daily Carvedilol 25mg  twice daily   BP goal: 130/80  Family History: HTN in mother, father, grandmother; MI in grandmother; DM in mother , grandfather  Social History: no tobacco, social drinks wine or mixed  drinks 1-2 times per month  Diet: denies eating out ; baked meat & vegetables, chicken bisquet or muffins, likes sauces in meals.  [place], salmon, chicken for meats; cut back on sodas, drinking more water  Exercise: walks 2-3x/week or gym at work (30 minute breaks at work)  Home BP readings: no log or records 140/97; 150/97 - per patient history  Wt Readings from Last 3 Encounters:  04/03/19 213 lb 8 oz (96.8 kg)  03/24/19 215 lb 4 oz (97.6 kg)  03/10/19 215 lb 8 oz (97.8 kg)   BP Readings from Last 3 Encounters:  04/03/19 (!) 208/106  03/24/19 (!) 178/106  03/10/19 (!) 192/109   Pulse Readings from Last 3 Encounters:  04/03/19 82  03/24/19 74  03/10/19 81     Past Medical History:  Diagnosis Date  . Allergy    seasonal  . Chronic knee pain   . Colonic diverticular abscess   . Contraception    menopausal  . Diverticulosis   . Hiatal hernia    per pt, not aware of this.  . Hypertension    dx age 76  . Menopause    LMP age 32  . Umbilical hernia    per pt/ nopt aware of this.    Current Outpatient Medications  on File Prior to Visit  Medication Sig Dispense Refill  . amLODipine (NORVASC) 10 MG tablet Take 1 tablet (10 mg total) by mouth daily. 30 tablet 6  . aspirin EC 81 MG tablet Take 81 mg by mouth daily.    . Blood Pressure Monitoring (BLOOD PRESSURE CUFF) MISC 1 Product by Does not apply route daily. MONITOR YOUR BLOOD PRESSURE 1-2 TIMES A DAY. 1 each 0  . carvedilol (COREG) 25 MG tablet Take 1 tablet (25 mg total) by mouth 2 (two) times daily. 30 tablet 2  . chlorthalidone (HYGROTON) 25 MG tablet Take 1 tablet (25 mg total) by mouth daily.    . Cholecalciferol (VITAMIN D3) 400 units tablet Take 400 Units by mouth daily.    Marland Kitchen EPINEPHrine 0.3 mg/0.3 mL IJ SOAJ injection Inject 0.3 mg into the muscle once.    Marland Kitchen losartan (COZAAR) 100 MG tablet Take 1 tablet (100 mg total) by mouth daily. 30 tablet 2  . potassium chloride (KLOR-CON) 10 MEQ tablet Take 1 tablet (10  mEq total) by mouth daily. 30 tablet 2  . zolpidem (AMBIEN) 10 MG tablet TAKE 1 TABLET BY MOUTH AT BEDTIME AS NEEDED FOR SLEEP. 30 tablet 3   No current facility-administered medications on file prior to visit.    Allergies  Allergen Reactions  . Soy Allergy     Itching of throat.  . Citrus Rash  . Codeine Rash  . Pollen Extract     sneezing    Blood pressure (!) 208/106, pulse 82, height 5\' 8"  (1.727 m), weight 213 lb 8 oz (96.8 kg), SpO2 97 %.  Essential hypertension Patient with dangerously high systolic and diastolic hypertension.  We had a long discussion on the implications of prolonged elevated blood pressure, including risk of stroke, heart failure and kidney failure.  Explained what the systolic and diastolic numbers represent and how she is not going to go much longer before serious, non-reversible damage is done.  She has a young grandson, and I explained that on her current path, she will not be able to be the active grandmother she wants to be as he grows up.  She has always downplayed her readings as essentially "they are what they are", but I stressed that with a little effort on her part, she could give herself a longer life.  She mentioned wanting to live to 100, but I assured her that this would not be likely if we cannot get better control and compliance.  Today I am switching the losartan to valsartan in hopes of better control when she does take medications.  I asked her to return in 3 weeks, at which time she needs to bring in all her medication bottles so we can discuss time of day and how she divides them up.  I will also reach out to [facility] to determine most recent refills on these medications.     Sara Norris PharmD CPP [facility] [address] [place] 04/05/2019 8:34 AM

## 2019-04-03 NOTE — Patient Instructions (Signed)
Return for a a follow up appointment in 3 weeks  Check your blood pressure at home daily (if able) and keep record of the readings.  Take your BP meds as follows:  Stop losartan and start valsartan 320 mg once daily.    Bring all of your meds and your record of home blood pressures to your next appointment.  Exercise as you're able, try to walk approximately 30 minutes per day.  Keep salt intake to a minimum, especially watch canned and prepared boxed foods.  Eat more fresh fruits and vegetables and fewer canned items.  Avoid eating in fast food restaurants.    HOW TO TAKE YOUR BLOOD PRESSURE: . Rest 5 minutes before taking your blood pressure. .  Don't smoke or drink caffeinated beverages for at least 30 minutes before. . Take your blood pressure before (not after) you eat. . Sit comfortably with your back supported and both feet on the floor (don't cross your legs). . Elevate your arm to heart level on a table or a desk. . Use the proper sized cuff. It should fit smoothly and snugly around your bare upper arm. There should be enough room to slip a fingertip under the cuff. The bottom edge of the cuff should be 1 inch above the crease of the elbow. . Ideally, take 3 measurements at one sitting and record the average.

## 2019-04-05 ENCOUNTER — Encounter: Payer: Self-pay | Admitting: Pharmacist Clinician (PhC)/ Clinical Pharmacy Specialist

## 2019-04-05 NOTE — Assessment & Plan Note (Addendum)
Patient with dangerously high systolic and diastolic hypertension.  We had a long discussion on the implications of prolonged elevated blood pressure, including risk of stroke, heart failure and kidney failure.  Explained what the systolic and diastolic numbers represent and how she is not going to go much longer before serious, non-reversible damage is done.  She has a young grandson, and I explained that on her current path, she will not be able to be the active grandmother she wants to be as he grows up.  She has always downplayed her readings as essentially "they are what they are", but I stressed that with a little effort on her part, she could give herself a longer life.  She mentioned wanting to live to 89, but I assured her that this would not be likely if we cannot get better control and compliance.  Today I am switching the losartan to valsartan in hopes of better control when she does take medications.  I asked her to return in 3 weeks, at which time she needs to bring in all her medication bottles so we can discuss time of day and how she divides them up.  I will also reach out to her pharmacy [facility]) to determine most recent refills on these medications.

## 2019-04-08 NOTE — Telephone Encounter (Signed)
I attempted to reach the pt for the second time and had to leave a vm. Pt was advised via vm Dr. Frances Furbish does not recommend to leave her OSA untreated. Pt was asked to call back so I could review the other options with her. I will wait to hear back from the pt.

## 2019-04-09 ENCOUNTER — Telehealth: Payer: Self-pay

## 2019-04-09 DIAGNOSIS — Z0279 Encounter for issue of other medical certificate: Secondary | ICD-10-CM

## 2019-04-09 NOTE — Telephone Encounter (Signed)
FMLA faxed to [employer] at [phone]. Form sent for scanning.

## 2019-04-09 NOTE — Telephone Encounter (Signed)
Received fax confirmation

## 2019-04-09 NOTE — Telephone Encounter (Signed)
signed

## 2019-04-09 NOTE — Telephone Encounter (Signed)
FMLA paperwork completed- placed in PCP red folder for signing.

## 2019-04-24 ENCOUNTER — Encounter: Payer: 59 | Admitting: Family Medicine

## 2019-05-01 ENCOUNTER — Ambulatory Visit: Payer: 59

## 2019-05-07 ENCOUNTER — Telehealth: Payer: Self-pay | Admitting: Internal Medicine

## 2019-05-07 MED FILL — VALSARTAN 320 MG TABLET: 320 | 30 days supply | Qty: 30 | Fill #0

## 2019-05-07 NOTE — Telephone Encounter (Signed)
Ambien refill.   Last OV: 03/24/2019  Last Fill: 12/13/2018 #30 and 3RF Pt sig: 1 tab qhs prn UDS: Ambien only

## 2019-05-08 MED FILL — ZOLPIDEM TARTRATE 10 MG TAB: 10 | 30 days supply | Qty: 30 | Fill #0

## 2019-05-08 MED FILL — AMLODIPINE BESYLATE 10 MG T: 10 | 30 days supply | Qty: 30 | Fill #1

## 2019-05-08 MED FILL — CARVEDILOL 25 MG TABLET: 25 | 15 days supply | Qty: 30 | Fill #1

## 2019-05-08 NOTE — Telephone Encounter (Signed)
Sara Norris, tell her I sent a prescription for Ambien. She is overdue for a visit, please arrange, she needs to be seen within the next few days in person.

## 2019-05-08 NOTE — Telephone Encounter (Signed)
Pt will be in on next Wednesday at 4pm

## 2019-05-13 ENCOUNTER — Other Ambulatory Visit: Payer: Self-pay

## 2019-05-14 ENCOUNTER — Ambulatory Visit: Payer: 59 | Admitting: Internal Medicine

## 2019-05-14 ENCOUNTER — Ambulatory Visit (INDEPENDENT_AMBULATORY_CARE_PROVIDER_SITE_OTHER): Payer: 59 | Admitting: Internal Medicine

## 2019-05-14 ENCOUNTER — Encounter: Payer: Self-pay | Admitting: Internal Medicine

## 2019-05-14 VITALS — BP 162/94 | HR 89 | Temp 97.0°F | Resp 18 | Ht 68.0 in | Wt 213.2 lb

## 2019-05-14 DIAGNOSIS — I10 Essential (primary) hypertension: Secondary | ICD-10-CM | POA: Diagnosis not present

## 2019-05-14 DIAGNOSIS — G4733 Obstructive sleep apnea (adult) (pediatric): Secondary | ICD-10-CM

## 2019-05-14 MED ORDER — CHLORTHALIDONE 25 MG PO TABS: 37.5000 mg | ORAL_TABLET | Freq: Every day | ORAL | 3 refills | Status: DC

## 2019-05-14 MED ORDER — POTASSIUM CHLORIDE ER 10 MEQ PO TBCR: 20.0000 meq | EXTENDED_RELEASE_TABLET | Freq: Every day | ORAL | 3 refills | Status: DC

## 2019-05-14 MED FILL — POTASSIUM CL ER 10 MEQ TAB: 10 | 30 days supply | Qty: 60 | Fill #0

## 2019-05-14 MED FILL — CHLORTHALIDONE 25 MG TABS: 25 | 30 days supply | Qty: 45 | Fill #0

## 2019-05-14 NOTE — Patient Instructions (Addendum)
Increase chlorthalidone 25 mg to: 1.5 tablets every day  Increase potassium chloride 10 mEq to: TWO tablets daily  Check the  blood pressure 2 or 3 times a week BP GOAL is between 110/65 and  135/85. If it is consistently higher or lower, let me know    GO TO THE LAB : Get the blood work     GO TO THE FRONT DESK, please reschedule your appointments Come back for labs in 2 weeks  Come back for office visit in 2 months

## 2019-05-14 NOTE — Progress Notes (Signed)
Pre visit review using our clinic review tool, if applicable. No additional management support is needed unless otherwise documented below in the visit note. 

## 2019-05-14 NOTE — Progress Notes (Signed)
Subjective:    Patient ID: Sara Norris, female    DOB: 11-May-1965, 53 y.o.   MRN: [id number]  DOS:  05/14/2019 Type of visit - description: Follow-up Since the last office visit, saw the Pharm.D. at cardiology, note reviewed. Reports good compliance with medication. Reports her BPs are check at work, could not tell me readings but she said BPs were okay.  Also recently diagnosed with sleep apnea.   Wt Readings from Last 3 Encounters:  05/14/19 213 lb 4 oz (96.7 kg)  04/03/19 213 lb 8 oz (96.8 kg)  03/24/19 215 lb 4 oz (97.6 kg)     Review of Systems Denies chest pain, difficulty breathing No headache or dizziness  Past Medical History:  Diagnosis Date  . Allergy    seasonal  . Chronic knee pain   . Colonic diverticular abscess   . Contraception    menopausal  . Diverticulosis   . Hiatal hernia    per pt, not aware of this.  . Hypertension    dx age 39  . Menopause    LMP age 62  . Umbilical hernia    per pt/ nopt aware of this.    Past Surgical History:  Procedure Laterality Date  . CHOLECYSTECTOMY    . MYOMECTOMY     removed fibroids/ when she was in her 30"s  . TONSILLECTOMY      Allergies as of 05/14/2019      Reactions   Soy Allergy    Itching of throat.   Citrus Rash   Codeine Rash   Pollen Extract    sneezing      Medication List       Accurate as of May 14, 2019 11:59 PM. If you have any questions, ask your nurse or doctor.        STOP taking these medications   losartan 100 MG tablet Commonly known as: COZAAR Stopped by: Kathlene November, MD     TAKE these medications   amLODipine 10 MG tablet Commonly known as: NORVASC Take 1 tablet (10 mg total) by mouth daily.   aspirin EC 81 MG tablet Take 81 mg by mouth daily.   Blood Pressure Cuff Misc 1 Product by Does not apply route daily. MONITOR YOUR BLOOD PRESSURE 1-2 TIMES A DAY.   carvedilol 25 MG tablet Commonly known as: COREG Take 1 tablet (25 mg total) by mouth 2 (two) times  daily.   chlorthalidone 25 MG tablet Commonly known as: HYGROTON Take 1.5 tablets (37.5 mg total) by mouth daily.   EPINEPHrine 0.3 mg/0.3 mL Soaj injection Commonly known as: EPI-PEN Inject 0.3 mg into the muscle once.   potassium chloride 10 MEQ tablet Commonly known as: KLOR-CON Take 2 tablets (20 mEq total) by mouth daily. What changed: how much to take Changed by: Kathlene November, MD   valsartan 320 MG tablet Commonly known as: DIOVAN Take 1 tablet (320 mg total) by mouth daily.   Vitamin D3 10 MCG (400 UNIT) tablet Take 400 Units by mouth daily.   zolpidem 10 MG tablet Commonly known as: AMBIEN TAKE 1 TABLET BY MOUTH AT BEDTIME AS NEEDED FOR SLEEP.          Objective:   Physical Exam BP (!) 162/94 (BP Location: Left Arm, Patient Position: Sitting, Cuff Size: Normal)   Pulse 89   Temp (!) 97 F (36.1 C) (Temporal)   Resp 18   Ht 5\' 8"  (1.727 m)   Wt 213 lb 4 oz (  96.7 kg)   SpO2 100%   BMI 32.42 kg/m  General:   Well developed, NAD, BMI noted. HEENT:  Normocephalic . Face symmetric, atraumatic Lungs:  CTA B Normal respiratory effort, no intercostal retractions, no accessory muscle use. Heart: RRR,  no murmur.  Lower extremities: no pretibial edema bilaterally  Skin: Not pale. Not jaundice Neurologic:  alert & oriented X3.  Speech normal, gait appropriate for age and unassisted Psych--  Cognition and judgment appear intact.  Cooperative with normal attention span and concentration.  Behavior appropriate. No anxious or depressed appearing.      Assessment    ASSESSMENT Prediabetes: A1c 6.1 08-2014 HTN DX age 43 Insomnia - on ambien Early menopause - declines HRT H/o Knee pain Diverticulitis, 1st episode 02-2015, + perf, conservative Rx  History of food allergies, citrus?. Has an EpiPen  PLAN: HTN: Was seen at cardiology by the  Pharm.D., she had a very good conversation with the patient, appreciate her help.  Losartan was switched to valsartan BP  then was 280/106, today 162/94. Again patient is aware of the risk of uncontrolled HTN. Plan: BMP today Increase chlorthalidone 25mg  to 1.5 tablets daily Increase KCl to 2 tablets daily Other medications the same BMP in 2 weeks Next visit in 2 months Sleep apnea: Since the last visit, she was dx with severe sleep apnea.  Patient is aware that the use of a CPAP will significantly decrease her cardiovascular risk factor and help BP control.  She already tried a full mask and that did not work for her, will try the nasal pillows.  Encouraged to do her best to get used to the CPAP. RTC 2 weeks for BMP and 2 months for a follow-up    This visit occurred during the SARS-CoV-2 public health emergency.  Safety protocols were in place, including screening questions prior to the visit, additional usage of staff PPE, and extensive cleaning of exam room while observing appropriate contact time as indicated for disinfecting solutions.

## 2019-05-15 DIAGNOSIS — G4733 Obstructive sleep apnea (adult) (pediatric): Secondary | ICD-10-CM | POA: Insufficient documentation

## 2019-05-15 LAB — BASIC METABOLIC PANEL
BUN: 21 mg/dL (ref 6–23)
CO2: 30 mEq/L (ref 19–32)
Calcium: 9.7 mg/dL (ref 8.4–10.5)
Chloride: 100 mEq/L (ref 96–112)
Creatinine, Ser: 0.89 mg/dL (ref 0.40–1.20)
GFR: 80.08 mL/min (ref >60.00)
Glucose, Bld: 101 mg/dL — ABNORMAL HIGH (ref 70–99)
Potassium: 3.8 mEq/L (ref 3.5–5.1)
Sodium: 137 mEq/L (ref 135–145)

## 2019-05-15 NOTE — Assessment & Plan Note (Signed)
HTN: Was seen at cardiology by the  Pharm.D., she had a very good conversation with the patient, appreciate her help.  Losartan was switched to valsartan BP then was 280/106, today 162/94. Again patient is aware of the risk of uncontrolled HTN. Plan: BMP today Increase chlorthalidone 25mg  to 1.5 tablets daily Increase KCl to 2 tablets daily Other medications the same BMP in 2 weeks Next visit in 2 months Sleep apnea: Since the last visit, she was dx with severe sleep apnea.  Patient is aware that the use of a CPAP will significantly decrease her cardiovascular risk factor and help BP control.  She already tried a full mask and that did not work for her, will try the nasal pillows.  Encouraged to do her best to get used to the CPAP. RTC 2 weeks for BMP and 2 months for a follow-up

## 2019-06-02 ENCOUNTER — Ambulatory Visit (INDEPENDENT_AMBULATORY_CARE_PROVIDER_SITE_OTHER): Payer: 59

## 2019-06-02 ENCOUNTER — Ambulatory Visit
Admission: EM | Admit: 2019-06-02 | Discharge: 2019-06-02 | Disposition: A | Discharge status: Home / Self Care | Payer: 59 | Attending: Physician Assistant | Admitting: Physician Assistant

## 2019-06-02 DIAGNOSIS — M25561 Pain in right knee: Secondary | ICD-10-CM

## 2019-06-02 DIAGNOSIS — S6992XA Unspecified injury of left wrist, hand and finger(s), initial encounter: Secondary | ICD-10-CM | POA: Diagnosis not present

## 2019-06-02 DIAGNOSIS — M25532 Pain in left wrist: Secondary | ICD-10-CM

## 2019-06-02 DIAGNOSIS — W19XXXA Unspecified fall, initial encounter: Secondary | ICD-10-CM

## 2019-06-02 DIAGNOSIS — M545 Low back pain, unspecified: Secondary | ICD-10-CM

## 2019-06-02 MED ORDER — MELOXICAM 7.5 MG PO TABS: 7.5000 mg | ORAL_TABLET | Freq: Every day | ORAL | 0 refills | Status: DC

## 2019-06-02 MED FILL — POTASSIUM CL ER 10 MEQ TAB: 10 | 30 days supply | Qty: 60 | Fill #0

## 2019-06-02 MED FILL — CHLORTHALIDONE 25 MG TABS: 25 | 30 days supply | Qty: 45 | Fill #0

## 2019-06-02 MED FILL — MELOXICAM 7.5 MG TABLET: 7.5 | 15 days supply | Qty: 15 | Fill #0

## 2019-06-02 NOTE — ED Provider Notes (Signed)
[facility]    CSN: [id number] Arrival date & time: 06/02/19  0907      History   Chief Complaint Chief Complaint  Patient presents with  . Fall    HPI Sara Norris is a 54 y.o. female.   54 year old female comes in for right knee pain, left wrist pain, low back pain after falling yesterday.  She tripped and fell on concrete while running after her grandchild.  Landed with left hand outstretched.  Denies head injury, loss of consciousness.  Needed assistance ambulating, but able to bear weight, though with some pain.  She has abrasions to the left palm, left knee.  Bleeding controlled.  Woke up today with worsening pain, and therefore came in for evaluation.  Denies numbness, tingling.  Has not taken anything for the symptoms.     Past Medical History:  Diagnosis Date  . Allergy    seasonal  . Chronic knee pain   . Colonic diverticular abscess   . Contraception    menopausal  . Diverticulosis   . Hiatal hernia    per pt, not aware of this.  . Hypertension    dx age 76  . Menopause    LMP age 17  . Umbilical hernia    per pt/ nopt aware of this.    Patient Active Problem List   Diagnosis Date Noted  . OSA DX 02-2019 05/15/2019  . Headache 09/03/2017  . Acute diverticulitis, h/o 03/08/2015  . PCP NOTES >>>>> 01/16/2015  . Insomnia 06/17/2013  . General medical examination 05/24/2010  . Essential hypertension 04/09/2009    Past Surgical History:  Procedure Laterality Date  . CHOLECYSTECTOMY    . MYOMECTOMY     removed fibroids/ when she was in her 30"s  . TONSILLECTOMY      OB History    Gravida  3   Para  1   Term      Preterm      AB  2   Living  1     SAB      TAB      Ectopic      Multiple      Live Births               Home Medications    Prior to Admission medications   Medication Sig Start Date End Date Taking? Authorizing Provider  amLODipine (NORVASC) 10 MG tablet Take 1 tablet (10 mg total) by mouth  daily. 09/03/18   Colon Branch, MD  aspirin EC 81 MG tablet Take 81 mg by mouth daily.    [provider]  Blood Pressure Monitoring (BLOOD PRESSURE CUFF) MISC 1 Product by Does not apply route daily. MONITOR YOUR BLOOD PRESSURE 1-2 TIMES A DAY. 11/20/18   Lorretta Harp, MD  carvedilol (COREG) 25 MG tablet Take 1 tablet (25 mg total) by mouth 2 (two) times daily. 03/04/19   Colon Branch, MD  chlorthalidone (HYGROTON) 25 MG tablet Take 1.5 tablets (37.5 mg total) by mouth daily. 05/14/19   Colon Branch, MD  Cholecalciferol (VITAMIN D3) 400 units tablet Take 400 Units by mouth daily.    [provider]  EPINEPHrine 0.3 mg/0.3 mL IJ SOAJ injection Inject 0.3 mg into the muscle once.    [provider]  meloxicam (MOBIC) 7.5 MG tablet Take 1 tablet (7.5 mg total) by mouth daily. 06/02/19   Tasia Catchings, Mirelle Biskup V, PA-C  potassium chloride (KLOR-CON) 10 MEQ  tablet Take 2 tablets (20 mEq total) by mouth daily. 05/14/19   Wanda Plump, MD  valsartan (DIOVAN) 320 MG tablet Take 1 tablet (320 mg total) by mouth daily. 04/03/19   Runell Gess, MD  zolpidem (AMBIEN) 10 MG tablet TAKE 1 TABLET BY MOUTH AT BEDTIME AS NEEDED FOR SLEEP. 05/08/19   Wanda Plump, MD    Family History Family History  Problem Relation Age of Onset  . Hypertension Mother   . Diabetes Mother   . Arthritis Mother   . Gout Mother   . Hypertension Father   . Diabetes Father        GP  . Hypertension Other        GP  . CAD Maternal Grandmother   . Stroke Neg Hx   . Colon cancer Neg Hx   . Breast cancer Neg Hx     Social History Social History   Tobacco Use  . Smoking status: Never Smoker  . Smokeless tobacco: Never Used  Substance Use Topics  . Alcohol use: Yes    Comment: SOCIAL   . Drug use: No    Comment: no marijuana ;lately      Allergies   Soy allergy, Citrus, Codeine, and Pollen extract   Review of Systems Review of Systems  Reason unable to perform ROS: See HPI as above.     Physical  Exam Triage Vital Signs ED Triage Vitals [06/02/19 0918]  Enc Vitals Group     BP (!) 166/99     Pulse Rate 87     Resp 16     Temp 98.3 F (36.8 C)     Temp Source Oral     SpO2 97 %     Weight      Height      Head Circumference      Peak Flow      Pain Score 9     Pain Loc      Pain Edu?      Excl. in GC?    No data found.  Updated Vital Signs BP (!) 166/99 (BP Location: Left Arm)   Pulse 87   Temp 98.3 F (36.8 C) (Oral)   Resp 16   SpO2 97%   Physical Exam Constitutional:      General: She is not in acute distress.    Appearance: Normal appearance. She is well-developed. She is not toxic-appearing or diaphoretic.  HENT:     Head: Normocephalic and atraumatic.  Eyes:     Conjunctiva/sclera: Conjunctivae normal.     Pupils: Pupils are equal, round, and reactive to light.  Cardiovascular:     Rate and Rhythm: Normal rate and regular rhythm.  Pulmonary:     Effort: Pulmonary effort is normal. No respiratory distress.     Comments: Speaking in full sentences without difficulty. LCTAB Musculoskeletal:     Cervical back: Normal range of motion and neck supple.     Comments: Wrist: No swelling, erythema, warmth, contusion.  Abrasion to the left palm, bleeding controlled.  Tenderness to palpation of ulnar aspect of the wrist.  Full range of motion of wrist, with worsening pain during supination and pronation.  Strength 4/5 LUE. NVI  Back: no erythema, warmth, contusion, wounds.  No tenderness to palpation of the spinous processes, bilateral back.  Full range of motion of back.  Knee: Right knee swelling without erythema or warmth.  Small abrasion to left knee, bleeding controlled.  No erythema, warmth.  Diffuse tenderness to palpation of the right knee.  Full range of motion. Strength 5/5 BLE. NVI   Skin:    General: Skin is warm and dry.  Neurological:     Mental Status: She is alert and oriented to person, place, and time.      UC Treatments / Results   Labs (all labs ordered are listed, but only abnormal results are displayed) Labs Reviewed - No data to display  EKG   Radiology DG Wrist Complete Left  Result Date: 06/02/2019 CLINICAL DATA:  Fall with left wrist pain.  Initial encounter. EXAM: LEFT WRIST - COMPLETE 3+ VIEW COMPARISON:  None. FINDINGS: There is no evidence of fracture or dislocation. There is no evidence of arthropathy or other focal bone abnormality. Soft tissues are unremarkable. IMPRESSION: Negative. Electronically Signed   By: Marnee Spring M.D.   On: 06/02/2019 09:53    Procedures Procedures (including critical care time)  Medications Ordered in UC Medications - No data to display  Initial Impression / Assessment and Plan / UC Course  I have reviewed the triage vital signs and the nursing notes.  Pertinent labs & imaging results that were available during my care of the patient were reviewed by me and considered in my medical decision making (see chart for details).    Left wrist x-ray negative for fracture or dislocation.  Right knee with swelling, able to ambulate, low suspicion for fractures.  Will provide right knee brace, left wrist brace for symptomatic management.  NSAIDs, ice compress, rest.  Return cautions given.  Patient expresses understanding and agrees to plan.  Final Clinical Impressions(s) / UC Diagnoses   Final diagnoses:  Acute pain of right knee  Left wrist pain  Acute bilateral low back pain without sciatica  Fall, initial encounter   ED Prescriptions    Medication Sig Dispense Auth. Provider   meloxicam (MOBIC) 7.5 MG tablet Take 1 tablet (7.5 mg total) by mouth daily. 15 tablet Sara Fisher, PA-C     PDMP not reviewed this encounter.   Sara Fisher, PA-C 06/02/19 1035

## 2019-06-02 NOTE — Discharge Instructions (Signed)
X-ray negative for fracture or dislocation. Start Mobic. Do not take ibuprofen (motrin/advil)/ naproxen (aleve) while on mobic. Ice compress to the knee and wrist. This may take a few weeks to completely resolve, but should be feeling better each week. If pain not improved after 1 week despite medicine, follow up with orthopedics for further evaluation and management needed.

## 2019-06-02 NOTE — ED Triage Notes (Signed)
Pt states fell on crete yesterday evening around 7p when running after her grandchild. Pt c/o lt wrist, rt knee, lower back pain and abrasions to the palm of lt hand and lt knee. Denies loc.

## 2019-06-03 ENCOUNTER — Telehealth: Payer: Self-pay

## 2019-06-03 DIAGNOSIS — Z0279 Encounter for issue of other medical certificate: Secondary | ICD-10-CM

## 2019-06-03 NOTE — Telephone Encounter (Signed)
FMLA completed and faxed back to [phone]. Form sent for scanning.

## 2019-06-10 ENCOUNTER — Telehealth: Payer: Self-pay | Admitting: Internal Medicine

## 2019-06-11 MED FILL — ZOLPIDEM TARTRATE 10 MG TAB: 10 | 30 days supply | Qty: 30 | Fill #0

## 2019-06-11 MED FILL — POTASSIUM CL ER 10 MEQ TAB: 10 | 30 days supply | Qty: 60 | Fill #0

## 2019-06-11 NOTE — Telephone Encounter (Signed)
Ambien refill.   Last OV: 05/14/2019 Last Fill: 05/08/2019 #30 AND 0rf Pt sig: 1 tab qhs prn UDS: Ambien only

## 2019-06-11 NOTE — Telephone Encounter (Signed)
PDMP okay, prescription sent 

## 2019-06-11 NOTE — Addendum Note (Signed)
Addended by: Mervin Kung A on: 06/11/2019 09:18 AM   Modules accepted: Orders

## 2019-07-10 ENCOUNTER — Encounter: Payer: 59 | Admitting: Family Medicine

## 2019-07-10 DIAGNOSIS — Z01419 Encounter for gynecological examination (general) (routine) without abnormal findings: Secondary | ICD-10-CM

## 2019-07-16 MED FILL — AMLODIPINE BESYLATE 10 MG T: 10 | 30 days supply | Qty: 30 | Fill #2

## 2019-07-16 MED FILL — ZOLPIDEM TARTRATE 10 MG TAB: 10 | 30 days supply | Qty: 30 | Fill #1

## 2019-08-11 MED FILL — CARVEDILOL 25 MG TABLET: 25 | 30 days supply | Qty: 60 | Fill #1

## 2019-08-11 MED FILL — POTASSIUM CL ER 10 MEQ TAB: 10 | 30 days supply | Qty: 60 | Fill #1

## 2019-08-11 MED FILL — AMLODIPINE BESYLATE 10 MG T: 10 | 30 days supply | Qty: 30 | Fill #3

## 2019-08-13 MED FILL — ZOLPIDEM TARTRATE 10 MG TAB: 10 | 30 days supply | Qty: 30 | Fill #2

## 2019-08-22 ENCOUNTER — Telehealth: Payer: Self-pay

## 2019-08-22 NOTE — Telephone Encounter (Signed)
Received notification/form from Wilder Glade at [employer] [facility] reporting excessive absences. FMLA written for 8 events per 12 month for a duration of 4 hours per episode. Pt has reported absences to [employer] on:   09/23/2018 8 hours 09/24/2018 8 hours 09/25/2018 8 hours 09/26/2018 8 hours 10/02/2018 8 hours 10/08/2018 8 hours 10/09/2018 8 hours 10/10/2018 8 hours 10/12/2018 8 hours 10/13/2018 8 hours 10/14/2018 8 hours 10/16/2018 8 hours 11/10/2018 8 hours 12/22/2018 8 hours 02/19/2019 8 hours 03/12/2019 8 hours 04/08/2019 8 hours 06/02/2019 8 hours 06/03/2019 8 hours 06/04/2019 8 hours 08/07/2019 8 hours 08/13/2019 8 hours  They are asking if Pt's health condition has significantly changed and if these absences are consistent w/ her condition(s). She is requesting response by 08/28/2019.

## 2019-08-22 NOTE — Telephone Encounter (Signed)
Received a form from her work, they report excessive use of absences. She has not have any change in her clinical status to my knowledge. Paperwork completed [place], she is overdue for a visit, please arrange.

## 2019-08-22 NOTE — Telephone Encounter (Signed)
Received fax confirmation

## 2019-08-22 NOTE — Telephone Encounter (Signed)
Allowing 8 episodes per year and a duration of 4 hours for each episode. For appts- allowing once per month for a duration of 4 hours for each episode. Form faxed back to [employer] at [phone]. Form sent for scanning.

## 2019-08-25 ENCOUNTER — Other Ambulatory Visit: Payer: Self-pay | Admitting: Internal Medicine

## 2019-08-25 DIAGNOSIS — I10 Essential (primary) hypertension: Secondary | ICD-10-CM

## 2019-08-25 MED FILL — POTASSIUM CL ER 10 MEQ TAB: 10 | 30 days supply | Qty: 60 | Fill #1

## 2019-08-25 MED FILL — ZOLPIDEM TARTRATE 10 MG TAB: 10 | 30 days supply | Qty: 30 | Fill #2

## 2019-08-25 MED FILL — AMLODIPINE BESYLATE 10 MG T: 10 | 30 days supply | Qty: 30 | Fill #3

## 2019-08-26 MED FILL — CARVEDILOL 25 MG TABLET: 25 | 30 days supply | Qty: 60 | Fill #0

## 2019-08-28 ENCOUNTER — Encounter: Payer: Self-pay | Admitting: Internal Medicine

## 2019-09-23 ENCOUNTER — Encounter: Payer: 59 | Admitting: Internal Medicine

## 2019-10-01 IMAGING — CT CT HEAD W/O CM
3 of 4 series · 15 of 47 positions shown, 18 images · non-contrast
Comparison: MRI and CT 01/10/2008

CLINICAL DATA: Near syncope.  Hypertension.

EXAM:
CT HEAD WITHOUT CONTRAST
TECHNIQUE: Contiguous axial images were obtained from the base of the skull
through the vertex without intravenous contrast.

[Series 2: head wo · axial · 0.47mm/px · z∈[-122,-2]mm · 9 of 31 slices shown, 12 images]
[im 4/31  brain]
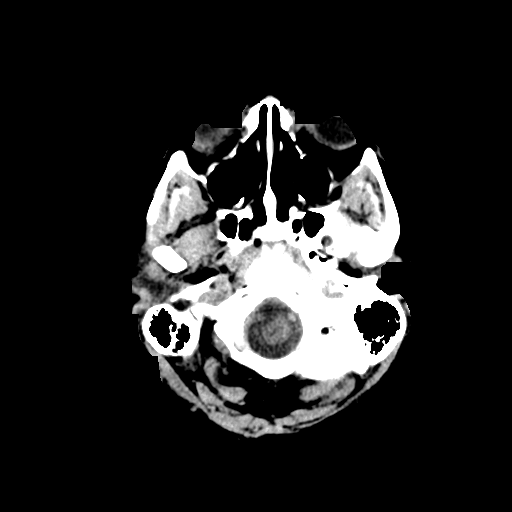
[im 4/31  bone]
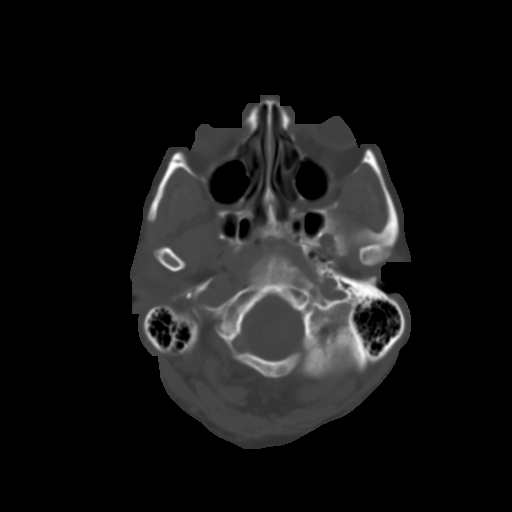
[im 7/31  brain]
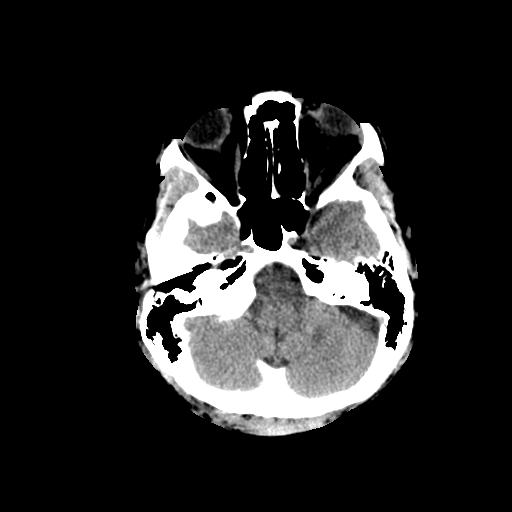
[im 10/31  brain]
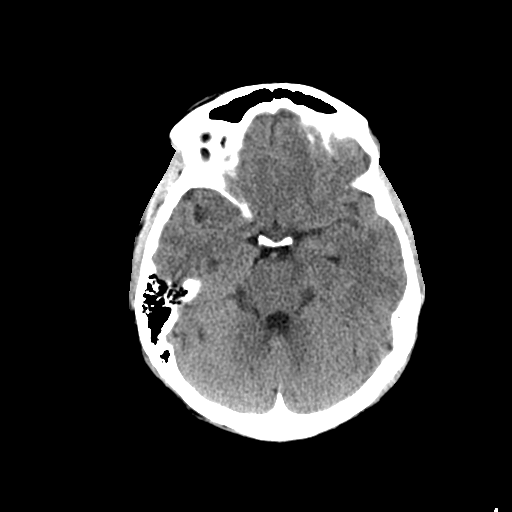
[im 13/31  brain]
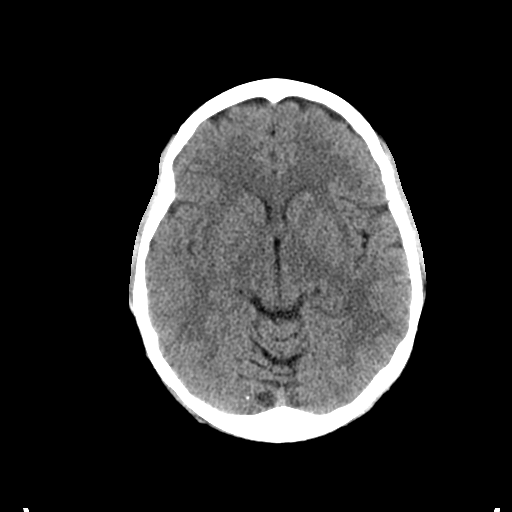
[im 16/31  brain]
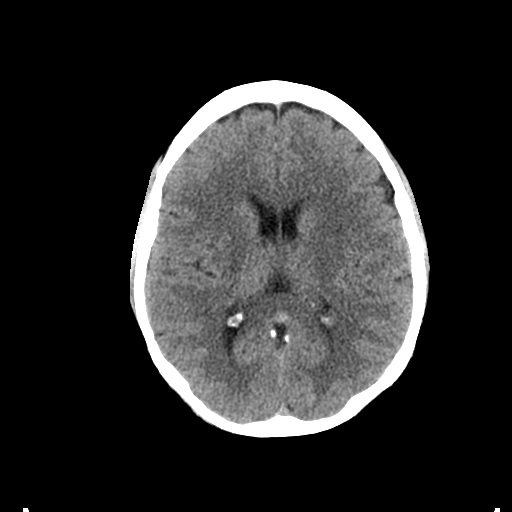
[im 16/31  bone]
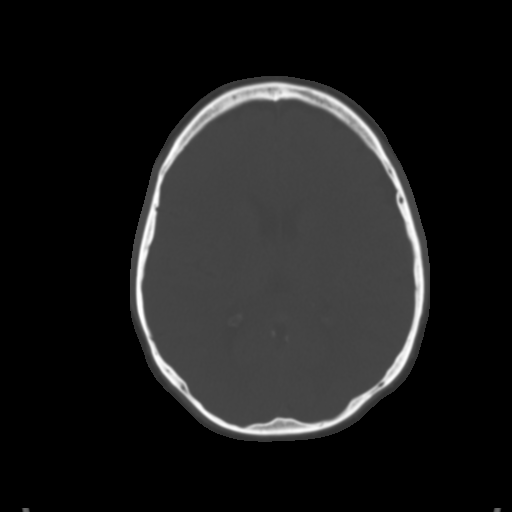
[im 19/31  brain]
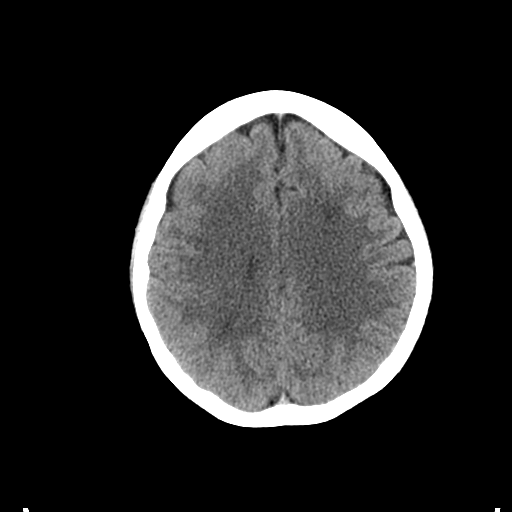
[im 22/31  brain]
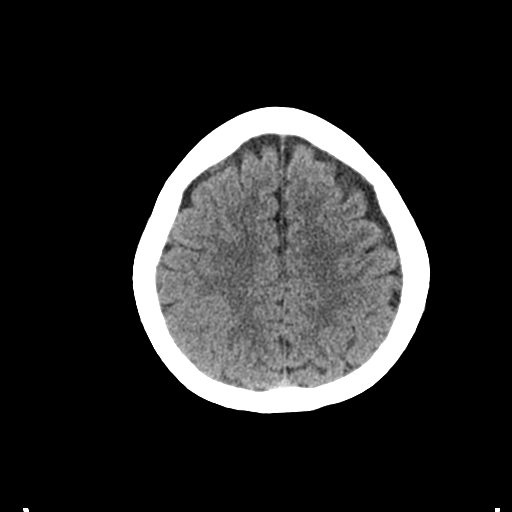
[im 25/31  brain]
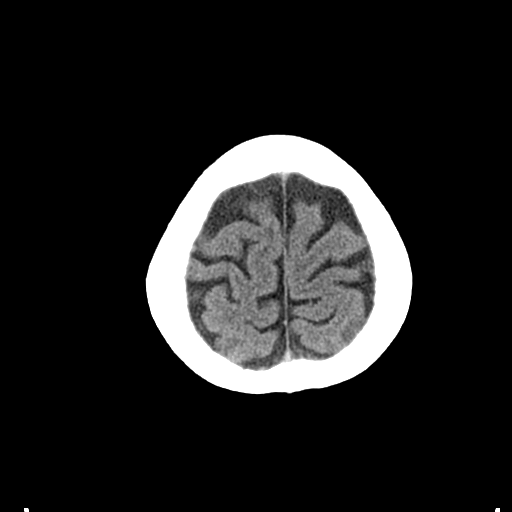
[im 28/31  brain]
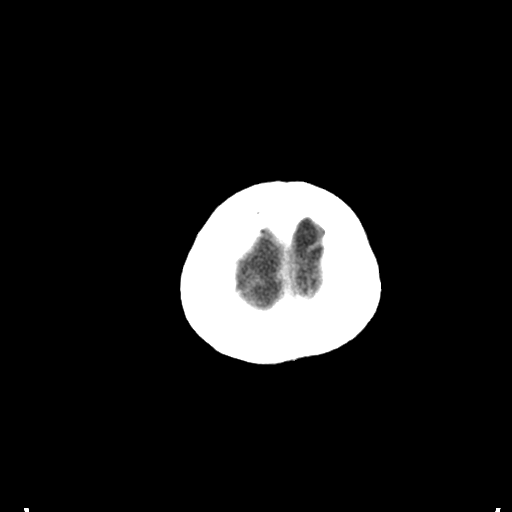
[im 28/31  bone]
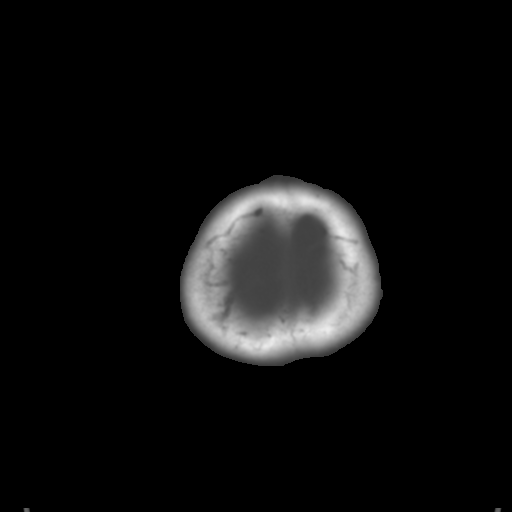

[Series 5: coronal soft tissue · coronal · 0.30mm/px · 3 of 66 slices shown]
[im 22/66  brain]
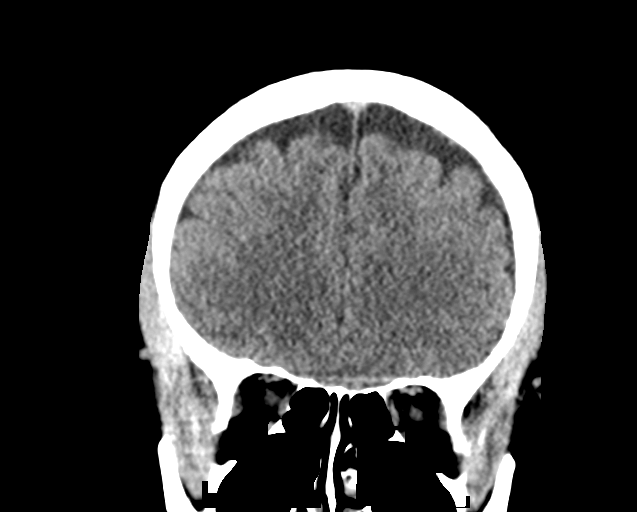
[im 29/66  brain]
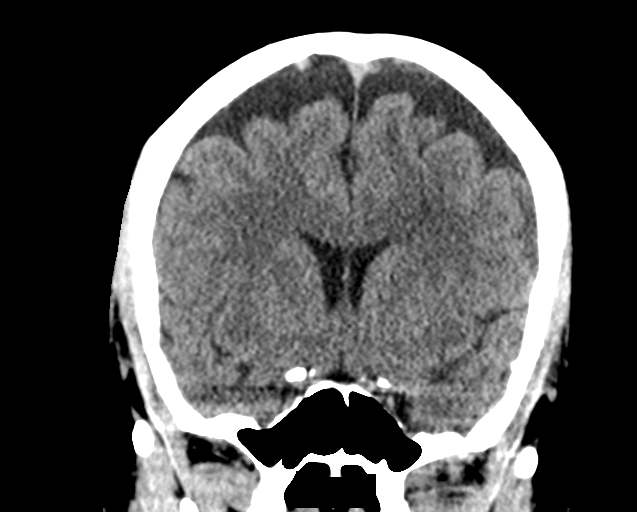
[im 37/66  brain]
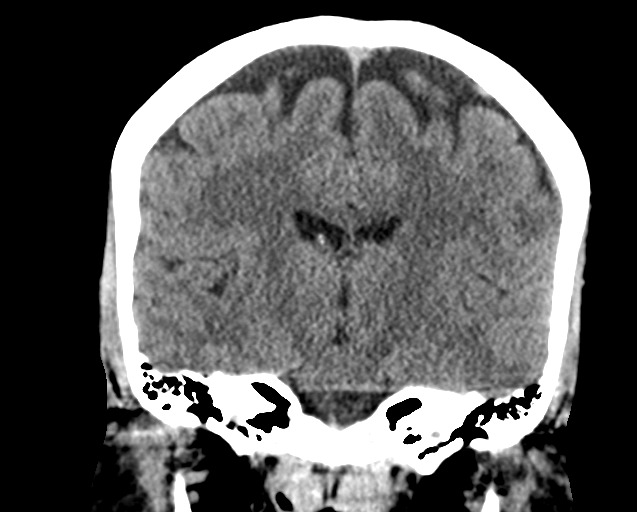

[Series 6: sagittal soft tissue · sagittal · 0.30mm/px · 3 of 60 slices shown]
[im 20/60  brain]
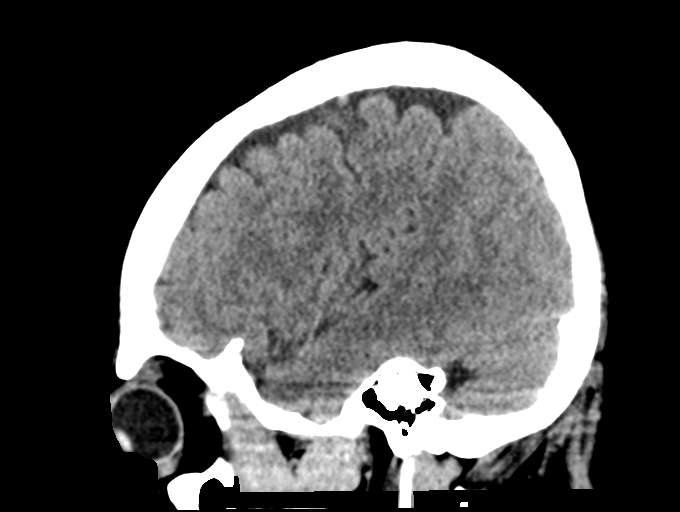
[im 30/60  brain]
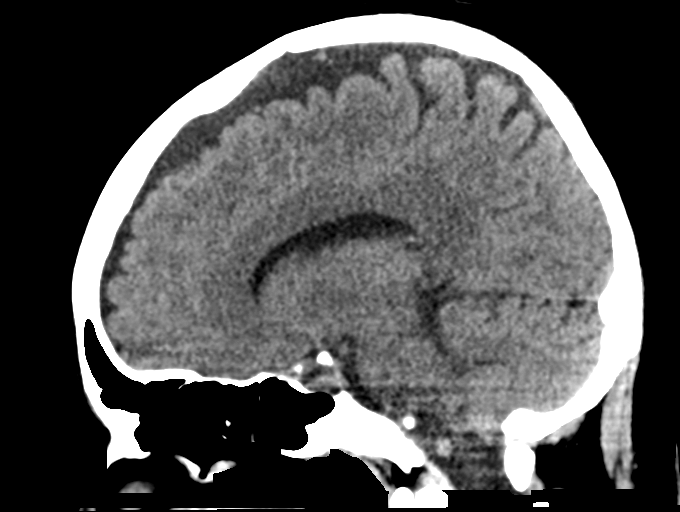
[im 40/60  brain]
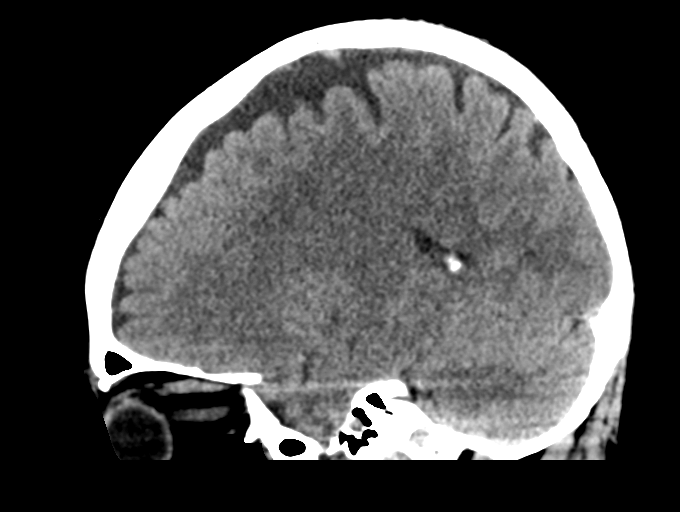

[15 of 47 positions shown; findings below may reference images not displayed]

FINDINGS: Brain: No acute intracranial abnormality. Specifically, no
hemorrhage, hydrocephalus, mass lesion, acute infarction, or
significant intracranial injury.

Vascular: No hyperdense vessel or unexpected calcification.

Skull: No acute calvarial abnormality.

Sinuses/Orbits: Visualized paranasal sinuses and mastoids clear.
Orbital soft tissues unremarkable.

Other: None
IMPRESSION: No acute intracranial abnormality.

## 2019-10-02 DIAGNOSIS — H5213 Myopia, bilateral: Secondary | ICD-10-CM | POA: Diagnosis not present

## 2019-10-06 ENCOUNTER — Telehealth: Payer: Self-pay | Admitting: Internal Medicine

## 2019-10-06 ENCOUNTER — Other Ambulatory Visit: Payer: Self-pay | Admitting: Internal Medicine

## 2019-10-06 MED FILL — CARVEDILOL 25 MG TABLET: 25 | 30 days supply | Qty: 60 | Fill #0

## 2019-10-06 MED FILL — AMLODIPINE BESYLATE 10 MG T: 10 | 30 days supply | Qty: 30 | Fill #0

## 2019-10-06 MED FILL — ZOLPIDEM TARTRATE 10 MG TAB: 10 | 30 days supply | Qty: 30 | Fill #0

## 2019-10-06 NOTE — Telephone Encounter (Signed)
Ambien refill.   Last OV:  05/14/2019, appt scheduled 10/13/2019 Last Fill: 06/11/2019 #30 and 2RF Pt sig: 1 tab qhs prn UDS: Ambien only

## 2019-10-06 NOTE — Telephone Encounter (Signed)
Prescription sent

## 2019-10-13 ENCOUNTER — Ambulatory Visit: Payer: 59 | Admitting: Internal Medicine

## 2019-10-13 ENCOUNTER — Encounter: Payer: Self-pay | Admitting: Internal Medicine

## 2019-10-13 ENCOUNTER — Other Ambulatory Visit: Payer: Self-pay

## 2019-10-13 VITALS — BP 138/84 | HR 74 | Temp 98.4°F | Resp 16 | Ht 68.0 in | Wt 215.4 lb

## 2019-10-13 DIAGNOSIS — R739 Hyperglycemia, unspecified: Secondary | ICD-10-CM

## 2019-10-13 DIAGNOSIS — E785 Hyperlipidemia, unspecified: Secondary | ICD-10-CM

## 2019-10-13 DIAGNOSIS — I10 Essential (primary) hypertension: Secondary | ICD-10-CM | POA: Diagnosis not present

## 2019-10-13 NOTE — Progress Notes (Signed)
Pre visit review using our clinic review tool, if applicable. No additional management support is needed unless otherwise documented below in the visit note. 

## 2019-10-13 NOTE — Progress Notes (Signed)
Subjective:    Patient ID: Sara Norris, female    DOB: Jul 30, 1965, 54 y.o.   MRN: [id number]  DOS:  10/13/2019 Type of visit - description:  f/u Since the last office visit is doing well, reports good med compliance.   Review of Systems Denies chest pain or difficulty breathing.  No edema.  No headaches.   Past Medical History:  Diagnosis Date  . Allergy    seasonal  . Chronic knee pain   . Colonic diverticular abscess   . Contraception    menopausal  . Diverticulosis   . Hiatal hernia    per pt, not aware of this.  . Hypertension    dx age 38  . Menopause    LMP age 89  . Umbilical hernia    per pt/ nopt aware of this.    Past Surgical History:  Procedure Laterality Date  . CHOLECYSTECTOMY    . MYOMECTOMY     removed fibroids/ when she was in her 30"s  . TONSILLECTOMY      Allergies as of 10/13/2019      Reactions   Soy Allergy    Itching of throat.   Citrus Rash   Codeine Rash   Pollen Extract    sneezing      Medication List       Accurate as of October 13, 2019  8:05 PM. If you have any questions, ask your nurse or doctor.        STOP taking these medications   meloxicam 7.5 MG tablet Commonly known as: Mobic Stopped by: Willow Ora, MD     TAKE these medications   amLODipine 10 MG tablet Commonly known as: NORVASC Take 1 tablet (10 mg total) by mouth daily.   aspirin EC 81 MG tablet Take 81 mg by mouth daily.   Blood Pressure Cuff Misc 1 Product by Does not apply route daily. MONITOR YOUR BLOOD PRESSURE 1-2 TIMES A DAY.   carvedilol 25 MG tablet Commonly known as: COREG Take 1 tablet (25 mg total) by mouth 2 (two) times daily with a meal.   chlorthalidone 25 MG tablet Commonly known as: HYGROTON Take 1.5 tablets (37.5 mg total) by mouth daily.   EPINEPHrine 0.3 mg/0.3 mL Soaj injection Commonly known as: EPI-PEN Inject 0.3 mg into the muscle once.   potassium chloride 10 MEQ tablet Commonly known as: KLOR-CON Take 2 tablets  (20 mEq total) by mouth daily.   valsartan 320 MG tablet Commonly known as: DIOVAN Take 1 tablet (320 mg total) by mouth daily.   Vitamin D3 10 MCG (400 UNIT) tablet Take 400 Units by mouth daily.   zolpidem 10 MG tablet Commonly known as: AMBIEN TAKE 1 TABLET BY MOUTH AT BEDTIME AS NEEDED FOR SLEEP.          Objective:   Physical Exam BP 138/84 (BP Location: Left Arm, Patient Position: Sitting, Cuff Size: Normal)   Pulse 74   Temp 98.4 F (36.9 C) (Oral)   Resp 16   Ht 5\' 8"  (1.727 m)   Wt 215 lb 6 oz (97.7 kg)   SpO2 96%   BMI 32.75 kg/m  General:   Well developed, NAD, BMI noted. HEENT:  Normocephalic . Face symmetric, atraumatic Lungs:  CTA B Normal respiratory effort, no intercostal retractions, no accessory muscle use. Heart: RRR,  no murmur.  Lower extremities: no pretibial edema bilaterally  Skin: Not pale. Not jaundice Neurologic:  alert & oriented X3.  Speech normal,  gait appropriate for age and unassisted Psych--  Cognition and judgment appear intact.  Cooperative with normal attention span and concentration.  Behavior appropriate. No anxious or depressed appearing.      Assessment     ASSESSMENT Prediabetes: A1c 6.1 08-2014 HTN DX age 44 Insomnia - on ambien Early menopause - declines HRT H/o Knee pain Diverticulitis, 1st episode 02-2015, + perf, conservative Rx  History of food allergies, citrus?. Has an EpiPen  PLAN: Prediabetes: Check A1c HTN: On amlodipine, carvedilol, chlorthalidone, potassium, Diovan.  BP today is very good, reports ambulatory BPs are good as well. Encouraged to stay active, low-salt diet, continue checking BPs, check a BMP, FLP, CBC. Insomnia: On Ambien. OSA: Not treated, again discussed benefit of CPAP encouraged to get nasal pillow which hopefully she'll tolerate.   Knee pain: Not taking NSAIDs, recommend Tylenol/ice if needed. RTC 4 months    This visit occurred during the SARS-CoV-2 public health emergency.   Safety protocols were in place, including screening questions prior to the visit, additional usage of staff PPE, and extensive cleaning of exam room while observing appropriate contact time as indicated for disinfecting solutions.

## 2019-10-13 NOTE — Assessment & Plan Note (Signed)
Prediabetes: Check A1c HTN: On amlodipine, carvedilol, chlorthalidone, potassium, Diovan.  BP today is very good, reports ambulatory BPs are good as well. Encouraged to stay active, low-salt diet, continue checking BPs, check a BMP, FLP, CBC. Insomnia: On Ambien. OSA: Not treated, again discussed benefit of CPAP encouraged to get nasal pillow which hopefully she'll tolerate.   Knee pain: Not taking NSAIDs, recommend Tylenol/ice if needed. RTC 4 months

## 2019-10-13 NOTE — Patient Instructions (Addendum)
Continue checking your blood pressure regularly  BP GOAL is between 110/65 and  135/85. If it is consistently higher or lower, let me know  See about getting a CPAP you can use   GO TO THE LAB : Get the blood work     GO TO THE FRONT DESK, PLEASE SCHEDULE YOUR APPOINTMENTS Come back for a checkup in 4 months

## 2019-10-14 LAB — CBC WITH DIFFERENTIAL/PLATELET
Absolute Monocytes: 432 cells/uL (ref 200–950)
Basophils Absolute: 101 cells/uL (ref 0–200)
Basophils Relative: 2.1 %
Eosinophils Absolute: 302 cells/uL (ref 15–500)
Eosinophils Relative: 6.3 %
HCT: 39 % (ref 35.0–45.0)
Hemoglobin: 13.3 g/dL (ref 11.7–15.5)
Lymphs Abs: 2093 cells/uL (ref 850–3900)
MCH: 31.1 pg (ref 27.0–33.0)
MCHC: 34.1 g/dL (ref 32.0–36.0)
MCV: 91.1 fL (ref 80.0–100.0)
MPV: 10.3 fL (ref 7.5–12.5)
Monocytes Relative: 9 %
Neutro Abs: 1872 cells/uL (ref 1500–7800)
Neutrophils Relative %: 39 %
Platelets: 389 10*3/uL (ref 140–400)
RBC: 4.28 10*6/uL (ref 3.80–5.10)
RDW: 12.7 % (ref 11.0–15.0)
Total Lymphocyte: 43.6 %
WBC: 4.8 10*3/uL (ref 3.8–10.8)

## 2019-10-14 LAB — BASIC METABOLIC PANEL
BUN: 17 mg/dL (ref 7–25)
CO2: 28 mmol/L (ref 20–32)
Calcium: 9.4 mg/dL (ref 8.6–10.4)
Chloride: 102 mmol/L (ref 98–110)
Creat: 0.79 mg/dL (ref 0.50–1.05)
Glucose, Bld: 109 mg/dL — ABNORMAL HIGH (ref 65–99)
Potassium: 4.2 mmol/L (ref 3.5–5.3)
Sodium: 138 mmol/L (ref 135–146)

## 2019-10-14 LAB — HEMOGLOBIN A1C
Hgb A1c MFr Bld: 6.1 % of total Hgb — ABNORMAL HIGH (ref <5.7)
Mean Plasma Glucose: 128 (calc)
eAG (mmol/L): 7.1 (calc)

## 2019-10-14 LAB — LIPID PANEL
Cholesterol: 237 mg/dL — ABNORMAL HIGH (ref <200)
HDL: 59 mg/dL (ref >=50)
LDL Cholesterol (Calc): 154 mg/dL (calc) — ABNORMAL HIGH
Non-HDL Cholesterol (Calc): 178 mg/dL (calc) — ABNORMAL HIGH (ref <130)
Total CHOL/HDL Ratio: 4 (calc) (ref <5.0)
Triglycerides: 121 mg/dL (ref <150)

## 2019-10-17 MED ORDER — ATORVASTATIN CALCIUM 20 MG PO TABS
20.0000 mg | ORAL_TABLET | Freq: Every day | ORAL | 3 refills | Status: DC
Start: 2019-10-17 — End: 2019-12-29

## 2019-10-17 MED FILL — ATORVASTATIN CALCIUM 20 MG: 20 | 30 days supply | Qty: 30 | Fill #0

## 2019-10-17 NOTE — Addendum Note (Signed)
Addended byConrad [place] D on: 10/17/2019 08:01 AM   Modules accepted: Orders

## 2019-11-10 ENCOUNTER — Telehealth: Payer: Self-pay

## 2019-11-10 NOTE — Telephone Encounter (Signed)
Received FMLA from [employer]- 1 year f/u. Forms completed and placed in PCP red folder for review/sign.

## 2019-11-12 DIAGNOSIS — Z0279 Encounter for issue of other medical certificate: Secondary | ICD-10-CM

## 2019-11-12 NOTE — Telephone Encounter (Signed)
Received fax confirmation

## 2019-11-12 NOTE — Telephone Encounter (Signed)
Faxed back to [employer] at [phone]. Form sent for scanning.

## 2019-11-12 NOTE — Telephone Encounter (Signed)
signed

## 2019-11-19 NOTE — Telephone Encounter (Signed)
Caller:[phone]  Patient states on her FMLA paperwork you stated she could be out for 4 hrs if needed. Patient states she work an 8 hours shift. If for any reason she needs to be out FMLA will only cover for 4 hours. She is wondering if it could be changed to 8 hours to avoid getting any work points.

## 2019-11-19 NOTE — Telephone Encounter (Signed)
Spoke w/ Pt- informed that we completed paperwork for 4 hours intermittent for her doctors appts, informed if she has to miss work for 8+ hours she would need to let [employer] know and they would send [place] additional forms. Pt verbalized understanding.

## 2019-11-24 ENCOUNTER — Other Ambulatory Visit: Payer: Self-pay | Admitting: Internal Medicine

## 2019-11-24 ENCOUNTER — Telehealth: Payer: Self-pay | Admitting: Internal Medicine

## 2019-11-24 MED FILL — ZOLPIDEM TARTRATE 10 MG TAB: 10 | 30 days supply | Qty: 30 | Fill #0

## 2019-11-24 NOTE — Telephone Encounter (Signed)
Requesting: Ambien 10mg  Contract: None UDS: None Last Visit: 10/13/2019 Next Visit: None scheduled Last Refill: 10/06/2019 #30 and 0RF Pt sig: 1 tab qhs prn  Please Advise

## 2019-11-24 NOTE — Telephone Encounter (Signed)
PDMP okay, next visit should be around December 2021, RF sent.

## 2019-12-15 NOTE — Telephone Encounter (Signed)
Patient states she was out today because of high blood pressure. She states new form has to send to [employer].

## 2019-12-15 NOTE — Telephone Encounter (Signed)
Will need a new form sent to [place]- and she will need an appt with a provider to document the high bp.

## 2019-12-18 NOTE — Telephone Encounter (Signed)
Message was given to patient, appt scheduled

## 2019-12-19 ENCOUNTER — Ambulatory Visit: Payer: 59 | Admitting: Internal Medicine

## 2019-12-24 ENCOUNTER — Ambulatory Visit: Payer: 59 | Admitting: Internal Medicine

## 2019-12-29 ENCOUNTER — Ambulatory Visit (INDEPENDENT_AMBULATORY_CARE_PROVIDER_SITE_OTHER): Payer: 59 | Admitting: Internal Medicine

## 2019-12-29 ENCOUNTER — Other Ambulatory Visit: Payer: Self-pay

## 2019-12-29 ENCOUNTER — Encounter: Payer: Self-pay | Admitting: Internal Medicine

## 2019-12-29 VITALS — BP 151/94 | HR 74 | Temp 98.2°F | Resp 18 | Ht 68.0 in | Wt 214.2 lb

## 2019-12-29 DIAGNOSIS — E785 Hyperlipidemia, unspecified: Secondary | ICD-10-CM

## 2019-12-29 DIAGNOSIS — I10 Essential (primary) hypertension: Secondary | ICD-10-CM

## 2019-12-29 DIAGNOSIS — R739 Hyperglycemia, unspecified: Secondary | ICD-10-CM

## 2019-12-29 NOTE — Patient Instructions (Addendum)
You are due for a pap smear. Please make an appointment with your gynecologist at your earliest convenience.   Check the  blood pressure 2  times a week BP GOAL is between 110/65 and  135/85. If it is consistently higher or lower, let me know   At your convenience go to the basement at our [facility]  location and get your blood work, fasting   GO TO THE FRONT DESK, PLEASE SCHEDULE YOUR APPOINTMENTS Come back for a physical exam in 3 months

## 2019-12-29 NOTE — Progress Notes (Signed)
Subjective:    Patient ID: Sara Norris, female    DOB: 12-Mar-1965, 54 y.o.   MRN: [id number]  DOS:  12/29/2019 Type of visit - description: Follow-up Hyperlipidemia: Since the last visit, she did not start Lipitor HTN: Reports good compliance with medications  States that has changed her diet, trying to eat less sweets- fats.  Wt Readings from Last 3 Encounters:  12/29/19 214 lb 4 oz (97.2 kg)  10/13/19 215 lb 6 oz (97.7 kg)  05/14/19 213 lb 4 oz (96.7 kg)     Review of Systems See above   Past Medical History:  Diagnosis Date  . Allergy    seasonal  . Chronic knee pain   . Colonic diverticular abscess   . Contraception    menopausal  . Diverticulosis   . Hiatal hernia    per pt, not aware of this.  . Hypertension    dx age 31  . Menopause    LMP age 43  . Umbilical hernia    per pt/ nopt aware of this.    Past Surgical History:  Procedure Laterality Date  . CHOLECYSTECTOMY    . MYOMECTOMY     removed fibroids/ when she was in her 30"s  . TONSILLECTOMY      Allergies as of 12/29/2019      Reactions   Soy Allergy    Itching of throat.   Citrus Rash   Codeine Rash   Pollen Extract    sneezing      Medication List       Accurate as of December 29, 2019 11:59 PM. If you have any questions, ask your nurse or doctor.        STOP taking these medications   atorvastatin 20 MG tablet Commonly known as: LIPITOR Stopped by: Willow Ora, MD     TAKE these medications   amLODipine 10 MG tablet Commonly known as: NORVASC Take 1 tablet (10 mg total) by mouth daily.   aspirin EC 81 MG tablet Take 81 mg by mouth daily.   Blood Pressure Cuff Misc 1 Product by Does not apply route daily. MONITOR YOUR BLOOD PRESSURE 1-2 TIMES A DAY.   carvedilol 25 MG tablet Commonly known as: COREG Take 1 tablet (25 mg total) by mouth 2 (two) times daily with a meal.   chlorthalidone 25 MG tablet Commonly known as: HYGROTON Take 1.5 tablets (37.5 mg total) by mouth  daily.   EPINEPHrine 0.3 mg/0.3 mL Soaj injection Commonly known as: EPI-PEN Inject 0.3 mg into the muscle once.   potassium chloride 10 MEQ tablet Commonly known as: KLOR-CON Take 2 tablets (20 mEq total) by mouth daily.   valsartan 320 MG tablet Commonly known as: DIOVAN Take 1 tablet (320 mg total) by mouth daily.   Vitamin D3 10 MCG (400 UNIT) tablet Take 400 Units by mouth daily.   zolpidem 10 MG tablet Commonly known as: AMBIEN TAKE 1 TABLET BY MOUTH AT BEDTIME AS NEEDED FOR SLEEP.          Objective:   Physical Exam BP (!) 151/94 (BP Location: Left Arm, Patient Position: Sitting, Cuff Size: Normal)   Pulse 74   Temp 98.2 F (36.8 C) (Oral)   Resp 18   Ht 5\' 8"  (1.727 m)   Wt 214 lb 4 oz (97.2 kg)   SpO2 99%   BMI 32.58 kg/m  General:   Well developed, NAD, BMI noted. HEENT:  Normocephalic . Face symmetric, atraumatic Lungs:  CTA B Normal respiratory effort, no intercostal retractions, no accessory muscle use. Heart: RRR,  no murmur.  Lower extremities: no pretibial edema bilaterally  Skin: Not pale. Not jaundice Neurologic:  alert & oriented X3.  Speech normal, gait appropriate for age and unassisted Psych--  Cognition and judgment appear intact.  Cooperative with normal attention span and concentration.  Behavior appropriate. No anxious or depressed appearing.      Assessment      ASSESSMENT Prediabetes: A1c 6.1 08-2014 HTN DX age 71 Hyperlipidemia: Insomnia - on ambien Early menopause - declines HRT H/o Knee pain Diverticulitis, 1st episode 02-2015, + perf, conservative Rx  History of food allergies, citrus?. Has an EpiPen   PLAN: Prediabetes: Last A1c 6.1.  Eating healthier, praised. HTN: Continue amlodipine, carvedilol, chlorthalidone, potassium, Diovan.  Reports good compliance, BP today slightly elevated, this morning she got a similar reading.  For now no change, continue checking ambulatory BPs.  See AVS Hyperlipidemia: Last LDL  154, recommended Lipitor, reports she did not checked a MyChart message.  In addition, she is very reluctant to take any cholesterol medication reason why she changed her diet.  We will check a FLP (@ [facility] lab). Patient not checking MyChart: Gived the option to stop using mychart and communicate via phone or letter, reports that from this point on she will be sure to use mychart. Reports she had a flu shot, plans to get a Covid booster RTC 3 months CPX    This visit occurred during the SARS-CoV-2 public health emergency.  Safety protocols were in place, including screening questions prior to the visit, additional usage of staff PPE, and extensive cleaning of exam room while observing appropriate contact time as indicated for disinfecting solutions.

## 2019-12-29 NOTE — Progress Notes (Signed)
Pre visit review using our clinic review tool, if applicable. No additional management support is needed unless otherwise documented below in the visit note. 

## 2019-12-30 NOTE — Assessment & Plan Note (Signed)
Prediabetes: Last A1c 6.1.  Eating healthier, praised. HTN: Continue amlodipine, carvedilol, chlorthalidone, potassium, Diovan.  Reports good compliance, BP today slightly elevated, this morning she got a similar reading.  For now no change, continue checking ambulatory BPs.  See AVS Hyperlipidemia: Last LDL 154, recommended Lipitor, reports she did not checked a MyChart message.  In addition, she is very reluctant to take any cholesterol medication reason why she changed her diet.  We will check a FLP (@ [facility]). Patient not checking MyChart: Gived the option to stop using mychart and communicate via phone or letter, reports that from this point on she will be sure to use mychart. Reports she had a flu shot, plans to get a Covid booster RTC 3 months CPX

## 2019-12-31 MED FILL — ZOLPIDEM TARTRATE 10 MG TAB: 10 | 30 days supply | Qty: 30 | Fill #1

## 2020-02-10 ENCOUNTER — Telehealth: Payer: Self-pay | Admitting: Internal Medicine

## 2020-02-10 NOTE — Telephone Encounter (Signed)
Requesting: Ambien 10mg  Contract: None UDS: None Last Visit: 12/29/2019 Next Visit: None Last Refill: 11/24/2019 #30 and 1RF Pt sig: 1 tab qhs prn  Please Advise

## 2020-02-11 ENCOUNTER — Other Ambulatory Visit: Payer: Self-pay | Admitting: Internal Medicine

## 2020-02-11 MED FILL — ZOLPIDEM TARTRATE 10 MG TAB: 10 | 30 days supply | Qty: 30 | Fill #0

## 2020-02-11 NOTE — Telephone Encounter (Signed)
PDMP okay, prescription sent 

## 2020-02-17 ENCOUNTER — Other Ambulatory Visit: Payer: Self-pay | Admitting: Internal Medicine

## 2020-02-17 DIAGNOSIS — I10 Essential (primary) hypertension: Secondary | ICD-10-CM

## 2020-02-17 MED FILL — CARVEDILOL 25 MG TABS: 25 | 90 days supply | Qty: 180 | Fill #0

## 2020-02-17 MED FILL — AMLODIPINE BESYLATE 10 MG T: 10 | 90 days supply | Qty: 90 | Fill #0

## 2020-02-19 ENCOUNTER — Ambulatory Visit: Payer: 59 | Admitting: Internal Medicine

## 2020-02-25 ENCOUNTER — Ambulatory Visit: Payer: 59

## 2020-03-03 MED FILL — AMLODIPINE BESYLATE 10 MG T: 10 | 90 days supply | Qty: 90 | Fill #0

## 2020-03-03 MED FILL — CARVEDILOL 25 MG TABS: 25 | 90 days supply | Qty: 180 | Fill #0

## 2020-04-09 MED FILL — ZOLPIDEM TARTRATE 10 MG TAB: 10 | 30 days supply | Qty: 30 | Fill #1

## 2020-04-29 ENCOUNTER — Encounter: Payer: Self-pay | Admitting: Internal Medicine

## 2020-08-02 ENCOUNTER — Other Ambulatory Visit: Payer: Self-pay | Admitting: Internal Medicine

## 2020-08-02 ENCOUNTER — Other Ambulatory Visit (HOSPITAL_COMMUNITY): Payer: Self-pay

## 2020-08-02 MED ORDER — ZOLPIDEM TARTRATE 10 MG PO TABS
ORAL_TABLET | ORAL | 0 refills | Status: DC
  Filled 2020-08-02: qty 30, 30d supply, fill #0

## 2020-08-02 MED ORDER — ZOLPIDEM TARTRATE 10 MG PO TABS: 10.0000 mg | ORAL_TABLET | Freq: Every evening | ORAL | 0 refills | Status: DC | PRN

## 2020-08-02 MED ORDER — AMLODIPINE BESYLATE 10 MG PO TABS
10.0000 mg | ORAL_TABLET | Freq: Every day | ORAL | 0 refills | Status: DC
Start: 2020-08-02 — End: 2021-08-02
  Filled 2020-08-02 – 2021-03-22 (×2): qty 30, 30d supply, fill #0

## 2020-08-02 NOTE — Telephone Encounter (Signed)
Ambien Rx faxed to [facility]patient pharmacy.

## 2020-08-10 ENCOUNTER — Other Ambulatory Visit (HOSPITAL_COMMUNITY): Payer: Self-pay

## 2020-09-07 ENCOUNTER — Encounter: Payer: Self-pay | Admitting: Internal Medicine

## 2020-09-08 ENCOUNTER — Encounter: Payer: Self-pay | Admitting: Internal Medicine

## 2020-10-19 ENCOUNTER — Ambulatory Visit: Payer: Self-pay | Attending: Internal Medicine

## 2020-10-19 DIAGNOSIS — Z23 Encounter for immunization: Secondary | ICD-10-CM

## 2020-10-19 NOTE — Progress Notes (Signed)
   Covid-19 Vaccination Clinic  Name:  Sara Norris    MRN: [id number] DOB: December 19, 1965  10/19/2020  [name] was observed post Covid-19 immunization for 15 minutes without incident. She was provided with Vaccine Information Sheet and instruction to access the V-Safe system.   [name] was instructed to call 911 with any severe reactions post vaccine: Difficulty breathing  Swelling of face and throat  A fast heartbeat  A bad rash all over body  Dizziness and weakness   Immunizations Administered     Name Date Dose VIS Date Route   PFIZER Comrnaty(Gray TOP) Covid-19 Vaccine 10/19/2020  9:03 AM 0.3 mL 01/29/2020 Intramuscular   Manufacturer: [employer], [employer]   Lot: [id number]   NDC: [phone]

## 2020-11-01 ENCOUNTER — Other Ambulatory Visit (HOSPITAL_BASED_OUTPATIENT_CLINIC_OR_DEPARTMENT_OTHER): Payer: Self-pay

## 2020-11-01 MED ORDER — COVID-19 MRNA VAC-TRIS(PFIZER) 30 MCG/0.3ML IM SUSP
INTRAMUSCULAR | 0 refills | Status: DC
  Filled 2020-11-01: qty 0.3, 1d supply, fill #0

## 2021-03-22 ENCOUNTER — Other Ambulatory Visit: Payer: Self-pay | Admitting: Internal Medicine

## 2021-03-22 ENCOUNTER — Other Ambulatory Visit (HOSPITAL_COMMUNITY): Payer: Self-pay

## 2021-03-22 DIAGNOSIS — I10 Essential (primary) hypertension: Secondary | ICD-10-CM

## 2021-03-23 ENCOUNTER — Other Ambulatory Visit (HOSPITAL_COMMUNITY): Payer: Self-pay

## 2021-04-20 ENCOUNTER — Encounter: Payer: Self-pay | Admitting: Internal Medicine

## 2021-08-02 ENCOUNTER — Other Ambulatory Visit: Payer: Self-pay | Admitting: Internal Medicine

## 2021-08-02 ENCOUNTER — Other Ambulatory Visit (HOSPITAL_COMMUNITY): Payer: Self-pay

## 2021-08-02 MED ORDER — AMLODIPINE BESYLATE 10 MG PO TABS
10.0000 mg | ORAL_TABLET | Freq: Every day | ORAL | 0 refills | Status: DC
  Filled 2021-08-02: qty 15, 15d supply, fill #0

## 2021-08-04 ENCOUNTER — Other Ambulatory Visit (HOSPITAL_COMMUNITY): Payer: Self-pay

## 2021-09-19 ENCOUNTER — Encounter: Payer: Self-pay | Admitting: Internal Medicine

## 2021-11-14 ENCOUNTER — Encounter: Payer: Self-pay | Admitting: Internal Medicine

## 2021-11-14 ENCOUNTER — Telehealth: Payer: Self-pay

## 2021-11-14 NOTE — Telephone Encounter (Signed)
Yes, last seen 2021

## 2021-11-14 NOTE — Telephone Encounter (Signed)
Multiple no shows/late cancellations.   09/08/2020 09/19/2021 11/14/2021  Would you like to begin dismissal process?

## 2021-11-14 NOTE — Telephone Encounter (Signed)
Can you begin dismissal process?

## 2021-11-15 ENCOUNTER — Encounter: Payer: Self-pay | Admitting: Internal Medicine

## 2021-11-15 NOTE — Telephone Encounter (Signed)
Dismissal letter mailed to Pt.

## 2022-03-22 ENCOUNTER — Emergency Department (HOSPITAL_COMMUNITY): Payer: Self-pay

## 2022-03-22 ENCOUNTER — Other Ambulatory Visit: Payer: Self-pay

## 2022-03-22 ENCOUNTER — Encounter (HOSPITAL_COMMUNITY): Payer: Self-pay | Admitting: Emergency Medicine

## 2022-03-22 ENCOUNTER — Ambulatory Visit
Admission: EM | Admit: 2022-03-22 | Discharge: 2022-03-22 | Disposition: A | Discharge status: Home / Self Care | Payer: Self-pay | Attending: Nurse Practitioner | Admitting: Nurse Practitioner

## 2022-03-22 ENCOUNTER — Observation Stay (HOSPITAL_COMMUNITY)
Admission: EM | Admit: 2022-03-22 | Discharge: 2022-03-23 | Disposition: A | Discharge status: Home / Self Care | Payer: Self-pay | Attending: Family Medicine | Admitting: Family Medicine

## 2022-03-22 DIAGNOSIS — R9431 Abnormal electrocardiogram [ECG] [EKG]: Secondary | ICD-10-CM | POA: Insufficient documentation

## 2022-03-22 DIAGNOSIS — Z79899 Other long term (current) drug therapy: Secondary | ICD-10-CM | POA: Insufficient documentation

## 2022-03-22 DIAGNOSIS — E785 Hyperlipidemia, unspecified: Secondary | ICD-10-CM | POA: Insufficient documentation

## 2022-03-22 DIAGNOSIS — R7303 Prediabetes: Secondary | ICD-10-CM | POA: Insufficient documentation

## 2022-03-22 DIAGNOSIS — E876 Hypokalemia: Secondary | ICD-10-CM | POA: Insufficient documentation

## 2022-03-22 DIAGNOSIS — Z7982 Long term (current) use of aspirin: Secondary | ICD-10-CM | POA: Insufficient documentation

## 2022-03-22 DIAGNOSIS — I10 Essential (primary) hypertension: Secondary | ICD-10-CM

## 2022-03-22 DIAGNOSIS — I160 Hypertensive urgency: Principal | ICD-10-CM | POA: Insufficient documentation

## 2022-03-22 DIAGNOSIS — R0789 Other chest pain: Secondary | ICD-10-CM | POA: Insufficient documentation

## 2022-03-22 LAB — HEPATIC FUNCTION PANEL
ALT: 17 U/L (ref 0–44)
AST: 19 U/L (ref 15–41)
Albumin: 4.2 g/dL (ref 3.5–5.0)
Alkaline Phosphatase: 80 U/L (ref 38–126)
Bilirubin, Direct: 0.2 mg/dL (ref 0.0–0.2)
Indirect Bilirubin: 0.7 mg/dL (ref 0.3–0.9)
Total Bilirubin: 0.9 mg/dL (ref 0.3–1.2)
Total Protein: 8.6 g/dL — ABNORMAL HIGH (ref 6.5–8.1)

## 2022-03-22 LAB — BASIC METABOLIC PANEL
Anion gap: 12 (ref 5–15)
BUN: 18 mg/dL (ref 6–20)
CO2: 26 mmol/L (ref 22–32)
Calcium: 9.4 mg/dL (ref 8.9–10.3)
Chloride: 98 mmol/L (ref 98–111)
Creatinine, Ser: 0.95 mg/dL (ref 0.44–1.00)
GFR, Estimated: >60 mL/min (ref >60)
Glucose, Bld: 93 mg/dL (ref 70–99)
Potassium: 2.9 mmol/L — ABNORMAL LOW (ref 3.5–5.1)
Sodium: 136 mmol/L (ref 135–145)

## 2022-03-22 LAB — CBC WITH DIFFERENTIAL/PLATELET
Abs Immature Granulocytes: 0.02 10*3/uL (ref 0.00–0.07)
Basophils Absolute: 0.1 10*3/uL (ref 0.0–0.1)
Basophils Relative: 1 %
Eosinophils Absolute: 0.3 10*3/uL (ref 0.0–0.5)
Eosinophils Relative: 4 %
HCT: 42 % (ref 36.0–46.0)
Hemoglobin: 14 g/dL (ref 12.0–15.0)
Immature Granulocytes: 0 %
Lymphocytes Relative: 43 %
Lymphs Abs: 3.1 10*3/uL (ref 0.7–4.0)
MCH: 29.7 pg (ref 26.0–34.0)
MCHC: 33.3 g/dL (ref 30.0–36.0)
MCV: 89 fL (ref 80.0–100.0)
Monocytes Absolute: 0.5 10*3/uL (ref 0.1–1.0)
Monocytes Relative: 7 %
Neutro Abs: 3.2 10*3/uL (ref 1.7–7.7)
Neutrophils Relative %: 45 %
Platelets: 414 10*3/uL — ABNORMAL HIGH (ref 150–400)
RBC: 4.72 MIL/uL (ref 3.87–5.11)
RDW: 12.8 % (ref 11.5–15.5)
WBC: 7.3 10*3/uL (ref 4.0–10.5)
nRBC: 0 % (ref 0.0–0.2)

## 2022-03-22 LAB — TROPONIN I (HIGH SENSITIVITY)
Troponin I (High Sensitivity): 17 ng/L (ref <18)
Troponin I (High Sensitivity): 18 ng/L — ABNORMAL HIGH (ref <18)

## 2022-03-22 LAB — LIPASE, BLOOD: Lipase: 31 U/L (ref 11–51)

## 2022-03-22 MED ORDER — AMLODIPINE BESYLATE 10 MG PO TABS
10.0000 mg | ORAL_TABLET | Freq: Every day | ORAL | Status: DC
  Administered 2022-03-23: 10 mg via ORAL
  Filled 2022-03-22: qty 1

## 2022-03-22 MED ORDER — ENOXAPARIN SODIUM 40 MG/0.4ML IJ SOSY
40.0000 mg | PREFILLED_SYRINGE | INTRAMUSCULAR | Status: DC
  Administered 2022-03-23: 40 mg via SUBCUTANEOUS
  Filled 2022-03-22: qty 0.4

## 2022-03-22 MED ORDER — LABETALOL HCL 5 MG/ML IV SOLN
40.0000 mg | Freq: Once | INTRAVENOUS | Status: AC
  Administered 2022-03-22: 40 mg via INTRAVENOUS
  Filled 2022-03-22: qty 8

## 2022-03-22 MED ORDER — ACETAMINOPHEN 325 MG PO TABS
650.0000 mg | ORAL_TABLET | Freq: Four times a day (QID) | ORAL | Status: DC | PRN
  Administered 2022-03-23: 650 mg via ORAL
  Filled 2022-03-22: qty 2

## 2022-03-22 MED ORDER — POTASSIUM CHLORIDE CRYS ER 20 MEQ PO TBCR
20.0000 meq | EXTENDED_RELEASE_TABLET | Freq: Once | ORAL | Status: AC
  Administered 2022-03-23: 20 meq via ORAL
  Filled 2022-03-22: qty 1

## 2022-03-22 MED ORDER — HYDRALAZINE HCL 20 MG/ML IJ SOLN: 10.0000 mg | INTRAMUSCULAR | Status: DC | PRN

## 2022-03-22 MED ORDER — AMLODIPINE BESYLATE 5 MG PO TABS
10.0000 mg | ORAL_TABLET | Freq: Once | ORAL | Status: AC
  Administered 2022-03-22: 10 mg via ORAL
  Filled 2022-03-22: qty 2

## 2022-03-22 MED ORDER — POTASSIUM CHLORIDE CRYS ER 20 MEQ PO TBCR
40.0000 meq | EXTENDED_RELEASE_TABLET | Freq: Once | ORAL | Status: AC
  Administered 2022-03-22: 40 meq via ORAL
  Filled 2022-03-22: qty 2

## 2022-03-22 MED ORDER — CARVEDILOL 25 MG PO TABS
25.0000 mg | ORAL_TABLET | Freq: Two times a day (BID) | ORAL | Status: DC
  Administered 2022-03-23 (×2): 25 mg via ORAL
  Filled 2022-03-22: qty 2
  Filled 2022-03-22: qty 1

## 2022-03-22 MED ORDER — ACETAMINOPHEN 650 MG RE SUPP: 650.0000 mg | Freq: Four times a day (QID) | RECTAL | Status: DC | PRN

## 2022-03-22 MED ORDER — FAMOTIDINE 20 MG PO TABS
10.0000 mg | ORAL_TABLET | Freq: Every day | ORAL | Status: DC | PRN
  Administered 2022-03-23: 10 mg via ORAL
  Filled 2022-03-22: qty 1

## 2022-03-22 MED ORDER — IRBESARTAN 300 MG PO TABS
300.0000 mg | ORAL_TABLET | Freq: Every day | ORAL | Status: DC
  Administered 2022-03-23 (×2): 300 mg via ORAL
  Filled 2022-03-22 (×3): qty 1

## 2022-03-22 NOTE — ED Provider Notes (Signed)
[place] EMERGENCY DEPARTMENT AT [facility] Provider Note   CSN: [id number] Arrival date & time: 03/22/22  1834     History  Chief Complaint  Patient presents with   Hypertension   Abnormal ECG    Claramae A Harbin is a 57 y.o. female.  Patient has a history of hypertension.  Patient has not been taking her blood pressure medicine for 2 years.  She has been having some chest discomfort off and on for the last 2 months  The history is provided by the patient and medical records. No language interpreter was used.  Hypertension This is a recurrent problem. The current episode started more than 1 week ago. The problem occurs constantly. The problem has not changed since onset.Associated symptoms include chest pain. Pertinent negatives include no abdominal pain and no headaches. Nothing aggravates the symptoms. Nothing relieves the symptoms. She has tried nothing for the symptoms.       Home Medications Prior to Admission medications   Medication Sig Start Date End Date Taking? Authorizing Provider  aspirin EC 81 MG tablet Take 81 mg by mouth daily.   Yes [provider]  amLODipine (NORVASC) 10 MG tablet Take 1 tablet (10 mg total) by mouth daily. Patient not taking: Reported on 03/22/2022 08/02/21   Colon Branch, MD  Blood Pressure Monitoring (BLOOD PRESSURE CUFF) MISC 1 Product by Does not apply route daily. MONITOR YOUR BLOOD PRESSURE 1-2 TIMES A DAY. 11/20/18   Lorretta Harp, MD  carvedilol (COREG) 25 MG tablet TAKE 1 TABLET BY MOUTH 2 TIMES DAILY WITH A MEAL. 02/17/20 02/16/21  Colon Branch, MD  chlorthalidone (HYGROTON) 25 MG tablet Take 1.5 tablets (37.5 mg total) by mouth daily. Patient not taking: Reported on 03/22/2022 05/14/19   Colon Branch, MD  COVID-19 mRNA Vac-TriS, Pfizer, SUSP injection Inject into the muscle. 10/19/20   Carlyle Basques, MD  potassium chloride (KLOR-CON) 10 MEQ tablet Take 2 tablets (20 mEq total) by mouth daily. Patient not taking:  Reported on 03/22/2022 05/14/19   Colon Branch, MD  valsartan (DIOVAN) 320 MG tablet Take 1 tablet (320 mg total) by mouth daily. Patient not taking: Reported on 03/22/2022 04/03/19   Lorretta Harp, MD  zolpidem (AMBIEN) 10 MG tablet Take 1 tablet (10 mg total) by mouth at bedtime as needed for sleep. 08/02/20 01/29/21  Colon Branch, MD  zolpidem (AMBIEN) 10 MG tablet Take 1 tablet by mouth at bedtime as needed for sleep Patient not taking: Reported on 03/22/2022 08/02/20   Colon Branch, MD      Allergies    Soy allergy, Citrus, Codeine, and Pollen extract    Review of Systems   Review of Systems  Constitutional:  Negative for appetite change and fatigue.  HENT:  Negative for congestion, ear discharge and sinus pressure.   Eyes:  Negative for discharge.  Respiratory:  Negative for cough.   Cardiovascular:  Positive for chest pain.  Gastrointestinal:  Negative for abdominal pain and diarrhea.  Genitourinary:  Negative for frequency and hematuria.  Musculoskeletal:  Negative for back pain.  Skin:  Negative for rash.  Neurological:  Negative for seizures and headaches.  Psychiatric/Behavioral:  Negative for hallucinations.     Physical Exam Updated Vital Signs BP (!) 155/91   Pulse 72   Temp 98.5 F (36.9 C) (Oral)   Resp 19   SpO2 96%  Physical Exam Vitals and nursing note reviewed.  Constitutional:  Appearance: She is well-developed.  HENT:     Head: Normocephalic.     Nose: Nose normal.  Eyes:     General: No scleral icterus.    Conjunctiva/sclera: Conjunctivae normal.  Neck:     Thyroid: No thyromegaly.  Cardiovascular:     Rate and Rhythm: Normal rate and regular rhythm.     Heart sounds: No murmur heard.    No friction rub. No gallop.  Pulmonary:     Breath sounds: No stridor. No wheezing or rales.  Chest:     Chest wall: No tenderness.  Abdominal:     General: There is no distension.     Tenderness: There is no abdominal tenderness. There is no rebound.   Musculoskeletal:        General: Normal range of motion.     Cervical back: Neck supple.  Lymphadenopathy:     Cervical: No cervical adenopathy.  Skin:    Findings: No erythema or rash.  Neurological:     Mental Status: She is alert and oriented to person, place, and time.     Motor: No abnormal muscle tone.     Coordination: Coordination normal.  Psychiatric:        Behavior: Behavior normal.     ED Results / Procedures / Treatments   Labs (all labs ordered are listed, but only abnormal results are displayed) Labs Reviewed  CBC WITH DIFFERENTIAL/PLATELET - Abnormal; Notable for the following components:      Result Value   Platelets 414 (*)    All other components within normal limits  BASIC METABOLIC PANEL - Abnormal; Notable for the following components:   Potassium 2.9 (*)    All other components within normal limits  HEPATIC FUNCTION PANEL - Abnormal; Notable for the following components:   Total Protein 8.6 (*)    All other components within normal limits  TROPONIN I (HIGH SENSITIVITY) - Abnormal; Notable for the following components:   Troponin I (High Sensitivity) 18 (*)    All other components within normal limits  LIPASE, BLOOD  TROPONIN I (HIGH SENSITIVITY)    EKG EKG Interpretation  Date/Time:  Wednesday March 22 2022 18:44:24 EST Ventricular Rate:  98 PR Interval:  196 QRS Duration: 78 QT Interval:  385 QTC Calculation: 492 R Axis:   41 Text Interpretation: Sinus rhythm Right atrial enlargement Left ventricular hypertrophy Borderline T abnormalities, lateral leads Borderline prolonged QT interval Confirmed by Davonna Belling [phone]) on 03/22/2022 6:54:38 PM  Radiology DG Chest 1 View  Result Date: 03/22/2022 CLINICAL DATA:  Chest pain and hypertension. EXAM: CHEST  1 VIEW COMPARISON:  10/24/2018 FINDINGS: Normal heart size and mediastinal contours. Aortic atherosclerotic calcifications. No pleural fluid or airspace disease. Visualized osseous  structures appear intact. IMPRESSION: No active disease. Electronically Signed   By: Kerby Moors M.D.   On: 03/22/2022 19:09    Procedures Procedures    Medications Ordered in ED Medications  labetalol (NORMODYNE) injection 40 mg (has no administration in time range)  potassium chloride SA (KLOR-CON M) CR tablet 40 mEq (has no administration in time range)  amLODipine (NORVASC) tablet 10 mg (10 mg Oral Given 03/22/22 2038)  labetalol (NORMODYNE) injection 40 mg (40 mg Intravenous Given 03/22/22 2038)    ED Course/ Medical Decision Making/ A&P   { CRITICAL CARE Performed by: Milton Ferguson Total critical care time: 45 minutes Critical care time was exclusive of separately billable procedures and treating other patients. Critical care was necessary to treat or prevent  imminent or life-threatening deterioration. Critical care was time spent personally by me on the following activities: development of treatment plan with patient and/or surrogate as well as nursing, discussions with consultants, evaluation of patient's response to treatment, examination of patient, obtaining history from patient or surrogate, ordering and performing treatments and interventions, ordering and review of laboratory studies, ordering and review of radiographic studies, pulse oximetry and re-evaluation of patient's condition.                            Medical Decision Making Amount and/or Complexity of Data Reviewed Labs: ordered.  Risk Prescription drug management. Decision regarding hospitalization.  This patient presents to the ED for concern of chest pain and hypertension, this involves an extensive number of treatment options, and is a complaint that carries with it a high risk of complications and morbidity.  The differential diagnosis includes coronary artery disease and hypertension   Co morbidities that complicate the patient evaluation  Obesity and hypertension   Additional history  obtained:  Additional history obtained from patient External records from outside source obtained and reviewed including hospital records   Lab Tests:  I Ordered, and personally interpreted labs.  The pertinent results include: White count 7.3, hemoglobin 14, potassium 2.9   Imaging Studies ordered:  I ordered imaging studies including chest x-ray I independently visualized and interpreted imaging which showed negative I agree with the radiologist interpretation   Cardiac Monitoring: / EKG:  The patient was maintained on a cardiac monitor.  I personally viewed and interpreted the cardiac monitored which showed an underlying rhythm of: Normal sinus rhythm   Consultations Obtained:  I requested consultation with the hospitalist,  and discussed lab and imaging findings as well as pertinent plan - they recommend: Admit for hypertensive urgency   Problem List / ED Course / Critical interventions / Medication management  Poorly controlled blood pressure and chest pain I ordered medication including labetalol IV for blood pressure Reevaluation of the patient after these medicines showed that the patient improved I have reviewed the patients home medicines and have made adjustments as needed   Social Determinants of Health:  None   Test / Admission - Considered:  None  Patient with hypertensive urgency that has improved with IV labetalol and p.o. Norvasc.  Her chest pain has also improved.  Patient will be admitted to medicine for control of her blood pressure and further evaluation of chest discomfort   Final Clinical Impression(s) / ED Diagnoses Final diagnoses:  Hypertensive urgency    Rx / DC Orders ED Discharge Orders     None         Milton Ferguson, MD 03/24/22 1831

## 2022-03-22 NOTE — ED Provider Notes (Signed)
[facility]    CSN: [id number] Arrival date & time: 03/22/22  1233      History   Chief Complaint Chief Complaint  Patient presents with   Hypertension   Gastroesophageal Reflux    HPI Sara Norris is a 57 y.o. female presents for evaluation of high blood pressure.  Patient has a history of hypertension and has been off her medications for a year as she has no PCP and had insurance changes.  Patient reports today at work she had some acid reflux which she has a history of.  For unclear reason she decided to check her blood pressure and it was 263/158.  She rechecked it again and states it was 242/150.  This prompted her to come in for evaluation.  She does not check her blood pressure at home.  She has a history of GERD but otherwise denies diabetes, hyperlipidemia, or smoking history.  She currently denies headache, visual changes, chest pain, shortness of breath, dizziness.  She did take 81 of aspirin this morning which she takes daily.  No other concerns at this time.   Hypertension  Gastroesophageal Reflux    Past Medical History:  Diagnosis Date   Allergy    seasonal   Chronic knee pain    Colonic diverticular abscess    Contraception    menopausal   Diverticulosis    Hiatal hernia    per pt, not aware of this.   Hypertension    dx age 47   Menopause    LMP age 67   Umbilical hernia    per pt/ nopt aware of this.    Patient Active Problem List   Diagnosis Date Noted   OSA DX 02-2019 05/15/2019   Headache 09/03/2017   Acute diverticulitis, h/o 03/08/2015   PCP NOTES >>>>> 01/16/2015   Insomnia 06/17/2013   General medical examination 05/24/2010   Essential hypertension 04/09/2009    Past Surgical History:  Procedure Laterality Date   CHOLECYSTECTOMY     MYOMECTOMY     removed fibroids/ when she was in her 20"s   TONSILLECTOMY      OB History     Gravida  3   Para  1   Term      Preterm      AB  2   Living  1      SAB       IAB      Ectopic      Multiple      Live Births               Home Medications    Prior to Admission medications   Medication Sig Start Date End Date Taking? Authorizing Provider  amLODipine (NORVASC) 10 MG tablet Take 1 tablet (10 mg total) by mouth daily. 08/02/21   Colon Branch, MD  aspirin EC 81 MG tablet Take 81 mg by mouth daily.    [provider]  Blood Pressure Monitoring (BLOOD PRESSURE CUFF) MISC 1 Product by Does not apply route daily. MONITOR YOUR BLOOD PRESSURE 1-2 TIMES A DAY. 11/20/18   Lorretta Harp, MD  carvedilol (COREG) 25 MG tablet TAKE 1 TABLET BY MOUTH 2 TIMES DAILY WITH A MEAL. 02/17/20 02/16/21  Colon Branch, MD  chlorthalidone (HYGROTON) 25 MG tablet Take 1.5 tablets (37.5 mg total) by mouth daily. 05/14/19   Colon Branch, MD  Cholecalciferol (VITAMIN D3) 400 units tablet Take 400 Units by mouth daily.  [provider]  COVID-19 mRNA Vac-TriS, Pfizer, SUSP injection Inject into the muscle. 10/19/20   Carlyle Basques, MD  EPINEPHrine 0.3 mg/0.3 mL IJ SOAJ injection Inject 0.3 mg into the muscle once. Patient not taking: Reported on 10/13/2019    [provider]  potassium chloride (KLOR-CON) 10 MEQ tablet Take 2 tablets (20 mEq total) by mouth daily. 05/14/19   Colon Branch, MD  valsartan (DIOVAN) 320 MG tablet Take 1 tablet (320 mg total) by mouth daily. 04/03/19   Lorretta Harp, MD  zolpidem (AMBIEN) 10 MG tablet Take 1 tablet (10 mg total) by mouth at bedtime as needed for sleep. 08/02/20 01/29/21  Colon Branch, MD  zolpidem (AMBIEN) 10 MG tablet Take 1 tablet by mouth at bedtime as needed for sleep 08/02/20   Colon Branch, MD    Family History Family History  Problem Relation Age of Onset   Hypertension Mother    Diabetes Mother    Arthritis Mother    Gout Mother    Hypertension Father    Diabetes Father        GP   Hypertension Other        GP   CAD Maternal Grandmother    Stroke Neg Hx    Colon cancer Neg Hx     Breast cancer Neg Hx     Social History Social History   Tobacco Use   Smoking status: Never   Smokeless tobacco: Never  Vaping Use   Vaping Use: Never used  Substance Use Topics   Alcohol use: Yes    Comment: SOCIAL    Drug use: No    Comment: no marijuana ;lately      Allergies   Soy allergy, Citrus, Codeine, and Pollen extract   Review of Systems Review of Systems  Cardiovascular:        High blood pressure     Physical Exam Triage Vital Signs ED Triage Vitals  Enc Vitals Group     BP 03/22/22 1246 (!) 246/135     Pulse Rate 03/22/22 1246 99     Resp 03/22/22 1246 18     Temp --      Temp Source 03/22/22 1246 Oral     SpO2 03/22/22 1246 99 %     Weight --      Height --      Head Circumference --      Peak Flow --      Pain Score 03/22/22 1244 0     Pain Loc --      Pain Edu? --      Excl. in [place]? --    No data found.  Updated Vital Signs BP (!) 260/140 (BP Location: Left Arm) Comment: provider made aware, EKG ordered  Pulse 99   Resp 18   SpO2 99%   Visual Acuity Right Eye Distance:   Left Eye Distance:   Bilateral Distance:    Right Eye Near:   Left Eye Near:    Bilateral Near:     Physical Exam Vitals and nursing note reviewed.  Constitutional:      General: She is not in acute distress.    Appearance: Normal appearance. She is obese. She is not ill-appearing, toxic-appearing or diaphoretic.  HENT:     Head: Normocephalic and atraumatic.  Eyes:     Pupils: Pupils are equal, round, and reactive to light.  Cardiovascular:     Rate and Rhythm: Normal rate and regular rhythm.  Heart sounds: Normal heart sounds.  Pulmonary:     Effort: Pulmonary effort is normal.     Breath sounds: Normal breath sounds.  Skin:    General: Skin is warm and dry.  Neurological:     General: No focal deficit present.     Mental Status: She is alert and oriented to person, place, and time.     GCS: GCS eye subscore is 4. GCS verbal subscore is 5. GCS  motor subscore is 6.     Cranial Nerves: No facial asymmetry.     Motor: No weakness.  Psychiatric:        Mood and Affect: Mood normal.        Behavior: Behavior normal.      UC Treatments / Results  Labs (all labs ordered are listed, but only abnormal results are displayed) Labs Reviewed - No data to display  EKG Sinus rhythm, heart rate 83, with T wave abnormalities and prolonged QTc.  Previous EKG from 2020 reviewed that did show T wave abnormalities as well.  Radiology No results found.  Procedures Procedures (including critical care time)  Medications Ordered in UC Medications - No data to display  Initial Impression / Assessment and Plan / UC Course  I have reviewed the triage vital signs and the nursing notes.  Pertinent labs & imaging results that were available during my care of the patient were reviewed by me and considered in my medical decision making (see chart for details).     I discussed patient blood pressure and EKG in length.  I discussed limitations and abilities of urgent care. I advise she go to the emergency room ASAP for further evaluation given her abnormal EKG with blood pressure readings in clinic.  Patient is currently asymptomatic with no headache, chest pain, dizziness, visual changes, shortness of breath.  She is in agreement to go to the emergency room and will go to [facility], ER.  She was instructed to pull over and call 911 for any worsening symptoms that occur in transit and she verbalized understanding. Final Clinical Impressions(s) / UC Diagnoses   Final diagnoses:  Accelerated hypertension  Nonspecific abnormal electrocardiogram (ECG) (EKG)     Discharge Instructions      Please go to the emergency room ASAP for further evaluation and treatment of your high blood pressure and abnormal EKG. pull over and call 911 for any worsening symptoms that occur in transit.  Includes but is not limited to chest pain, shortness of breath,  dizziness, visual changes, or any new concerns that arise     ED Prescriptions   None    PDMP not reviewed this encounter.   Melynda Ripple, NP 03/22/22 1321

## 2022-03-22 NOTE — ED Triage Notes (Signed)
Pt referred to ED from UC for abnormal EKG and hypertension. States she was at work and had chest discomfort and burning and had her BP taken and noted to be extremely hypertensive and coworkers urged her to seek tx at [facility]. While there pt was noted to be hypertensive in the 230's/150's and have abnormal EKG. Pt denies headache, vision changes, weakness. Pt not on blood pressure medication for  the last year.

## 2022-03-22 NOTE — Discharge Instructions (Addendum)
Please go to the emergency room ASAP for further evaluation and treatment of your high blood pressure and abnormal EKG. pull over and call 911 for any worsening symptoms that occur in transit.  Includes but is not limited to chest pain, shortness of breath, dizziness, visual changes, or any new concerns that arise

## 2022-03-22 NOTE — H&P (Signed)
History and Physical    TOCARA MENNEN ZSW:[id number] DOB: [birth year] DOA: 03/22/2022  PCP: Pcp, No  Patient coming from: Home  Chief Complaint: High blood pressure  HPI: Sara Norris is a 57 y.o. female with medical history significant of hypertension, hyperlipidemia, prediabetes, and other medical comorbidities.  History of medical noncompliance. Last PCP visit was in November 2021 and since then multiple missed appointments/cancellations.  PCPs office had sent her a dismissal letter in September 2023.  She presents to the ED with high blood pressure and chest discomfort.  Seen at urgent care earlier today and blood pressure noted to be 260/140 and had an abnormal EKG, sent to the ED for further evaluation.  On arrival to the ED, blood pressure 239/154.  Labs significant for potassium 2.9, troponin 17> 18, lipase and LFTs normal.  Chest x-ray showing no active disease.  EKG showing sinus rhythm, LVH, QTc 492, and no acute ischemic changes.  T wave inversions inferolaterally seen on prior EKG from September 2020 as well. Patient was given oral potassium 40 mEq.  Blood pressure improved after amlodipine 10 mg and IV labetalol 40 mg x 2.  TRH called to admit for hypertensive urgency.  Patient states she ran out of her home blood pressure medications a year ago.  Today at work she had lemonade and then experienced some burning substernal chest discomfort/epigastric discomfort which she thought was due to heartburn.  She checked her blood pressure at work and it was 238/158 which prompted her to seek medical attention.  Patient states she is currently in the process of establishing care with a new PCP.  She has no other complaints.  Denies any chest pain/discomfort or heartburn at present.  Denies fevers, cough, shortness of breath, nausea, vomiting, abdominal pain, or diarrhea.  Review of Systems:  Review of Systems  All other systems reviewed and are negative.   Past Medical History:  Diagnosis  Date   Allergy    seasonal   Chronic knee pain    Colonic diverticular abscess    Contraception    menopausal   Diverticulosis    Hiatal hernia    per pt, not aware of this.   Hypertension    dx age 41   Menopause    LMP age 54   Umbilical hernia    per pt/ nopt aware of this.    Past Surgical History:  Procedure Laterality Date   CHOLECYSTECTOMY     MYOMECTOMY     removed fibroids/ when she was in her 60"s   TONSILLECTOMY       reports that she has never smoked. She has never used smokeless tobacco. She reports current alcohol use. She reports that she does not use drugs.  Allergies  Allergen Reactions   Soy Allergy     Itching of throat.   Citrus Rash   Codeine Rash   Pollen Extract     sneezing    Family History  Problem Relation Age of Onset   Hypertension Mother    Diabetes Mother    Arthritis Mother    Gout Mother    Hypertension Father    Diabetes Father        GP   Hypertension Other        GP   CAD Maternal Grandmother    Stroke Neg Hx    Colon cancer Neg Hx    Breast cancer Neg Hx     Prior to Admission medications   Medication Sig  Start Date End Date Taking? Authorizing Provider  aspirin EC 81 MG tablet Take 81 mg by mouth daily.   Yes [provider]  amLODipine (NORVASC) 10 MG tablet Take 1 tablet (10 mg total) by mouth daily. Patient not taking: Reported on 03/22/2022 08/02/21   Colon Branch, MD  Blood Pressure Monitoring (BLOOD PRESSURE CUFF) MISC 1 Product by Does not apply route daily. MONITOR YOUR BLOOD PRESSURE 1-2 TIMES A DAY. 11/20/18   Lorretta Harp, MD  carvedilol (COREG) 25 MG tablet TAKE 1 TABLET BY MOUTH 2 TIMES DAILY WITH A MEAL. 02/17/20 02/16/21  Colon Branch, MD  chlorthalidone (HYGROTON) 25 MG tablet Take 1.5 tablets (37.5 mg total) by mouth daily. Patient not taking: Reported on 03/22/2022 05/14/19   Colon Branch, MD  COVID-19 mRNA Vac-TriS, Pfizer, SUSP injection Inject into the muscle. 10/19/20   Carlyle Basques, MD   potassium chloride (KLOR-CON) 10 MEQ tablet Take 2 tablets (20 mEq total) by mouth daily. Patient not taking: Reported on 03/22/2022 05/14/19   Colon Branch, MD  valsartan (DIOVAN) 320 MG tablet Take 1 tablet (320 mg total) by mouth daily. Patient not taking: Reported on 03/22/2022 04/03/19   Lorretta Harp, MD  zolpidem (AMBIEN) 10 MG tablet Take 1 tablet (10 mg total) by mouth at bedtime as needed for sleep. 08/02/20 01/29/21  Colon Branch, MD  zolpidem Lorrin Mais) 10 MG tablet Take 1 tablet by mouth at bedtime as needed for sleep Patient not taking: Reported on 03/22/2022 08/02/20   Colon Branch, MD    Physical Exam: Vitals:   03/22/22 2130 03/22/22 2200 03/22/22 2215 03/22/22 2225  BP: (!) 171/96 (!) 161/99 (!) 155/91   Pulse: 71 78 67 72  Resp: 17 15 18 19   Temp:      TempSrc:      SpO2: 98% 96% 97% 96%    Physical Exam Vitals reviewed.  Constitutional:      General: She is not in acute distress. HENT:     Head: Normocephalic and atraumatic.  Eyes:     Extraocular Movements: Extraocular movements intact.  Cardiovascular:     Rate and Rhythm: Normal rate and regular rhythm.     Pulses: Normal pulses.  Pulmonary:     Effort: Pulmonary effort is normal. No respiratory distress.     Breath sounds: Normal breath sounds. No wheezing or rales.  Abdominal:     General: Bowel sounds are normal. There is no distension.     Palpations: Abdomen is soft.     Tenderness: There is no abdominal tenderness.  Musculoskeletal:     Cervical back: Normal range of motion.     Right lower leg: No edema.     Left lower leg: No edema.  Skin:    General: Skin is warm and dry.  Neurological:     General: No focal deficit present.     Mental Status: She is alert and oriented to person, place, and time.     Labs on Admission: I have personally reviewed following labs and imaging studies  CBC: Recent Labs  Lab 03/22/22 1854  WBC 7.3  NEUTROABS 3.2  HGB 14.0  HCT 42.0  MCV 89.0  PLT 414*    Basic Metabolic Panel: Recent Labs  Lab 03/22/22 1854  NA 136  K 2.9*  CL 98  CO2 26  GLUCOSE 93  BUN 18  CREATININE 0.95  CALCIUM 9.4   GFR: CrCl cannot be calculated (Unknown ideal  weight.). Liver Function Tests: Recent Labs  Lab 03/22/22 1937  AST 19  ALT 17  ALKPHOS 80  BILITOT 0.9  PROT 8.6*  ALBUMIN 4.2   Recent Labs  Lab 03/22/22 1854  LIPASE 31   No results for input(s): "AMMONIA" in the last 168 hours. Coagulation Profile: No results for input(s): "INR", "PROTIME" in the last 168 hours. Cardiac Enzymes: No results for input(s): "CKTOTAL", "CKMB", "CKMBINDEX", "TROPONINI" in the last 168 hours. BNP (last 3 results) No results for input(s): "PROBNP" in the last 8760 hours. HbA1C: No results for input(s): "HGBA1C" in the last 72 hours. CBG: No results for input(s): "GLUCAP" in the last 168 hours. Lipid Profile: No results for input(s): "CHOL", "HDL", "LDLCALC", "TRIG", "CHOLHDL", "LDLDIRECT" in the last 72 hours. Thyroid Function Tests: No results for input(s): "TSH", "T4TOTAL", "FREET4", "T3FREE", "THYROIDAB" in the last 72 hours. Anemia Panel: No results for input(s): "VITAMINB12", "FOLATE", "FERRITIN", "TIBC", "IRON", "RETICCTPCT" in the last 72 hours. Urine analysis:    Component Value Date/Time   COLORURINE YELLOW 09/03/2018 0902   APPEARANCEUR CLEAR 09/03/2018 0902   LABSPEC 1.025 09/03/2018 0902   PHURINE 6.0 09/03/2018 0902   GLUCOSEU NEGATIVE 09/03/2018 0902   HGBUR SMALL (A) 09/03/2018 0902   BILIRUBINUR NEGATIVE 09/03/2018 0902   BILIRUBINUR Negative 09/03/2018 0824   KETONESUR NEGATIVE 09/03/2018 0902   PROTEINUR Positive (A) 09/03/2018 0824   PROTEINUR 100 (A) 09/03/2017 1236   UROBILINOGEN 0.2 09/03/2018 0902   UROBILINOGEN 0.2 09/03/2018 0824   NITRITE NEGATIVE 09/03/2018 0902   NITRITE Negative 09/03/2018 0824   LEUKOCYTESUR SMALL (A) 09/03/2018 0902   LEUKOCYTESUR Small (1+) (A) 09/03/2018 0824    Radiological Exams on  Admission: DG Chest 1 View  Result Date: 03/22/2022 CLINICAL DATA:  Chest pain and hypertension. EXAM: CHEST  1 VIEW COMPARISON:  10/24/2018 FINDINGS: Normal heart size and mediastinal contours. Aortic atherosclerotic calcifications. No pleural fluid or airspace disease. Visualized osseous structures appear intact. IMPRESSION: No active disease. Electronically Signed   By: Signa Kell M.D.   On: 03/22/2022 19:09    Assessment and Plan  Hypertensive urgency Due to noncompliance, ran out of her home medications a year ago.  Blood pressure significantly elevated with systolic 260 and diastolic up to 150s initially.  Patient received amlodipine 10 mg and IV labetalol 40 mg x 2 in the ED.  Blood pressure now improved with systolic currently in the 170s and diastolic in the low 100s. -Continue home medications including amlodipine, Coreg, and valsartan.  IV hydralazine PRN.  Holding chlorthalidone at this time given hypokalemia.  Chest discomfort Appears to be related to GERD.  Patient is currently asymptomatic.  ACS less likely as troponin mildly elevated but stable (17> 18) and EKG without acute ischemic changes.  T wave inversions inferolaterally seen on prior EKG from September 2020 as well. No tachycardia or hypoxia to suggest PE. -Cardiac monitoring -Echocardiogram -Pepcid as needed  Hypokalemia Borderline QT prolongation -Cardiac monitoring -Monitor potassium and magnesium levels, replace as needed. -Avoid QT prolonging drugs if possible  Hyperlipidemia Not on a statin. -Check lipid panel  Prediabetes A1c 6.1 on 10/13/2019. -Repeat A1c  DVT prophylaxis: Lovenox Code Status: Full Code (discussed with the patient) Level of care: Progressive Care Unit Admission status: It is my clinical opinion that referral for OBSERVATION is reasonable and necessary in this patient based on the above information provided. The aforementioned taken together are felt to place the patient at high risk  for further clinical deterioration. However, it is  anticipated that the patient may be medically stable for discharge from the hospital within 24 to 48 hours.   Shela Leff MD [facility]  If 7PM-7AM, please contact night-coverage [email or web address]  03/22/2022, 10:45 PM

## 2022-03-22 NOTE — ED Notes (Signed)
Patient is being discharged from the Urgent Care and sent to the Emergency Department via POV . Per Rosario Adie NP, patient is in need of higher level of care due to need for further evaluation & HTN. Patient is aware and verbalizes understanding of plan of care.  Vitals:   03/22/22 1246 03/22/22 1252  BP: (!) 246/135 (!) 260/140  Pulse: 99   Resp: 18   SpO2: 99%

## 2022-03-22 NOTE — ED Provider Triage Note (Signed)
Emergency Medicine Provider Triage Evaluation Note  Sara Norris , a 57 y.o. female  was evaluated in triage.  Pt complains of hypertension and chest discomfort.  She states she began having chest discomfort while at work and had chest pain and burning in her epigastric region that radiates into her esophagus.  Patient was evaluated at [name] and told to come to ED for further evaluation.  She denies headache, but reports episode of blurry vision earlier.  She has not been taking her prescribed medications for at least 1 year.   Review of Systems  Positive: As above Negative: As above  Physical Exam  BP (!) 239/154 (BP Location: Left Arm)   Pulse 94   Temp 98.5 F (36.9 C) (Oral)   Resp 18   SpO2 99%  Gen:   Awake, no distress   Resp:  Normal effort  MSK:   Moves extremities without difficulty  Other:    Medical Decision Making  Medically screening exam initiated at 6:50 PM.  Appropriate orders placed.  Erum A Maj was informed that the remainder of the evaluation will be completed by another provider, this initial triage assessment does not replace that evaluation, and the importance of remaining in the ED until their evaluation is complete.     Theressa Stamps R, [place] 03/22/22 [phone]

## 2022-03-22 NOTE — ED Triage Notes (Addendum)
Patient reports having severe acid reflux (ongoing), and high BP readings (around 142/150) today.   Pt denies chest pain (muscle), denies SOB, and denies numbness/tinging.   Asa (81mg ) taken about 1 hr ago   The patient has not been taking her BP medications due to insurance changes.

## 2022-03-22 NOTE — ED Notes (Signed)
EKG results given to provider.  

## 2022-03-23 ENCOUNTER — Observation Stay (HOSPITAL_BASED_OUTPATIENT_CLINIC_OR_DEPARTMENT_OTHER): Payer: Self-pay

## 2022-03-23 ENCOUNTER — Other Ambulatory Visit (HOSPITAL_COMMUNITY): Payer: Self-pay

## 2022-03-23 DIAGNOSIS — I160 Hypertensive urgency: Secondary | ICD-10-CM

## 2022-03-23 DIAGNOSIS — R9431 Abnormal electrocardiogram [ECG] [EKG]: Secondary | ICD-10-CM

## 2022-03-23 LAB — BASIC METABOLIC PANEL
Anion gap: 8 (ref 5–15)
BUN: 18 mg/dL (ref 6–20)
CO2: 24 mmol/L (ref 22–32)
Calcium: 9.1 mg/dL (ref 8.9–10.3)
Chloride: 104 mmol/L (ref 98–111)
Creatinine, Ser: 1.04 mg/dL — ABNORMAL HIGH (ref 0.44–1.00)
GFR, Estimated: >60 mL/min (ref >60)
Glucose, Bld: 124 mg/dL — ABNORMAL HIGH (ref 70–99)
Potassium: 3.7 mmol/L (ref 3.5–5.1)
Sodium: 136 mmol/L (ref 135–145)

## 2022-03-23 LAB — ECHOCARDIOGRAM COMPLETE
Area-P 1/2: 3.13 cm2
Calc EF: 64 %
Height: 66 in
S' Lateral: 2.1 cm
Single Plane A2C EF: 66.9 %
Single Plane A4C EF: 62.6 %
Weight: 3326.4 oz

## 2022-03-23 LAB — LIPID PANEL
Cholesterol: 225 mg/dL — ABNORMAL HIGH (ref 0–200)
HDL: 52 mg/dL (ref >40)
LDL Cholesterol: 153 mg/dL — ABNORMAL HIGH (ref 0–99)
Total CHOL/HDL Ratio: 4.3 RATIO
Triglycerides: 98 mg/dL (ref <150)
VLDL: 20 mg/dL (ref 0–40)

## 2022-03-23 LAB — HEMOGLOBIN A1C
Hgb A1c MFr Bld: 5.8 % — ABNORMAL HIGH (ref 4.8–5.6)
Mean Plasma Glucose: 119.76 mg/dL

## 2022-03-23 LAB — HIV ANTIBODY (ROUTINE TESTING W REFLEX): HIV Screen 4th Generation wRfx: NONREACTIVE

## 2022-03-23 LAB — MAGNESIUM: Magnesium: 2.1 mg/dL (ref 1.7–2.4)

## 2022-03-23 MED ORDER — CARVEDILOL 25 MG PO TABS
25.0000 mg | ORAL_TABLET | Freq: Two times a day (BID) | ORAL | 3 refills | Status: DC
  Filled 2022-03-23: qty 60, 30d supply, fill #0

## 2022-03-23 MED ORDER — AMLODIPINE BESYLATE 10 MG PO TABS
10.0000 mg | ORAL_TABLET | Freq: Every day | ORAL | 3 refills | Status: DC
  Filled 2022-03-23: qty 30, 30d supply, fill #0

## 2022-03-23 MED ORDER — VALSARTAN 320 MG PO TABS
320.0000 mg | ORAL_TABLET | Freq: Every day | ORAL | 3 refills | Status: DC
  Filled 2022-03-23: qty 90, 90d supply, fill #0

## 2022-03-23 NOTE — Progress Notes (Signed)
Echocardiogram 2D Echocardiogram has been performed.  Frances Furbish 03/23/2022, 10:12 AM

## 2022-03-23 NOTE — Discharge Instructions (Signed)
Advised to take amlodipine, Coreg and valsartan daily.

## 2022-03-23 NOTE — Discharge Summary (Addendum)
Physician Discharge Summary  Sara Norris WER:[id number] DOB: Jul 13, 1965 DOA: 03/22/2022  PCP: Sara Norris, No  Admit date: 03/22/2022  Discharge date: 03/23/2022  Admitted From: Home  Disposition:  Home.  Recommendations for Outpatient Follow-up:  Follow up with PCP in 1-2 weeks Please obtain BMP/CBC in one week Advised to take amlodipine, Coreg and valsartan daily.  Home Health: None  Equipment/Devices:None  Discharge Condition: Stable CODE STATUS:Full code Diet recommendation: Heart Healthy   Brief Summary/ Hospital Course: Sara Norris is a 57 y.o. female with medical history significant of hypertension, hyperlipidemia, prediabetes, and other medical comorbidities.  History of medical noncompliance. Last PCP visit was in November 2021 and since then multiple missed appointments/cancellations.  PCPs office had sent her a dismissal letter in September 2023.  She presents to the ED with high blood pressure and chest discomfort.  Seen at urgent care earlier today and blood pressure noted to be 260/140 and had an abnormal EKG, sent to the ED for further evaluation.  On arrival to the ED, blood pressure 239/154.  Labs significant for potassium 2.9, troponin 17> 18, lipase and LFTs normal. Chest x-ray showing no active disease.  EKG showing sinus rhythm, LVH, QTc 492, and no acute ischemic changes.  T wave inversions inferolaterally seen on prior EKG from September 2020 as well. Patient was given oral potassium 40 mEq.  Blood pressure improved after amlodipine 10 mg and IV labetalol 40 mg x 2.  TR[facility] called to admit for hypertensive urgency. Patient states she ran out of her home blood pressure medications a year ago.  Today at work she had lemonade and then experienced some burning substernal chest discomfort/epigastric discomfort which she thought was due to heartburn.  She checked her blood pressure at work and it was 238/158 which prompted her to seek medical attention.  Patient states she is  currently in the process of establishing care with a new PCP.  She has no other complaints.   Patient was continued on blood pressure medication which has significantly improved her BP after starting blood pressure medications.  Echocardiogram completed which was completely normal with LVEF 60 to 65%.  Patient denies any symptoms.  Feels better and wants to be discharged.  Patient has been given a prescription for amlodipine,  Coreg and valsartan for 3 months.  Patient has appointment with the PCP next week.  Patient is being discharged home.  Discharge Diagnoses:  Principal Problem:   Hypertensive urgency Active Problems:   Chest discomfort   Hypokalemia   QT prolongation   Hyperlipidemia   Prediabetes  Discharge Instructions  Discharge Instructions     Call MD for:  difficulty breathing, headache or visual disturbances   Complete by: As directed    Call MD for:  persistant dizziness or light-headedness   Complete by: As directed    Call MD for:  persistant nausea and vomiting   Complete by: As directed    Diet - low sodium heart healthy   Complete by: As directed    Diet Carb Modified   Complete by: As directed    Discharge instructions   Complete by: As directed    Advised to follow up PCP in one week. Advised to take amlodipine, Coreg and valsartan daily.   Increase activity slowly   Complete by: As directed       Allergies as of 03/23/2022       Reactions   Soy Allergy    Itching of throat.   Citrus Rash  Codeine Rash   Pollen Extract    sneezing        Medication List     STOP taking these medications    chlorthalidone 25 MG tablet Commonly known as: HYGROTON   potassium chloride 10 MEQ tablet Commonly known as: KLOR-CON       TAKE these medications    amLODipine 10 MG tablet Commonly known as: NORVASC Take 1 tablet (10 mg total) by mouth daily.   aspirin EC 81 MG tablet Take 81 mg by mouth daily.   Blood Pressure Cuff Misc 1 Product by  Does not apply route daily. MONITOR YOUR BLOOD PRESSURE 1-2 TIMES A DAY.   carvedilol 25 MG tablet Commonly known as: COREG Take 1 tablet (25 mg total) by mouth 2 (two) times daily with a meal. What changed: how much to take   Pfizer-BioNT COVID-19 Vac-TriS Susp injection Generic drug: COVID-19 mRNA Vac-TriS (Pfizer) Inject into the muscle.   valsartan 320 MG tablet Commonly known as: DIOVAN Take 1 tablet (320 mg total) by mouth daily.   zolpidem 10 MG tablet Commonly known as: AMBIEN Take 1 tablet (10 mg total) by mouth at bedtime as needed for sleep. What changed: Another medication with the same name was removed. Continue taking this medication, and follow the directions you see here.        Follow-up Information     Koirala, Dibas, MD Follow up in 1 week(s).   Specialty: Family Medicine Contact information: [address] [place] [address] [phone]                Allergies  Allergen Reactions   Soy Allergy     Itching of throat.   Citrus Rash   Codeine Rash   Pollen Extract     sneezing    Consultations: None   Procedures/Studies: ECHOCARDIOGRAM COMPLETE  Result Date: 03/23/2022    ECHOCARDIOGRAM REPORT   Patient Name:   Sara Norris Date of Exam: 03/23/2022 Medical Rec #:  [id number]      Height:       66.0 in Accession #:    [id number]     Weight:       207.9 lb Date of Birth:  Nov 15, 1965      BSA:          2.033 m Patient Age:    57 years       BP:           152/90 mmHg Patient Gender: F              HR:           65 bpm. Exam Location:  Inpatient Procedure: 2D Echo, Cardiac Doppler and Color Doppler Indications:    R94.31 Abnormal EKG  History:        Patient has no prior history of Echocardiogram examinations.                 Risk Factors:Hypertension.  Sonographer:    Phineas Douglas Referring Phys: [id number] Sara Norris IMPRESSIONS  1. Left ventricular ejection fraction, by estimation, is 65 to 70%. The left ventricle has  normal function. The left ventricle has no regional wall motion abnormalities. There is moderate concentric left ventricular hypertrophy. Left ventricular diastolic parameters are consistent with Grade I diastolic dysfunction (impaired relaxation).  2. Right ventricular systolic function is normal. The right ventricular size is normal.  3. The mitral valve is normal in structure. Trivial mitral valve regurgitation.  No evidence of mitral stenosis.  4. The aortic valve is tricuspid. There is mild calcification of the aortic valve. Aortic valve regurgitation is not visualized. Aortic valve sclerosis/calcification is present, without any evidence of aortic stenosis.  5. The inferior vena cava is normal in size with greater than 50% respiratory variability, suggesting right atrial pressure of 3 mmHg. FINDINGS  Left Ventricle: Left ventricular ejection fraction, by estimation, is 65 to 70%. The left ventricle has normal function. The left ventricle has no regional wall motion abnormalities. The left ventricular internal cavity size was normal in size. There is  moderate concentric left ventricular hypertrophy. Left ventricular diastolic parameters are consistent with Grade I diastolic dysfunction (impaired relaxation). Right Ventricle: The right ventricular size is normal. No increase in right ventricular wall thickness. Right ventricular systolic function is normal. Left Atrium: Left atrial size was normal in size. Right Atrium: Right atrial size was normal in size. Pericardium: There is no evidence of pericardial effusion. Mitral Valve: The mitral valve is normal in structure. Trivial mitral valve regurgitation. No evidence of mitral valve stenosis. Tricuspid Valve: The tricuspid valve is normal in structure. Tricuspid valve regurgitation is trivial. No evidence of tricuspid stenosis. Aortic Valve: The aortic valve is tricuspid. There is mild calcification of the aortic valve. Aortic valve regurgitation is not  visualized. Aortic valve sclerosis/calcification is present, without any evidence of aortic stenosis. Pulmonic Valve: The pulmonic valve was normal in structure. Pulmonic valve regurgitation is not visualized. No evidence of pulmonic stenosis. Aorta: The aortic root is normal in size and structure. Venous: The inferior vena cava is normal in size with greater than 50% respiratory variability, suggesting right atrial pressure of 3 mmHg. IAS/Shunts: No atrial level shunt detected by color flow Doppler.  LEFT VENTRICLE PLAX 2D LVIDd:         4.00 cm      Diastology LVIDs:         2.10 cm      LV e' medial:    5.43 cm/s LV PW:         1.50 cm      LV E/e' medial:  13.3 LV IVS:        1.40 cm      LV e' lateral:   4.95 cm/s LVOT diam:     2.10 cm      LV E/e' lateral: 14.6 LV SV:         70 LV SV Index:   34 LVOT Area:     3.46 cm  LV Volumes (MOD) LV vol d, MOD A2C: 117.0 ml LV vol d, MOD A4C: 104.0 ml LV vol s, MOD A2C: 38.7 ml LV vol s, MOD A4C: 38.9 ml LV SV MOD A2C:     78.3 ml LV SV MOD A4C:     104.0 ml LV SV MOD BP:      72.3 ml RIGHT VENTRICLE             IVC RV Basal diam:  3.00 cm     IVC diam: 1.20 cm RV S prime:     12.20 cm/s TAPSE (M-mode): 1.7 cm LEFT ATRIUM             Index        RIGHT ATRIUM           Index LA diam:        3.70 cm 1.82 cm/m   RA Area:     11.80 cm LA Vol (A2C):  46.8 ml 23.02 ml/m  RA Volume:   21.80 ml  10.72 ml/m LA Vol (A4C):   50.5 ml 24.84 ml/m LA Biplane Vol: 50.2 ml 24.69 ml/m  AORTIC VALVE             PULMONIC VALVE LVOT Vmax:   99.40 cm/s  PR End Diast Vel: 2.33 msec LVOT Vmean:  66.500 cm/s LVOT VTI:    0.201 m  AORTA Ao Root diam: 2.90 cm Ao Asc diam:  3.40 cm MITRAL VALVE MV Area (PHT): 3.13 cm    SHUNTS MV Decel Time: 242 msec    Systemic VTI:  0.20 m MV E velocity: 72.40 cm/s  Systemic Diam: 2.10 cm MV A velocity: 79.70 cm/s MV E/A ratio:  0.91 Arvilla Meres MD Electronically signed by Arvilla Meres MD Signature Date/Time: 03/23/2022/11:38:52 AM    Final     DG Chest 1 View  Result Date: 03/22/2022 CLINICAL DATA:  Chest pain and hypertension. EXAM: CHEST  1 VIEW COMPARISON:  10/24/2018 FINDINGS: Normal heart size and mediastinal contours. Aortic atherosclerotic calcifications. No pleural fluid or airspace disease. Visualized osseous structures appear intact. IMPRESSION: No active disease. Electronically Signed   By: Signa Kell M.D.   On: 03/22/2022 19:09     Subjective: Patient was seen and examined at bedside.  Overnight events noted.   Patient report doing better and wants to be discharged.  Patient is being discharged home.  Discharge Exam: Vitals:   03/23/22 0919 03/23/22 1342  BP: (!) 152/90 (!) 149/93  Pulse: 79 71  Resp:  18  Temp:  97.9 F (36.6 C)  SpO2:  97%   Vitals:   03/23/22 0536 03/23/22 0540 03/23/22 0919 03/23/22 1342  BP: (!) 138/91  (!) 152/90 (!) 149/93  Pulse: 78  79 71  Resp: 16   18  Temp: 97.7 F (36.5 C)   97.9 F (36.6 C)  TempSrc: Oral   Oral  SpO2: 100%   97%  Weight:  94.3 kg    Height:  5\' 6"  (1.676 m)      General: Pt is alert, awake, not in acute distress Cardiovascular: RRR, S1/S2 +, no rubs, no gallops Respiratory: CTA bilaterally, no wheezing, no rhonchi Abdominal: Soft, NT, ND, bowel sounds + Extremities: no edema, no cyanosis    The results of significant diagnostics from this hospitalization (including imaging, microbiology, ancillary and laboratory) are listed below for reference.     Microbiology: No results found for this or any previous visit (from the past 240 hour(s)).   Labs: BNP (last 3 results) No results for input(s): "BNP" in the last 8760 hours. Basic Metabolic Panel: Recent Labs  Lab 03/22/22 1854 03/23/22 0637  NA 136 136  K 2.9* 3.7  CL 98 104  CO2 26 24  GLUCOSE 93 124*  BUN 18 18  CREATININE 0.95 1.04*  CALCIUM 9.4 9.1  MG  --  2.1   Liver Function Tests: Recent Labs  Lab 03/22/22 1937  AST 19  ALT 17  ALKPHOS 80  BILITOT 0.9  PROT 8.6*   ALBUMIN 4.2   Recent Labs  Lab 03/22/22 1854  LIPASE 31   No results for input(s): "AMMONIA" in the last 168 hours. CBC: Recent Labs  Lab 03/22/22 1854  WBC 7.3  NEUTROABS 3.2  HGB 14.0  HCT 42.0  MCV 89.0  PLT 414*   Cardiac Enzymes: No results for input(s): "CKTOTAL", "CKMB", "CKMBINDEX", "TROPONINI" in the last 168 hours. BNP: Invalid input(s): "POCBNP" CBG:  No results for input(s): "GLUCAP" in the last 168 hours. D-Dimer No results for input(s): "DDIMER" in the last 72 hours. Hgb A1c Recent Labs    03/23/22 0637  HGBA1C 5.8*   Lipid Profile Recent Labs    03/23/22 0637  CHOL 225*  HDL 52  LDLCALC 153*  TRIG 98  CHOLHDL 4.3   Thyroid function studies No results for input(s): "TSH", "T4TOTAL", "T3FREE", "THYROIDAB" in the last 72 hours.  Invalid input(s): "FREET3" Anemia work up No results for input(s): "VITAMINB12", "FOLATE", "FERRITIN", "TIBC", "IRON", "RETICCTPCT" in the last 72 hours. Urinalysis    Component Value Date/Time   COLORURINE YELLOW 09/03/2018 0902   APPEARANCEUR CLEAR 09/03/2018 0902   LABSPEC 1.025 09/03/2018 0902   PHURINE 6.0 09/03/2018 0902   GLUCOSEU NEGATIVE 09/03/2018 0902   HGBUR SMALL (A) 09/03/2018 0902   BILIRUBINUR NEGATIVE 09/03/2018 0902   BILIRUBINUR Negative 09/03/2018 0824   KETONESUR NEGATIVE 09/03/2018 0902   PROTEINUR Positive (A) 09/03/2018 0824   PROTEINUR 100 (A) 09/03/2017 1236   UROBILINOGEN 0.2 09/03/2018 0902   UROBILINOGEN 0.2 09/03/2018 0824   NITRITE NEGATIVE 09/03/2018 0902   NITRITE Negative 09/03/2018 0824   LEUKOCYTESUR SMALL (A) 09/03/2018 0902   LEUKOCYTESUR Small (1+) (A) 09/03/2018 0824   Sepsis Labs Recent Labs  Lab 03/22/22 1854  WBC 7.3   Microbiology No results found for this or any previous visit (from the past 240 hour(s)).   Time coordinating discharge: Over 30 minutes  SIGNED:   Shawna Clamp, MD  [facility] 03/23/2022, 2:40 PM Pager   If 7PM-7AM, please  contact night-coverage [email or web address] Password [email or web address]

## 2023-06-21 ENCOUNTER — Other Ambulatory Visit (HOSPITAL_COMMUNITY): Payer: Self-pay

## 2023-07-05 ENCOUNTER — Emergency Department (HOSPITAL_COMMUNITY): Payer: Self-pay

## 2023-07-05 ENCOUNTER — Other Ambulatory Visit: Payer: Self-pay

## 2023-07-05 ENCOUNTER — Ambulatory Visit
Admission: EM | Admit: 2023-07-05 | Discharge: 2023-07-05 | Disposition: A | Discharge status: Home / Self Care | Payer: Self-pay

## 2023-07-05 ENCOUNTER — Emergency Department (HOSPITAL_COMMUNITY)
Admission: EM | Admit: 2023-07-05 | Discharge: 2023-07-05 | Disposition: A | Discharge status: Home / Self Care | Payer: Self-pay | Attending: Emergency Medicine | Admitting: Emergency Medicine

## 2023-07-05 DIAGNOSIS — L989 Disorder of the skin and subcutaneous tissue, unspecified: Secondary | ICD-10-CM | POA: Insufficient documentation

## 2023-07-05 DIAGNOSIS — Z79899 Other long term (current) drug therapy: Secondary | ICD-10-CM | POA: Insufficient documentation

## 2023-07-05 DIAGNOSIS — M25512 Pain in left shoulder: Secondary | ICD-10-CM | POA: Insufficient documentation

## 2023-07-05 DIAGNOSIS — I10 Essential (primary) hypertension: Secondary | ICD-10-CM | POA: Insufficient documentation

## 2023-07-05 DIAGNOSIS — R030 Elevated blood-pressure reading, without diagnosis of hypertension: Secondary | ICD-10-CM

## 2023-07-05 DIAGNOSIS — I161 Hypertensive emergency: Secondary | ICD-10-CM

## 2023-07-05 DIAGNOSIS — Z7982 Long term (current) use of aspirin: Secondary | ICD-10-CM | POA: Insufficient documentation

## 2023-07-05 LAB — URINALYSIS, W/ REFLEX TO CULTURE (INFECTION SUSPECTED)
Bilirubin Urine: NEGATIVE
Glucose, UA: NEGATIVE mg/dL
Hgb urine dipstick: NEGATIVE
Ketones, ur: NEGATIVE mg/dL
Leukocytes,Ua: NEGATIVE
Nitrite: NEGATIVE
Protein, ur: NEGATIVE mg/dL
Specific Gravity, Urine: 1.012 (ref 1.005–1.030)
pH: 7 (ref 5.0–8.0)

## 2023-07-05 LAB — RAPID URINE DRUG SCREEN, HOSP PERFORMED
Amphetamines: NOT DETECTED
Barbiturates: NOT DETECTED
Benzodiazepines: NOT DETECTED
Cocaine: NOT DETECTED
Opiates: NOT DETECTED
Tetrahydrocannabinol: NOT DETECTED

## 2023-07-05 LAB — COMPREHENSIVE METABOLIC PANEL WITH GFR
ALT: 15 U/L (ref 0–44)
AST: 18 U/L (ref 15–41)
Albumin: 4.1 g/dL (ref 3.5–5.0)
Alkaline Phosphatase: 86 U/L (ref 38–126)
Anion gap: 10 (ref 5–15)
BUN: 19 mg/dL (ref 6–20)
CO2: 28 mmol/L (ref 22–32)
Calcium: 9.5 mg/dL (ref 8.9–10.3)
Chloride: 99 mmol/L (ref 98–111)
Creatinine, Ser: 1.06 mg/dL — ABNORMAL HIGH (ref 0.44–1.00)
GFR, Estimated: >60 mL/min (ref >60)
Glucose, Bld: 101 mg/dL — ABNORMAL HIGH (ref 70–99)
Potassium: 3.3 mmol/L — ABNORMAL LOW (ref 3.5–5.1)
Sodium: 137 mmol/L (ref 135–145)
Total Bilirubin: 0.9 mg/dL (ref 0.0–1.2)
Total Protein: 8 g/dL (ref 6.5–8.1)

## 2023-07-05 LAB — CBC WITH DIFFERENTIAL/PLATELET
Abs Immature Granulocytes: 0.01 10*3/uL (ref 0.00–0.07)
Basophils Absolute: 0.1 10*3/uL (ref 0.0–0.1)
Basophils Relative: 2 %
Eosinophils Absolute: 0.2 10*3/uL (ref 0.0–0.5)
Eosinophils Relative: 3 %
HCT: 39.6 % (ref 36.0–46.0)
Hemoglobin: 13.1 g/dL (ref 12.0–15.0)
Immature Granulocytes: 0 %
Lymphocytes Relative: 48 %
Lymphs Abs: 2.9 10*3/uL (ref 0.7–4.0)
MCH: 29.8 pg (ref 26.0–34.0)
MCHC: 33.1 g/dL (ref 30.0–36.0)
MCV: 90 fL (ref 80.0–100.0)
Monocytes Absolute: 0.5 10*3/uL (ref 0.1–1.0)
Monocytes Relative: 9 %
Neutro Abs: 2.3 10*3/uL (ref 1.7–7.7)
Neutrophils Relative %: 38 %
Platelets: 377 10*3/uL (ref 150–400)
RBC: 4.4 MIL/uL (ref 3.87–5.11)
RDW: 12.8 % (ref 11.5–15.5)
WBC: 6 10*3/uL (ref 4.0–10.5)
nRBC: 0 % (ref 0.0–0.2)

## 2023-07-05 LAB — TROPONIN I (HIGH SENSITIVITY)
Troponin I (High Sensitivity): 15 ng/L (ref <18)
Troponin I (High Sensitivity): 22 ng/L — ABNORMAL HIGH (ref <18)

## 2023-07-05 MED ORDER — AMLODIPINE BESYLATE 10 MG PO TABS: 10.0000 mg | ORAL_TABLET | Freq: Every day | ORAL | 0 refills | Status: DC

## 2023-07-05 MED ORDER — VALSARTAN 320 MG PO TABS: 320.0000 mg | ORAL_TABLET | Freq: Every day | ORAL | 0 refills | Status: DC

## 2023-07-05 MED ORDER — LABETALOL HCL 5 MG/ML IV SOLN
5.0000 mg | Freq: Once | INTRAVENOUS | Status: AC
  Administered 2023-07-05: 5 mg via INTRAVENOUS
  Filled 2023-07-05: qty 4

## 2023-07-05 MED ORDER — POTASSIUM CHLORIDE CRYS ER 20 MEQ PO TBCR
40.0000 meq | EXTENDED_RELEASE_TABLET | Freq: Once | ORAL | Status: AC
  Administered 2023-07-05: 40 meq via ORAL
  Filled 2023-07-05: qty 2

## 2023-07-05 NOTE — ED Triage Notes (Signed)
 Pt sent by UC for hypertension. Pt states she took Losartan , amlodipine , and ASA this AM- has been back on medication for about a month after not taking them for a long time.

## 2023-07-05 NOTE — ED Triage Notes (Signed)
"  My bp is elevated , yesterday was 213/126". "I have been having pain in my left arm (deltoid area), I have been without my medicine (for my BP) for a while but back on it now due to insurance". "I took and OTC anxiety medication yesterday that I will not take again because it didn't make me feel well".

## 2023-07-05 NOTE — ED Provider Notes (Signed)
 [place] [facility] DEPARTMENT AT [facility] Provider Note   CSN: [id number] Arrival date & time: 07/05/23  1148     History  Chief Complaint  Patient presents with   Hypertension    Sara Norris is a 58 y.o. female.  58 year old female with prior medical history as detailed below presents for evaluation.  Patient with longstanding history of hypertension.  Patient reports that she has been checking her blood pressures intermittently over the last 6 months.  Her pressure typically is more than 200 systolic.  She has not been taking any medications for her blood pressure for at least 6 months.  This morning she became more concerned about her blood pressure.  She complains of left shoulder pain times 2 days.  She apparently took a "dose or 2" of her "niece's amlodipine  and losartan ."  She took these medications around 9 AM.  She does not know the doses.  She does not have a prescription for any antihypertensives currently.  She then went to the urgent care who referred her to the ED for evaluation of her left shoulder pain and remarkably elevated blood pressure.  She denies headache.  She denies current chest pain or shortness of breath.  She reports that her left shoulder has been "achy" for the last 2 to 3 days.  She is tearful and distraught during evaluation.  The history is provided by the patient.       Home Medications Prior to Admission medications   Medication Sig Start Date End Date Taking? Authorizing Provider  amLODipine  (NORVASC ) 10 MG tablet Take 1 tablet (10 mg total) by mouth daily. 03/23/22   Magdalene School, MD  aspirin  EC 81 MG tablet Take 81 mg by mouth daily.    [provider]  Blood Pressure Monitoring (BLOOD PRESSURE CUFF) MISC 1 Product by Does not apply route daily. MONITOR YOUR BLOOD PRESSURE 1-2 TIMES A DAY. 11/20/18   Avanell Leigh, MD  carvedilol  (COREG ) 25 MG tablet Take 1 tablet (25 mg total) by mouth 2 (two) times daily with a  meal. 03/23/22   Magdalene School, MD  cetirizine (ZYRTEC ALLERGY) 10 MG tablet 1 tablet Orally Once a day for 30 day(s) 04/15/15   [provider]  cetirizine (ZYRTEC ALLERGY) 10 MG tablet Take 10 mg by mouth daily. 04/15/15   [provider]  COVID-19 mRNA Vac-TriS, Pfizer, SUSP injection Inject into the muscle. 10/19/20   Liane Redman, MD  losartan  (COZAAR ) 50 MG tablet 1 tablet Orally Once a day    [provider]  losartan  (COZAAR ) 50 MG tablet Take 50 mg by mouth daily.    [provider]  permethrin (ELIMITE) 5 % cream 1 application to affected area Externally not for face Once a day for 1 day and repeat in 7 days 04/15/15   [provider]  UNABLE TO FIND Med Name: Anxiety and Stress Relief Natural Product    [provider]  valsartan  (DIOVAN ) 320 MG tablet Take 1 tablet (320 mg total) by mouth daily. 03/23/22   Magdalene School, MD  zolpidem  (AMBIEN ) 10 MG tablet Take 1 tablet (10 mg total) by mouth at bedtime as needed for sleep. 08/02/20 01/29/21  Paz, Jose E, MD      Allergies    Soy allergy (obsolete), Citrus, Codeine, and Pollen extract    Review of Systems   Review of Systems  All other systems reviewed and are negative.   Physical Exam Updated Vital Signs  BP (!) 251/125 (BP Location: Left Arm)   Pulse (!) 109   Temp 98 F (36.7 C) (Oral)   Resp 18   SpO2 100%  Physical Exam Vitals and nursing note reviewed.  Constitutional:      General: She is not in acute distress.    Appearance: Normal appearance. She is well-developed.  HENT:     Head: Normocephalic and atraumatic.  Eyes:     Conjunctiva/sclera: Conjunctivae normal.     Pupils: Pupils are equal, round, and reactive to light.  Cardiovascular:     Rate and Rhythm: Normal rate and regular rhythm.     Heart sounds: Normal heart sounds.  Pulmonary:     Effort: Pulmonary effort is normal. No respiratory distress.     Breath sounds: Normal breath sounds.   Abdominal:     General: There is no distension.     Palpations: Abdomen is soft.     Tenderness: There is no abdominal tenderness.  Musculoskeletal:        General: No deformity. Normal range of motion.     Cervical back: Normal range of motion and neck supple.  Skin:    General: Skin is warm and dry.  Neurological:     General: No focal deficit present.     Mental Status: She is alert and oriented to person, place, and time.     ED Results / Procedures / Treatments   Labs (all labs ordered are listed, but only abnormal results are displayed) Labs Reviewed  CBC WITH DIFFERENTIAL/PLATELET  COMPREHENSIVE METABOLIC PANEL WITH GFR  TROPONIN I (HIGH SENSITIVITY)    EKG None  Radiology No results found.  Procedures Procedures    Medications Ordered in ED Medications - No data to display  ED Course/ Medical Decision Making/ A&P                                 Medical Decision Making Amount and/or Complexity of Data Reviewed Labs: ordered. Radiology: ordered.  Risk Prescription drug management.    Medical Screen Complete  This patient presented to the ED with complaint of hypertension.  This complaint involves an extensive number of treatment options. The initial differential diagnosis includes, but is not limited to, hypertensive urgency, ACS, metabolic abnormality, etc.  This presentation is: Acute, Self-Limited, Previously Undiagnosed, Uncertain Prognosis, Complicated, Systemic Symptoms, and Threat to Life/Bodily Function  Patient with longstanding history of hypertension.  She reports noncompliance with previously prescribed medications.  Today she is presenting with remarkably elevated blood pressure.  Workup initiated to focus on evaluating for endorgan damage related to her elevated blood pressures.  Oncoming EDP is aware of case.    Additional history obtained:  External records from outside sources obtained and reviewed including prior ED  visits and prior Inpatient records.   Problem List / ED Course:  HTN, uncontrolled   Reevaluation:  After the interventions noted above, I reevaluated the patient and found that they have: improved  Disposition:  After consideration of the diagnostic results and the patients response to treatment, I feel that the patent would benefit from completion of ED evaluation.          Final Clinical Impression(s) / ED Diagnoses Final diagnoses:  Elevated blood pressure reading    Rx / DC Orders ED Discharge Orders     None         Burnette Carte, MD 07/05/23 1756

## 2023-07-05 NOTE — ED Notes (Signed)
 Pt in bed, pt states that she is ready to go home, pt reports a slight "nagging" headache.  Pt verbalized understanding d/c instructions and follow up, pt ambulatory from department.

## 2023-07-05 NOTE — ED Provider Notes (Signed)
 [facility]  Note:  This document was prepared using Conservation officer, historic buildings and may include unintentional dictation errors.  MRN: [id number] DOB: 02/06/1966  Subjective:   Sara Norris is a 58 y.o. female presenting for hypertension and left arm discomfort since yesterday.  Patient reports that her blood pressure was 113/126 yesterday.  Patient states that for a while she was out of her blood pressure medication and was having difficulty with insurance refilling her prescription.  Patient is back on blood pressure medication however increased stress due to taking care of her sick mother and increased work stress have affected her stress and anxiety.  Patient reports that she took an over-the-counter anxiety medication yesterday which made her feel worse.  Patient denies any chest pain, shortness of breath, weakness, dizziness at this time.  No current facility-administered medications for this encounter.  Current Outpatient Medications:    cetirizine (ZYRTEC ALLERGY) 10 MG tablet, 1 tablet Orally Once a day for 30 day(s), Disp: , Rfl:    cetirizine (ZYRTEC ALLERGY) 10 MG tablet, Take 10 mg by mouth daily., Disp: , Rfl:    permethrin (ELIMITE) 5 % cream, 1 application to affected area Externally not for face Once a day for 1 day and repeat in 7 days, Disp: , Rfl:    UNABLE TO FIND, Med Name: Anxiety and Stress Relief Natural Product, Disp: , Rfl:    amLODipine  (NORVASC ) 10 MG tablet, Take 1 tablet (10 mg total) by mouth daily., Disp: 30 tablet, Rfl: 3   aspirin  EC 81 MG tablet, Take 81 mg by mouth daily., Disp: , Rfl:    Blood Pressure Monitoring (BLOOD PRESSURE CUFF) MISC, 1 Product by Does not apply route daily. MONITOR YOUR BLOOD PRESSURE 1-2 TIMES A DAY., Disp: 1 each, Rfl: 0   carvedilol  (COREG ) 25 MG tablet, Take 1 tablet (25 mg total) by mouth 2 (two) times daily with a meal., Disp: 60 tablet, Rfl: 3   COVID-19 mRNA Vac-TriS, Pfizer, SUSP injection, Inject into the  muscle., Disp: 0.3 mL, Rfl: 0   losartan  (COZAAR ) 50 MG tablet, 1 tablet Orally Once a day, Disp: , Rfl:    losartan  (COZAAR ) 50 MG tablet, Take 50 mg by mouth daily., Disp: , Rfl:    valsartan  (DIOVAN ) 320 MG tablet, Take 1 tablet (320 mg total) by mouth daily., Disp: 90 tablet, Rfl: 3   zolpidem  (AMBIEN ) 10 MG tablet, Take 1 tablet (10 mg total) by mouth at bedtime as needed for sleep., Disp: 30 tablet, Rfl: 0   Allergies  Allergen Reactions   Soy Allergy (Obsolete)     Itching of throat.   Citrus Rash   Codeine Rash   Pollen Extract     sneezing    Past Medical History:  Diagnosis Date   Allergy    seasonal   Chronic knee pain    Colonic diverticular abscess    Contraception    menopausal   Diverticulosis    Hiatal hernia    per pt, not aware of this.   Hypertension    dx age 89   Menopause    LMP age 42   Umbilical hernia    per pt/ nopt aware of this.     Past Surgical History:  Procedure Laterality Date   CHOLECYSTECTOMY     MYOMECTOMY     removed fibroids/ when she was in her 61"s   TONSILLECTOMY      Family History  Problem Relation Age of Onset  Hypertension Mother    Diabetes Mother    Arthritis Mother    Gout Mother    Hypertension Father    Diabetes Father        GP   CAD Maternal Grandmother    Hypertension Other        GP   Stroke Neg Hx    Colon cancer Neg Hx    Breast cancer Neg Hx     Social History   Tobacco Use   Smoking status: Never   Smokeless tobacco: Never  Vaping Use   Vaping status: Never Used  Substance Use Topics   Alcohol use: Yes    Comment: socially.   Drug use: Not Currently    Comment: no marijuana ;lately     ROS Refer to HPI for ROS details.  Objective:   Vitals: BP (!) 214/130 (BP Location: Left Arm)   Pulse 79   Temp 98.4 F (36.9 C) (Oral)   Resp 18   Ht 5\' 6"  (1.676 m)   Wt 207 lb 14.3 oz (94.3 kg)   SpO2 97%   BMI 33.55 kg/m   Physical Exam Vitals and nursing note reviewed.   Constitutional:      General: She is not in acute distress.    Appearance: She is well-developed. She is not ill-appearing or toxic-appearing.  HENT:     Head: Normocephalic and atraumatic.     Mouth/Throat:     Mouth: Mucous membranes are moist.  Eyes:     General:        Right eye: No discharge.        Left eye: No discharge.     Extraocular Movements: Extraocular movements intact.     Conjunctiva/sclera: Conjunctivae normal.  Cardiovascular:     Rate and Rhythm: Normal rate and regular rhythm.     Heart sounds: Normal heart sounds. No murmur heard. Pulmonary:     Effort: Pulmonary effort is normal. No respiratory distress.     Breath sounds: Normal breath sounds. No stridor. No wheezing, rhonchi or rales.  Chest:     Chest wall: No tenderness.  Musculoskeletal:     Left upper arm: Tenderness present. No swelling, deformity or bony tenderness.  Skin:    General: Skin is warm and dry.  Neurological:     General: No focal deficit present.     Mental Status: She is alert and oriented to person, place, and time.  Psychiatric:        Mood and Affect: Mood normal.        Behavior: Behavior normal.     Procedures  No results found for this or any previous visit (from the past 24 hours).  No results found.   Assessment and Plan :     Discharge Instructions       1. Hypertensive emergency (Primary) - ED EKG completed in UC shows normal sinus rhythm with left ventricular hypertrophy and prolonged QT, abnormal EKG, ventricular rate of 75 bpm, no STEMI. - Based on severely elevated blood pressure and slightly abnormal EKG, recommend follow-up in emergency department for further evaluation and management. - Full evaluation requires advanced imaging and stat laboratory testing which are not available in urgent care setting. - Please go directly to [facility] emergency room after leaving urgent care for further evaluation management.     Khiyan Crace B Mervil Wacker   Diallo Ponder,  [place], [place] 07/05/23 1137

## 2023-07-05 NOTE — Discharge Instructions (Signed)
 Your blood pressure today was reassuring.  Please follow-up with your doctor to have your blood pressure rechecked.  I have sent refills on your valsartan  and amlodipine .  Please start back on these.  Return to the ER for worsening symptoms.

## 2023-07-05 NOTE — ED Notes (Signed)
 Patient is being discharged from the Urgent Care and sent to the Emergency Department via Private Vehicle (Self) . Per Provider, patient is in need of higher level of care due to Hypertensive Crisis. Patient is aware and verbalizes understanding of plan of care.  Vitals:   07/05/23 1044  BP: (!) 214/130  Pulse: 79  Resp: 18  Temp: 98.4 F (36.9 C)  SpO2: 97%

## 2023-07-05 NOTE — Discharge Instructions (Signed)
  1. Hypertensive emergency (Primary) - ED EKG completed in UC shows normal sinus rhythm with left ventricular hypertrophy and prolonged QT, abnormal EKG, ventricular rate of 75 bpm, no STEMI. - Based on severely elevated blood pressure and slightly abnormal EKG, recommend follow-up in emergency department for further evaluation and management. - Full evaluation requires advanced imaging and stat laboratory testing which are not available in urgent care setting. - Please go directly to [facility] emergency room after leaving urgent care for further evaluation management.

## 2023-07-05 NOTE — ED Provider Notes (Addendum)
 I received the patient in signout presenting with elevated blood pressure today.  She was denying chest pain but was complaining of some atypical left shoulder pain.  Plan is for reevaluation after delta troponin for ultimate disposition. Physical Exam  BP (!) 195/111 (BP Location: Right Arm)   Pulse 74   Temp 97.9 F (36.6 C) (Oral)   Resp 18   SpO2 97%   Physical Exam Gen: NAD  Procedures  Procedures  ED Course / MDM    Medical Decision Making Amount and/or Complexity of Data Reviewed Labs: ordered. Radiology: ordered.  Risk Prescription drug management.   On reassessment the patient's blood pressures improved to the 180s systolic.  She is feeling well.  I think that she is stable for discharge.  Her second troponin is essentially flat compared to the first.  I will refill her blood pressure medications.  She is encouraged to follow-up with her primary care provider.  She states that she does have a new doctor.  She will follow-up.       Carin Charleston, MD 07/05/23 1753    Carin Charleston, MD 07/05/23 [phone]

## 2023-07-25 ENCOUNTER — Ambulatory Visit
Admission: RE | Admit: 2023-07-25 | Discharge: 2023-07-25 | Disposition: A | Discharge status: Home / Self Care | Source: Ambulatory Visit | Attending: Family Medicine | Admitting: Family Medicine

## 2023-07-25 ENCOUNTER — Ambulatory Visit (INDEPENDENT_AMBULATORY_CARE_PROVIDER_SITE_OTHER)

## 2023-07-25 VITALS — BP 186/120 | HR 82 | Temp 97.5°F | Resp 16

## 2023-07-25 DIAGNOSIS — M25512 Pain in left shoulder: Secondary | ICD-10-CM

## 2023-07-25 DIAGNOSIS — I10 Essential (primary) hypertension: Secondary | ICD-10-CM

## 2023-07-25 MED ORDER — METHYLPREDNISOLONE 4 MG PO TBPK: ORAL_TABLET | ORAL | 0 refills | Status: DC

## 2023-07-25 MED ORDER — TIZANIDINE HCL 4 MG PO TABS: 4.0000 mg | ORAL_TABLET | Freq: Three times a day (TID) | ORAL | 0 refills | Status: DC | PRN

## 2023-07-25 NOTE — Discharge Instructions (Signed)
 Your blood pressure was noted to be elevated during your visit today. If you are currently taking medication for high blood pressure, please ensure you are taking this as directed. If you do not have a history of high blood pressure and your blood pressure remains persistently elevated, you may need to begin taking a medication at some point. You may return here within the next few days to recheck if unable to see your primary care provider or if you do not have a one.  BP (!) 186/120 (BP Location: Right Arm) Comment: pt's pressures have been high, still needs to establish PCP  Pulse 82   Temp (!) 97.5 F (36.4 C) (Oral)   Resp 16   SpO2 98%   BP Readings from Last 3 Encounters:  07/25/23 (!) 186/120  07/05/23 (!) 171/104  07/05/23 (!) 214/130

## 2023-07-25 NOTE — ED Triage Notes (Addendum)
 Pt reports L shoulder pain x 2 months. Pt reports throbbing constant pain from shoulder to L wrist. Reports she has been sleeping in a recliner and wonders if her sleeping position is causing pain. No relief with extra strength tylenol  and lidocaine pain patches.   BP very elevated in triage. Denies dizziness, lightheadedness, or vision changes. Pt still needs to establish PCP to restart carvedilol .

## 2023-07-25 NOTE — ED Provider Notes (Signed)
 [facility]   [id number] 07/25/23 Arrival Time: 1602  ASSESSMENT & PLAN:  1. Left shoulder pain, unspecified chronicity   2. Elevated blood pressure reading with diagnosis of hypertension    I have personally viewed and independently interpreted the imaging studies ordered this visit. L shoulder: no acute bony changes.  Trial of: New Prescriptions   METHYLPREDNISOLONE (MEDROL DOSEPAK) 4 MG TBPK TABLET    Take as directed.   TIZANIDINE  (ZANAFLEX ) 4 MG TABLET    Take 1 tablet (4 mg total) by mouth every 8 (eight) hours as needed for muscle spasms.    Orders Placed This Encounter  Procedures   DG Shoulder Left   Work/school excuse note: not needed. Recommend:  Follow-up Information     [facility]   Specialty: Specialist Why: If shoulder pain is worsening or failing to improve as anticipated. Contact information: [address] [place] [address] [phone]                Nurse to help pt est PCP.    Discharge Instructions      Your blood pressure was noted to be elevated during your visit today. If you are currently taking medication for high blood pressure, please ensure you are taking this as directed. If you do not have a history of high blood pressure and your blood pressure remains persistently elevated, you may need to begin taking a medication at some point. You may return here within the next few days to recheck if unable to see your primary care provider or if you do not have a one.  BP (!) 186/120 (BP Location: Right Arm) Comment: pt's pressures have been high, still needs to establish PCP  Pulse 82   Temp (!) 97.5 F (36.4 C) (Oral)   Resp 16   SpO2 98%   BP Readings from Last 3 Encounters:  07/25/23 (!) 186/120  07/05/23 (!) 171/104  07/05/23 (!) 214/130      Reviewed expectations re: course of current medical issues. Questions answered. Outlined signs and symptoms indicating need for more acute  intervention. Patient verbalized understanding. After Visit Summary given.  SUBJECTIVE: History from: patient. Sara Norris is a 58 y.o. female who reports L shoulder pain "like a toothache"; gradual onset; first noted 2 mos ago; denies injury/trauma; occas pain that radiates to L wrist. Tylenol  and lidocaine patches without relief. Increased blood pressure noted today. Reports that she is treated for HTN.  She reports taking medications as instructed  Past Surgical History:  Procedure Laterality Date   CHOLECYSTECTOMY     MYOMECTOMY     removed fibroids/ when she was in her 98"s   TONSILLECTOMY        OBJECTIVE:  Vitals:   07/25/23 1617  BP: (!) 186/120  Pulse: 82  Resp: 16  Temp: (!) 97.5 F (36.4 C)  TempSrc: Oral  SpO2: 98%    General appearance: alert; no distress HEENT: [place]; AT Neck: supple with FROM Resp: unlabored respirations Extremities: LUE: warm with well perfused appearance; poorly localized mild tenderness over right anterior/lateral shoulder into biceps; without gross deformities; swelling: none; bruising: none; shoulder ROM: normal, with discomfort CV: brisk extremity capillary refill of LUE; 2+ radial pulse of LUE. Skin: warm and dry; no visible rashes Neurologic: gait normal; normal sensation and strength of LUE Psychological: alert and cooperative; normal mood and affect  Imaging: DG Shoulder Left Result Date: 07/25/2023 CLINICAL DATA:  Left shoulder pain for 2 months. EXAM: LEFT  SHOULDER - 2+ VIEW COMPARISON:  None Available. FINDINGS: No acute osseous or joint abnormality. Minimal degenerative change in the left acromioclavicular joint. Visualized left chest is grossly unremarkable. IMPRESSION: 1. No acute findings. 2. Minimal left acromioclavicular joint osteoarthritis. Electronically Signed   By: Shearon Denis M.D.   On: 07/25/2023 16:48      Allergies  Allergen Reactions   Soy Allergy (Obsolete) Itching and Other (See Comments)    Itching  of the throat   Other Other (See Comments)    NO FOODS WITH SEEDS!!! History of Diverticulitis   Peanut-Containing Drug Products Other (See Comments)    History of Diverticulitis   Citrus Rash   Codeine Rash   Pollen Extract Other (See Comments)    Sneezing, watery eyes    Past Medical History:  Diagnosis Date   Allergy    seasonal   Chronic knee pain    Colonic diverticular abscess    Contraception    menopausal   Diverticulosis    Hiatal hernia    per pt, not aware of this.   Hypertension    dx age 19   Menopause    LMP age 89   Umbilical hernia    per pt/ nopt aware of this.   Social History   Socioeconomic History   Marital status: Single    Spouse name: Not on file   Number of children: 1   Years of education: Not on file   Highest education level: Not on file  Occupational History   Occupation: enviromental services     Employer: [employer]  Tobacco Use   Smoking status: Never   Smokeless tobacco: Never  Vaping Use   Vaping status: Never Used  Substance and Sexual Activity   Alcohol use: Yes    Comment: socially.   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 - [employer]   Child lives independently   1 g-child     Social Drivers of Health   Financial Resource Strain: Not on file  Food Insecurity: No Food Insecurity (03/23/2022)   Hunger Vital Sign    Worried About Running Out of Food in the Last Year: Never true    Ran Out of Food in the Last Year: Never true  Transportation Needs: No Transportation Needs (03/23/2022)   PRAPARE - Administrator, Civil Service (Medical): No    Lack of Transportation (Non-Medical): No  Physical Activity: Not on file  Stress: Not on file  Social Connections: Not on file   Family History   Problem Relation Age of Onset   Hypertension Mother    Diabetes Mother    Arthritis Mother    Gout Mother    Hypertension Father    Diabetes Father        GP   CAD Maternal Grandmother    Hypertension Other        GP   Stroke Neg Hx    Colon cancer Neg Hx    Breast cancer Neg Hx    Past Surgical History:  Procedure Laterality Date   CHOLECYSTECTOMY     MYOMECTOMY     removed fibroids/ when she was in her 18"s   TONSILLECTOMY  Afton Albright, MD 07/25/23 [phone]

## 2023-10-08 ENCOUNTER — Ambulatory Visit (INDEPENDENT_AMBULATORY_CARE_PROVIDER_SITE_OTHER): Payer: Self-pay | Admitting: Family Medicine

## 2023-10-08 ENCOUNTER — Encounter: Payer: Self-pay | Admitting: Family Medicine

## 2023-10-08 VITALS — BP 254/135 | HR 83 | Ht 66.0 in | Wt 214.0 lb

## 2023-10-08 DIAGNOSIS — Z1231 Encounter for screening mammogram for malignant neoplasm of breast: Secondary | ICD-10-CM

## 2023-10-08 DIAGNOSIS — I10 Essential (primary) hypertension: Secondary | ICD-10-CM

## 2023-10-08 MED ORDER — VALSARTAN 320 MG PO TABS: 320.0000 mg | ORAL_TABLET | Freq: Every day | ORAL | 0 refills | Status: AC

## 2023-10-08 MED ORDER — AMLODIPINE BESYLATE 10 MG PO TABS: 10.0000 mg | ORAL_TABLET | Freq: Every day | ORAL | 0 refills | Status: DC

## 2023-10-08 MED ORDER — CARVEDILOL 25 MG PO TABS: 25.0000 mg | ORAL_TABLET | Freq: Two times a day (BID) | ORAL | 0 refills | Status: DC

## 2023-10-08 NOTE — Progress Notes (Signed)
 New Patient Office Visit  Subjective    Patient ID: Sara Norris, female    DOB: 1965/06/07  Age: 58 y.o. MRN: [id number]  CC:  Chief Complaint  Patient presents with   Establish Care    Pt reports she has high blood pressure and needs new prescription. Pt reports insomnia and sleep apnea. Pt also reports diabetes runs in her family but she has never been checked for it.     HPI [name] presents to establish care and for review of hypertension. Patient reports that she has not had her meds for weeks/months.    Outpatient Encounter Medications as of 10/08/2023  Medication Sig   aspirin  EC 81 MG tablet Take 81 mg by mouth daily.   Blood Pressure Monitoring (BLOOD PRESSURE CUFF) MISC 1 Product by Does not apply route daily. MONITOR YOUR BLOOD PRESSURE 1-2 TIMES A DAY.   tiZANidine  (ZANAFLEX ) 4 MG tablet Take 1 tablet (4 mg total) by mouth every 8 (eight) hours as needed for muscle spasms.   [DISCONTINUED] valsartan  (DIOVAN ) 320 MG tablet Take 1 tablet (320 mg total) by mouth daily.   amLODipine  (NORVASC ) 10 MG tablet Take 1 tablet (10 mg total) by mouth daily.   carvedilol  (COREG ) 25 MG tablet Take 1 tablet (25 mg total) by mouth 2 (two) times daily with a meal.   COVID-19 mRNA Vac-TriS, Pfizer, SUSP injection Inject into the muscle. (Patient not taking: Reported on 07/05/2023)   methylPREDNISolone  (MEDROL  DOSEPAK) 4 MG TBPK tablet Take as directed. (Patient not taking: Reported on 10/08/2023)   valsartan  (DIOVAN ) 320 MG tablet Take 1 tablet (320 mg total) by mouth daily.   zolpidem  (AMBIEN ) 10 MG tablet Take 1 tablet (10 mg total) by mouth at bedtime as needed for sleep. (Patient not taking: Reported on 10/08/2023)   [DISCONTINUED] amLODipine  (NORVASC ) 10 MG tablet Take 1 tablet (10 mg total) by mouth daily.   [DISCONTINUED] carvedilol  (COREG ) 25 MG tablet Take 1 tablet (25 mg total) by mouth 2 (two) times daily with a meal. (Patient not taking: Reported on 07/05/2023)   No  facility-administered encounter medications on file as of 10/08/2023.    Past Medical History:  Diagnosis Date   Allergy    seasonal   Chronic knee pain    Colonic diverticular abscess    Contraception    menopausal   Diverticulosis    Hiatal hernia    per pt, not aware of this.   Hypertension    dx age 23   Menopause    LMP age 37   Umbilical hernia    per pt/ nopt aware of this.    Past Surgical History:  Procedure Laterality Date   CHOLECYSTECTOMY     MYOMECTOMY     removed fibroids/ when she was in her 31s   TONSILLECTOMY      Family History  Problem Relation Age of Onset   Hypertension Mother    Diabetes Mother    Arthritis Mother    Gout Mother    Hypertension Father    Diabetes Father        GP   CAD Maternal Grandmother    Hypertension Other        GP   Stroke Neg Hx    Colon cancer Neg Hx    Breast cancer Neg Hx     Social History   Socioeconomic History   Marital status: Single    Spouse name: Not on file   Number of  children: 1   Years of education: Not on file   Highest education level: Not on file  Occupational History   Occupation: enviromental services     Employer: [employer]  Tobacco Use   Smoking status: Never   Smokeless tobacco: Never  Vaping Use   Vaping status: Never Used  Substance and Sexual Activity   Alcohol use: Yes    Comment: socially.   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 - [employer]   Child lives independently   1 g-child     Social Drivers of Health   Financial Resource Strain: Not on file  Food Insecurity: No Food Insecurity (03/23/2022)   Hunger Vital Sign    Worried About Running Out of Food in the Last Year: Never true    Ran Out of Food in the Last Year: Never true  Transportation  Needs: No Transportation Needs (03/23/2022)   PRAPARE - Administrator, Civil Service (Medical): No    Lack of Transportation (Non-Medical): No  Physical Activity: Not on file  Stress: Not on file  Social Connections: Not on file  Intimate Partner Violence: Not At Risk (03/23/2022)   Humiliation, Afraid, Rape, and Kick questionnaire    Fear of Current or Ex-Partner: No    Emotionally Abused: No    Physically Abused: No    Sexually Abused: No    Review of Systems  All other systems reviewed and are negative.       Objective   BP (!) 254/135   Pulse 83   Ht 5' 6 (1.676 m)   Wt 214 lb (97.1 kg)   SpO2 95%   BMI 34.54 kg/m   Physical Exam Vitals and nursing note reviewed.  Constitutional:      General: She is not in acute distress. Cardiovascular:     Rate and Rhythm: Normal rate and regular rhythm.  Pulmonary:     Effort: Pulmonary effort is normal.     Breath sounds: Normal breath sounds.  Abdominal:     Palpations: Abdomen is soft.     Tenderness: There is no abdominal tenderness.  Neurological:     General: No focal deficit present.     Mental Status: She is alert and oriented to person, place, and time.         Assessment & Plan:   Uncontrolled hypertension  Encounter for screening mammogram for malignant neoplasm of breast  Other orders -     amLODIPine  Besylate; Take 1 tablet (10 mg total) by mouth daily.  Dispense: 90 tablet; Refill: 0 -     Valsartan ; Take 1 tablet (320 mg total) by mouth daily.  Dispense: 90 tablet; Refill: 0 -     Carvedilol ; Take 1 tablet (25 mg total) by mouth 2 (two) times daily with a meal.  Dispense: 180 tablet; Refill: 0     Return in about 2 weeks (around 10/22/2023) for follow up.   [name], MD

## 2023-10-19 ENCOUNTER — Ambulatory Visit (INDEPENDENT_AMBULATORY_CARE_PROVIDER_SITE_OTHER): Admitting: Family Medicine

## 2023-10-19 ENCOUNTER — Encounter: Payer: Self-pay | Admitting: Family Medicine

## 2023-10-19 VITALS — BP 188/108 | HR 69 | Ht 66.0 in | Wt 214.8 lb

## 2023-10-19 DIAGNOSIS — I10 Essential (primary) hypertension: Secondary | ICD-10-CM | POA: Diagnosis not present

## 2023-10-19 MED ORDER — TRIAMTERENE-HCTZ 37.5-25 MG PO TABS: 1.0000 | ORAL_TABLET | Freq: Every day | ORAL | 0 refills | Status: DC

## 2023-10-19 NOTE — Progress Notes (Signed)
 Established Patient Office Visit  Subjective    Patient ID: Sara Norris, female    DOB: 1965/07/08  Age: 58 y.o. MRN: [id number]  CC:  Chief Complaint  Patient presents with   Medical Management of Chronic Issues    HPI Sara Norris presents for follow up of hypertension. Patient reports med compliance and denies acute complaints.   Outpatient Encounter Medications as of 10/19/2023  Medication Sig   amLODipine  (NORVASC ) 10 MG tablet Take 1 tablet (10 mg total) by mouth daily.   aspirin  EC 81 MG tablet Take 81 mg by mouth daily.   Blood Pressure Monitoring (BLOOD PRESSURE CUFF) MISC 1 Product by Does not apply route daily. MONITOR YOUR BLOOD PRESSURE 1-2 TIMES A DAY.   carvedilol  (COREG ) 25 MG tablet Take 1 tablet (25 mg total) by mouth 2 (two) times daily with a meal.   tiZANidine  (ZANAFLEX ) 4 MG tablet Take 1 tablet (4 mg total) by mouth every 8 (eight) hours as needed for muscle spasms.   triamterene -hydrochlorothiazide  (MAXZIDE-25) 37.5-25 MG tablet Take 1 tablet by mouth daily.   valsartan  (DIOVAN ) 320 MG tablet Take 1 tablet (320 mg total) by mouth daily.   COVID-19 mRNA Vac-TriS, Pfizer, SUSP injection Inject into the muscle. (Patient not taking: Reported on 07/05/2023)   methylPREDNISolone  (MEDROL  DOSEPAK) 4 MG TBPK tablet Take as directed. (Patient not taking: Reported on 10/08/2023)   zolpidem  (AMBIEN ) 10 MG tablet Take 1 tablet (10 mg total) by mouth at bedtime as needed for sleep. (Patient not taking: Reported on 10/19/2023)   No facility-administered encounter medications on file as of 10/19/2023.    Past Medical History:  Diagnosis Date   Allergy    seasonal   Chronic knee pain    Colonic diverticular abscess    Contraception    menopausal   Diverticulosis    Hiatal hernia    per pt, not aware of this.   Hypertension    dx age 72   Menopause    LMP age 33   Umbilical hernia    per pt/ nopt aware of this.    Past Surgical History:  Procedure Laterality  Date   CHOLECYSTECTOMY     MYOMECTOMY     removed fibroids/ when she was in her 53s   TONSILLECTOMY      Family History  Problem Relation Age of Onset   Hypertension Mother    Diabetes Mother    Arthritis Mother    Gout Mother    Hypertension Father    Diabetes Father        GP   CAD Maternal Grandmother    Hypertension Other        GP   Stroke Neg Hx    Colon cancer Neg Hx    Breast cancer Neg Hx     Social History   Socioeconomic History   Marital status: Single    Spouse name: Not on file   Number of children: 1   Years of education: Not on file   Highest education level: Not on file  Occupational History   Occupation: enviromental services     Employer: [employer]  Tobacco Use   Smoking status: Never   Smokeless tobacco: Never  Vaping Use   Vaping status: Never Used  Substance and Sexual Activity   Alcohol use: Yes    Comment: socially.   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 - [facility]   Child lives independently   1 g-child     Social Drivers of Health   Financial Resource Strain: Not on file  Food Insecurity: No Food Insecurity (03/23/2022)   Hunger Vital Sign    Worried About Running Out of Food in the Last Year: Never true    Ran Out of Food in the Last Year: Never true  Transportation Needs: No Transportation Needs (03/23/2022)   PRAPARE - Administrator, Civil Service (Medical): No    Lack of Transportation (Non-Medical): No  Physical Activity: Not on file  Stress: Not on file  Social Connections: Not on file  Intimate Partner Violence: Not At Risk (03/23/2022)   Humiliation, Afraid, Rape, and Kick questionnaire    Fear of Current or Ex-Partner: No    Emotionally Abused: No    Physically Abused: No    Sexually  Abused: No    Review of Systems  All other systems reviewed and are negative.       Objective    BP (!) 188/108   Pulse 69   Ht 5' 6 (1.676 m)   Wt 214 lb 12.8 oz (97.4 kg)   SpO2 95%   BMI 34.67 kg/m   Physical Exam Vitals and nursing note reviewed.  Constitutional:      General: She is not in acute distress. Cardiovascular:     Rate and Rhythm: Normal rate and regular rhythm.  Pulmonary:     Effort: Pulmonary effort is normal.     Breath sounds: Normal breath sounds.  Abdominal:     Palpations: Abdomen is soft.     Tenderness: There is no abdominal tenderness.  Neurological:     General: No focal deficit present.     Mental Status: She is alert and oriented to person, place, and time.         Assessment & Plan:   Uncontrolled hypertension  Other orders -     Triamterene -HCTZ; Take 1 tablet by mouth daily.  Dispense: 90 tablet; Refill: 0     Return in about 2 weeks (around 11/02/2023) for follow up, chronic med issues.   [name], MD

## 2023-11-02 ENCOUNTER — Ambulatory Visit (INDEPENDENT_AMBULATORY_CARE_PROVIDER_SITE_OTHER): Payer: Self-pay | Admitting: Family Medicine

## 2023-11-02 VITALS — BP 199/107 | HR 90 | Ht 66.0 in | Wt 209.6 lb

## 2023-11-02 DIAGNOSIS — M25512 Pain in left shoulder: Secondary | ICD-10-CM

## 2023-11-02 DIAGNOSIS — G8929 Other chronic pain: Secondary | ICD-10-CM

## 2023-11-02 DIAGNOSIS — I10 Essential (primary) hypertension: Secondary | ICD-10-CM

## 2023-11-02 DIAGNOSIS — J309 Allergic rhinitis, unspecified: Secondary | ICD-10-CM

## 2023-11-02 MED ORDER — FLUTICASONE PROPIONATE 50 MCG/ACT NA SUSP: 2.0000 | Freq: Every day | NASAL | 6 refills | Status: DC

## 2023-11-02 MED ORDER — DICLOFENAC SODIUM 1 % EX GEL: 4.0000 g | Freq: Four times a day (QID) | CUTANEOUS | 1 refills | Status: AC

## 2023-11-05 ENCOUNTER — Encounter: Payer: Self-pay | Admitting: Family Medicine

## 2023-11-05 NOTE — Progress Notes (Signed)
 Established Patient Office Visit  Subjective    Patient ID: Sara Norris, female    DOB: 02/09/1966  Age: 58 y.o. MRN: [id number]  CC:  Chief Complaint  Patient presents with   Medical Management of Chronic Issues    Pt reports having issues with her eyes - think it has something to do with her medication  Also having some shoulder pain     HPI Sara Norris presents for routine follow up of hypertension. Patient reports med compliance. Patient also allergy symptoms and increasing left shoulder pain.   Outpatient Encounter Medications as of 11/02/2023  Medication Sig   amLODipine  (NORVASC ) 10 MG tablet Take 1 tablet (10 mg total) by mouth daily.   aspirin  EC 81 MG tablet Take 81 mg by mouth daily.   carvedilol  (COREG ) 25 MG tablet Take 1 tablet (25 mg total) by mouth 2 (two) times daily with a meal.   diclofenac  Sodium (VOLTAREN ) 1 % GEL Apply 4 g topically 4 (four) times daily.   fluticasone  (FLONASE ) 50 MCG/ACT nasal spray Place 2 sprays into both nostrils daily.   triamterene -hydrochlorothiazide  (MAXZIDE-25) 37.5-25 MG tablet Take 1 tablet by mouth daily.   valsartan  (DIOVAN ) 320 MG tablet Take 1 tablet (320 mg total) by mouth daily.   Blood Pressure Monitoring (BLOOD PRESSURE CUFF) MISC 1 Product by Does not apply route daily. MONITOR YOUR BLOOD PRESSURE 1-2 TIMES A DAY.   COVID-19 mRNA Vac-TriS, Pfizer, SUSP injection Inject into the muscle. (Patient not taking: Reported on 07/05/2023)   methylPREDNISolone  (MEDROL  DOSEPAK) 4 MG TBPK tablet Take as directed. (Patient not taking: Reported on 10/08/2023)   tiZANidine  (ZANAFLEX ) 4 MG tablet Take 1 tablet (4 mg total) by mouth every 8 (eight) hours as needed for muscle spasms.   zolpidem  (AMBIEN ) 10 MG tablet Take 1 tablet (10 mg total) by mouth at bedtime as needed for sleep. (Patient not taking: Reported on 10/19/2023)   No facility-administered encounter medications on file as of 11/02/2023.    Past Medical History:  Diagnosis  Date   Allergy    seasonal   Chronic knee pain    Colonic diverticular abscess    Contraception    menopausal   Diverticulosis    Hiatal hernia    per pt, not aware of this.   Hypertension    dx age 70   Menopause    LMP age 54   Umbilical hernia    per pt/ nopt aware of this.    Past Surgical History:  Procedure Laterality Date   CHOLECYSTECTOMY     MYOMECTOMY     removed fibroids/ when she was in her 38s   TONSILLECTOMY      Family History  Problem Relation Age of Onset   Hypertension Mother    Diabetes Mother    Arthritis Mother    Gout Mother    Hypertension Father    Diabetes Father        GP   CAD Maternal Grandmother    Hypertension Other        GP   Stroke Neg Hx    Colon cancer Neg Hx    Breast cancer Neg Hx     Social History   Socioeconomic History   Marital status: Single    Spouse name: Not on file   Number of children: 1   Years of education: Not on file   Highest education level: Not on file  Occupational History   Occupation: enviromental services  Employer: [employer]  Tobacco Use   Smoking status: Never   Smokeless tobacco: Never  Vaping Use   Vaping status: Never Used  Substance and Sexual Activity   Alcohol use: Yes    Comment: socially.   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 - [facility]   Child lives independently   1 g-child     Social Drivers of Health   Financial Resource Strain: Not on file  Food Insecurity: No Food Insecurity (03/23/2022)   Hunger Vital Sign    Worried About Running Out of Food in the Last Year: Never true    Ran Out of Food in the Last Year: Never true  Transportation Needs: No Transportation Needs (03/23/2022)   PRAPARE - Administrator, Civil Service (Medical): No     Lack of Transportation (Non-Medical): No  Physical Activity: Not on file  Stress: Not on file  Social Connections: Not on file  Intimate Partner Violence: Not At Risk (03/23/2022)   Humiliation, Afraid, Rape, and Kick questionnaire    Fear of Current or Ex-Partner: No    Emotionally Abused: No    Physically Abused: No    Sexually Abused: No    Review of Systems  All other systems reviewed and are negative.       Objective    BP (!) 199/107   Pulse 90   Ht 5' 6 (1.676 m)   Wt 209 lb 9.6 oz (95.1 kg)   SpO2 98%   BMI 33.83 kg/m   Physical Exam Vitals and nursing note reviewed.  Constitutional:      General: She is not in acute distress. HENT:     Nose: Congestion present.  Cardiovascular:     Rate and Rhythm: Normal rate and regular rhythm.  Pulmonary:     Effort: Pulmonary effort is normal.     Breath sounds: Normal breath sounds.  Abdominal:     Palpations: Abdomen is soft.     Tenderness: There is no abdominal tenderness.  Musculoskeletal:     Left shoulder: Tenderness present. No deformity. Decreased range of motion.  Neurological:     General: No focal deficit present.     Mental Status: She is alert and oriented to person, place, and time.         Assessment & Plan:   Uncontrolled hypertension -     Ambulatory referral to Cardiology  Chronic left shoulder pain  Allergic rhinitis, unspecified seasonality, unspecified trigger  Other orders -     Diclofenac  Sodium; Apply 4 g topically 4 (four) times daily.  Dispense: 100 g; Refill: 1 -     Fluticasone  Propionate; Place 2 sprays into both nostrils daily.  Dispense: 16 g; Refill: 6     Return in about 2 weeks (around 11/16/2023) for follow up.   [name], MD

## 2023-11-12 ENCOUNTER — Encounter: Payer: Self-pay | Admitting: Family Medicine

## 2023-11-12 DIAGNOSIS — Z0000 Encounter for general adult medical examination without abnormal findings: Secondary | ICD-10-CM

## 2023-11-20 ENCOUNTER — Ambulatory Visit: Payer: Self-pay | Admitting: Family Medicine

## 2023-12-31 ENCOUNTER — Other Ambulatory Visit (HOSPITAL_BASED_OUTPATIENT_CLINIC_OR_DEPARTMENT_OTHER): Payer: Self-pay

## 2024-01-01 ENCOUNTER — Ambulatory Visit (INDEPENDENT_AMBULATORY_CARE_PROVIDER_SITE_OTHER): Payer: Self-pay | Admitting: Family Medicine

## 2024-01-01 ENCOUNTER — Encounter: Payer: Self-pay | Admitting: Family Medicine

## 2024-01-01 VITALS — BP 191/113 | HR 89 | Ht 66.0 in | Wt 211.8 lb

## 2024-01-01 DIAGNOSIS — G4733 Obstructive sleep apnea (adult) (pediatric): Secondary | ICD-10-CM

## 2024-01-01 DIAGNOSIS — I1A0 Resistant hypertension: Secondary | ICD-10-CM

## 2024-01-01 DIAGNOSIS — E66811 Obesity, class 1: Secondary | ICD-10-CM

## 2024-01-01 DIAGNOSIS — Z6834 Body mass index (BMI) 34.0-34.9, adult: Secondary | ICD-10-CM

## 2024-01-02 ENCOUNTER — Encounter: Payer: Self-pay | Admitting: Family Medicine

## 2024-01-02 NOTE — Progress Notes (Addendum)
 Established Patient Office Visit  Subjective    Patient ID: Sara Norris, female    DOB: [birth year]  Age: 58 y.o. MRN: [id number]  CC:  Chief Complaint  Patient presents with   Medical Management of Chronic Issues    HPI Sara Norris presents for follow up of hypertension. Patient reports med compliance and denies acute complaints.   Outpatient Encounter Medications as of 01/01/2024  Medication Sig   amLODipine  (NORVASC ) 10 MG tablet Take 1 tablet (10 mg total) by mouth daily.   aspirin  EC 81 MG tablet Take 81 mg by mouth daily.   Blood Pressure Monitoring (BLOOD PRESSURE CUFF) MISC 1 Product by Does not apply route daily. MONITOR YOUR BLOOD PRESSURE 1-2 TIMES A DAY.   carvedilol  (COREG ) 25 MG tablet Take 1 tablet (25 mg total) by mouth 2 (two) times daily with a meal.   diclofenac  Sodium (VOLTAREN ) 1 % GEL Apply 4 g topically 4 (four) times daily.   fluticasone  (FLONASE ) 50 MCG/ACT nasal spray Place 2 sprays into both nostrils daily.   tiZANidine  (ZANAFLEX ) 4 MG tablet Take 1 tablet (4 mg total) by mouth every 8 (eight) hours as needed for muscle spasms.   triamterene -hydrochlorothiazide  (MAXZIDE-25) 37.5-25 MG tablet Take 1 tablet by mouth daily.   valsartan  (DIOVAN ) 320 MG tablet Take 1 tablet (320 mg total) by mouth daily.   COVID-19 mRNA Vac-TriS, Pfizer, SUSP injection Inject into the muscle. (Patient not taking: Reported on 01/01/2024)   methylPREDNISolone  (MEDROL  DOSEPAK) 4 MG TBPK tablet Take as directed. (Patient not taking: Reported on 01/01/2024)   zolpidem  (AMBIEN ) 10 MG tablet Take 1 tablet (10 mg total) by mouth at bedtime as needed for sleep. (Patient not taking: Reported on 01/01/2024)   No facility-administered encounter medications on file as of 01/01/2024.    Past Medical History:  Diagnosis Date   Allergy    seasonal   Chronic knee pain    Colonic diverticular abscess    Contraception    menopausal   Diverticulosis    Hiatal hernia    per pt, not  aware of this.   Hypertension    dx age 48   Menopause    LMP age 41   Umbilical hernia    per pt/ nopt aware of this.    Past Surgical History:  Procedure Laterality Date   CHOLECYSTECTOMY     MYOMECTOMY     removed fibroids/ when she was in her 75s   TONSILLECTOMY      Family History  Problem Relation Age of Onset   Hypertension Mother    Diabetes Mother    Arthritis Mother    Gout Mother    Hypertension Father    Diabetes Father        GP   CAD Maternal Grandmother    Hypertension Other        GP   Stroke Neg Hx    Colon cancer Neg Hx    Breast cancer Neg Hx     Social History   Socioeconomic History   Marital status: Single    Spouse name: Not on file   Number of children: 1   Years of education: Not on file   Highest education level: Not on file  Occupational History   Occupation: enviromental services     Employer: [employer]  Tobacco Use   Smoking status: Never   Smokeless tobacco: Never  Vaping Use   Vaping status: Never Used  Substance and  Sexual Activity   Alcohol use: Yes    Comment: socially.   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 - [facility]   Child lives independently   1 g-child     Social Drivers of Health   Financial Resource Strain: Low Risk  (01/01/2024)   Overall Financial Resource Strain (CARDIA)    Difficulty of Paying Living Expenses: Not hard at all  Food Insecurity: No Food Insecurity (03/23/2022)   Hunger Vital Sign    Worried About Running Out of Food in the Last Year: Never true    Ran Out of Food in the Last Year: Never true  Transportation Needs: No Transportation Needs (03/23/2022)   PRAPARE - Administrator, Civil Service (Medical): No    Lack of Transportation (Non-Medical): No   Physical Activity: Insufficiently Active (01/01/2024)   Exercise Vital Sign    Days of Exercise per Week: 2 days    Minutes of Exercise per Session: 30 min  Stress: No Stress Concern Present (01/01/2024)   [employer] of Occupational Health - Occupational Stress Questionnaire    Feeling of Stress: Not at all  Social Connections: Moderately Isolated (01/01/2024)   Social Connection and Isolation Panel    Frequency of Communication with Friends and Family: More than three times a week    Frequency of Social Gatherings with Friends and Family: Once a week    Attends Religious Services: More than 4 times per year    Active Member of [employer] or Organizations: No    Attends Banker Meetings: Never    Marital Status: Divorced  Catering Manager Violence: Not At Risk (03/23/2022)   Humiliation, Afraid, Rape, and Kick questionnaire    Fear of Current or Ex-Partner: No    Emotionally Abused: No    Physically Abused: No    Sexually Abused: No    Review of Systems  All other systems reviewed and are negative.       Objective    BP (!) 191/113   Pulse 89   Ht 5' 6 (1.676 m)   Wt 211 lb 12.8 oz (96.1 kg)   SpO2 98%   BMI 34.19 kg/m   Physical Exam Vitals and nursing note reviewed.  Constitutional:      General: She is not in acute distress. Cardiovascular:     Rate and Rhythm: Normal rate and regular rhythm.  Pulmonary:     Effort: Pulmonary effort is normal.     Breath sounds: Normal breath sounds.  Abdominal:     Palpations: Abdomen is soft.     Tenderness: There is no abdominal tenderness.  Neurological:     General: No focal deficit present.     Mental Status: She is alert and oriented to person, place, and time.         Assessment & Plan:   Resistant hypertension  Obstructive sleep apnea syndrome -     Pulmonary Visit  Class 1 obesity due to excess calories with serious comorbidity and body mass index (BMI) of 34.0 to 34.9 in adult    Patient defers further eval/mgt of hypertension by cardiology  at this time.   Return in about 2 weeks (around 01/15/2024) for follow up.   Tanda,  Raguel SQUIBB, MD

## 2024-01-03 ENCOUNTER — Other Ambulatory Visit: Payer: Self-pay | Admitting: Family Medicine

## 2024-01-03 NOTE — Telephone Encounter (Signed)
 Complete

## 2024-01-04 ENCOUNTER — Ambulatory Visit: Payer: Self-pay

## 2024-01-04 NOTE — Telephone Encounter (Signed)
 Noted thanks

## 2024-01-04 NOTE — Telephone Encounter (Signed)
 FYI Only or Action Required?: FYI only for provider: home care.  Patient was last seen in primary care on 01/01/2024 by Tanda Bleacher, MD.  Called Nurse Triage reporting Sore Throat.  Symptoms began yesterday.  Interventions attempted: Rest, hydration, or home remedies.  Symptoms are: unchanged.  Triage Disposition: Home Care  Patient/caregiver understands and will follow disposition?: Yes        Copied from CRM #[id number]. Topic: Clinical - Red Word Triage >> Jan 04, 2024  2:50 PM Montie POUR wrote: Red Word that prompted transfer to Nurse Triage:  Her throat has been hurting for about 2 days. Pain level is a 9. It might be from her drainage. Reason for Disposition  [1] Sore throat with cough/cold symptoms AND [2] present < 5 days  Answer Assessment - Initial Assessment Questions 1. ONSET: When did the throat start hurting? (Hours or days ago)      2 days  2. SEVERITY: How bad is the sore throat? (Scale 1-10; mild, moderate or severe)     8-9/10 Still able to drink fluids 3. STREP EXPOSURE: Has there been any exposure to strep within the past week? If Yes, ask: What type of contact occurred?      denies 4.  VIRAL SYMPTOMS: Are there any symptoms of a cold, such as a runny nose, cough, hoarse voice or red eyes?      Thinks r/t drainage 5. FEVER: Do you have a fever? If Yes, ask: What is your temperature, how was it measured, and when did it start?     denies 6. PUS ON THE TONSILS: Is there pus on the tonsils in the back of your throat?     denies 7. OTHER SYMPTOMS: Do you have any other symptoms? (e.g., difficulty breathing, headache, rash)     denies 8. PREGNANCY: Is there any chance you are pregnant? When was your last menstrual period?     N/a  Protocols used: Sore Throat-A-AH

## 2024-01-29 ENCOUNTER — Ambulatory Visit: Payer: Self-pay

## 2024-02-25 ENCOUNTER — Ambulatory Visit (INDEPENDENT_AMBULATORY_CARE_PROVIDER_SITE_OTHER): Payer: Self-pay

## 2024-02-25 VITALS — BP 180/112 | HR 88 | Temp 97.8°F | Ht 68.0 in | Wt 211.0 lb

## 2024-02-25 DIAGNOSIS — I1A0 Resistant hypertension: Secondary | ICD-10-CM | POA: Diagnosis not present

## 2024-02-25 DIAGNOSIS — G4733 Obstructive sleep apnea (adult) (pediatric): Secondary | ICD-10-CM | POA: Diagnosis not present

## 2024-02-25 DIAGNOSIS — J301 Allergic rhinitis due to pollen: Secondary | ICD-10-CM | POA: Diagnosis not present

## 2024-02-25 MED ORDER — SALINE SPRAY 0.65 % NA SOLN: 1.0000 | NASAL | 0 refills | Status: AC | PRN

## 2024-02-25 MED ORDER — FLUTICASONE PROPIONATE 50 MCG/ACT NA SUSP: 2.0000 | Freq: Every day | NASAL | 2 refills | Status: AC

## 2024-02-25 NOTE — Assessment & Plan Note (Addendum)
  Orders:   Home sleep test; Future

## 2024-02-25 NOTE — Patient Instructions (Signed)
" °  VISIT SUMMARY:  Today, we discussed your severe obstructive sleep apnea (OSA) and allergic rhinitis. We reviewed your symptoms, including loud snoring, waking up gasping for air, and daytime fatigue. We also talked about your nasal congestion and allergies.  YOUR PLAN:  -OBSTRUCTIVE SLEEP APNEA: Obstructive sleep apnea (OSA) is a condition where your airway becomes blocked during sleep, causing breathing pauses and poor sleep quality. To help manage this, we have ordered a home sleep study and will prescribe a CPAP machine with a nasal or hybrid mask once sleep study shows OSA. This should improve your sleep quality and help control your blood pressure. We also discussed how to use the CPAP machine and what your insurance requires for coverage.  -ALLERGIC RHINITIS DUE TO POLLEN: Allergic rhinitis is an allergic reaction that causes nasal congestion, sneezing, and a runny nose. To manage your symptoms, we have prescribed Flonase  nasal spray. Use two sprays in each nostril once daily for seven days, then reduce to one spray daily. Additionally, you can use a saline nasal spray for relief.    We did discuss about need for compliance to meet insurance requirement.  The patient has to use CPAP for at least 5 out of 7 days in a week for a consecutive month within the first 3 months after getting CPAP.  "

## 2024-02-25 NOTE — Progress Notes (Signed)
 "  Pulmonology Office Visit   Subjective:  Patient ID: Sara Norris, female    DOB: 21-Dec-1965  MRN: [id number]  Referred by: Tanda Bleacher, MD  CC:  Chief Complaint  Patient presents with   Consult    Snoring and apneas during sleep    HPI Sara Norris is a 59 y.o. female with resistant hypertension on 4 blood pressure medication presents for evaluation of OSA.  Respective notes from provider reviewed as appropriate to gather relevant information for patient care.   Discussed the use of AI scribe software for clinical note transcription with the patient, who gave verbal consent to proceed.  History of Present Illness   Sara Norris is a 59 year old female with resistant hypertension and severe obstructive sleep apnea who presents for evaluation of OSA.  She has a history of resistant hypertension, currently managed with four blood pressure medications, although her blood pressure remains uncontrolled. She has been switched between several medications over time.  In 2021, she underwent a sleep study that diagnosed her with severe obstructive sleep apnea, but she has not received treatment for it. She reports loud snoring, waking up gasping for air, and experiencing daytime fatigue. 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 She typically goes to bed around 11 PM, falls asleep quickly, but wakes up five to six times per night, often at 3 AM, and then again at 6 AM. She does not take naps during the day as they make her feel more tired.  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 She does not smoke and drinks alcohol socially, about once a week. She sleeps on her side and believes she sleeps with her mouth open, experiencing dry mouth in the morning.  She has allergies, for which she occasionally takes Zyrtec. She experiences nasal congestion with her  allergies, which are most significant in the spring but have been present year-round recently. She has not used Flonase  before.       OSA history: Severe sleep apnea diagnosed in Jan 2021.Was never treated  PRIOR TESTS and IMAGING: PSG/HSAT:  NPSG Jan 2021: Weight 212 pounds, AHI 44.3, O2 nadir 80%, desaturation 21.3% of 276-minute.  PLM 0.9.  Supine dominant.  ECHO: Echo 03/2022: EF 65-70%, grade 1 diastolic dysfunction, normal right function.      02/25/2024    9:00 AM  Results of the Epworth flowsheet  Sitting and reading 1  Watching TV 1  Sitting, inactive in a public place (e.g. a theatre or a meeting) 0  As a passenger in a car for an hour without a break 0  Lying down to rest in the afternoon when circumstances permit 0  Sitting and talking to someone 0  Sitting quietly after a lunch without alcohol 1  In a car, while stopped for a few minutes in traffic 0  Total score 3    Allergies: Soy allergy (obsolete), Other, Peanut-containing drug products, Citrus, Codeine, and Pollen extract Current Medications[1] Past Medical History:  Diagnosis Date   Allergy    seasonal   Chronic knee pain    Colonic diverticular abscess    Contraception    menopausal   Diverticulosis    Hiatal hernia    per pt, not aware of this.   Hypertension    dx age 15   Menopause    LMP age 75   Umbilical  hernia    per pt/ nopt aware of this.   Past Surgical History:  Procedure Laterality Date   CHOLECYSTECTOMY     MYOMECTOMY     removed fibroids/ when she was in her 63s   TONSILLECTOMY     Family History  Problem Relation Age of Onset   Hypertension Mother    Diabetes Mother    Arthritis Mother    Gout Mother    Hypertension Father    Diabetes Father        GP   CAD Maternal Grandmother    Hypertension Other        GP   Stroke Neg Hx    Colon cancer Neg Hx    Breast cancer Neg Hx    Social History   Socioeconomic History   Marital status: Single    Spouse name: Not on  file   Number of children: 1   Years of education: Not on file   Highest education level: Not on file  Occupational History   Occupation: enviromental services     Employer:  [employer]  Tobacco Use   Smoking status: Never   Smokeless tobacco: Never  Vaping Use   Vaping status: Never Used  Substance and Sexual Activity   Alcohol use: Yes    Comment: socially.   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 - [employer]   Child lives independently   1 g-child     Social Drivers of Health   Tobacco Use: Low Risk (02/25/2024)   Patient History    Smoking Tobacco Use: Never    Smokeless Tobacco Use: Never    Passive Exposure: Not on file  Financial Resource Strain: Low Risk (01/01/2024)   Overall Financial Resource Strain (CARDIA)    Difficulty of Paying Living Expenses: Not hard at all  Food Insecurity: No Food Insecurity (03/23/2022)   Hunger Vital Sign    Worried About Running Out of Food in the Last Year: Never true    Ran Out of Food in the Last Year: Never true  Transportation Needs: No Transportation Needs (03/23/2022)   PRAPARE - Administrator, Civil Service (Medical): No    Lack of Transportation (Non-Medical): No  Physical Activity: Insufficiently Active (01/01/2024)   Exercise Vital Sign    Days of Exercise per Week: 2 days    Minutes of Exercise per Session: 30 min  Stress: No Stress Concern Present (01/01/2024)   [employer] of Occupational Health - Occupational Stress Questionnaire    Feeling of Stress: Not at all  Social Connections: Moderately Isolated (01/01/2024)   Social Connection and Isolation Panel    Frequency of Communication with Friends and Family: More than three times a week    Frequency of Social Gatherings with Friends and  Family: Once a week    Attends Religious Services: More than 4 times per year    Active Member of [employer] or Organizations: No    Attends Banker Meetings: Never    Marital Status: Divorced  Catering Manager Violence: Not At Risk (03/23/2022)   Humiliation, Afraid, Rape, and Kick questionnaire    Fear of Current or Ex-Partner: No    Emotionally Abused: No    Physically  Abused: No    Sexually Abused: No  Depression (PHQ2-9): Low Risk (01/01/2024)   Depression (PHQ2-9)    PHQ-2 Score: 0  Alcohol Screen: Low Risk (01/01/2024)   Alcohol Screen    Last Alcohol Screening Score (AUDIT): 1  Housing: Low Risk (03/23/2022)   Housing    Last Housing Risk Score: 0  Utilities: Not At Risk (03/23/2022)   A[employer] Utilities    Threatened with loss of utilities: No  Health Literacy: Adequate Health Literacy (01/01/2024)   B1300 Health Literacy    Frequency of need for help with medical instructions: Never       Objective:  BP (!) 186/110   Pulse 88   Temp 97.8 F (36.6 C) (Temporal)   Ht 5' 8 (1.727 m)   Wt 211 lb (95.7 kg)   SpO2 97% Comment: room air  BMI 32.08 kg/m  BMI Readings from Last 3 Encounters:  02/25/24 32.08 kg/m  01/01/24 34.19 kg/m  11/02/23 33.83 kg/m    Physical Exam: Physical Exam   ENT: Normal mucosa. No hypertrophy of inferior turbinates. Tonsils are normal sized. Modified Mallampati score of four. Nasal congestion noted. PULMONARY: Lungs clear to auscultation bilaterally, no adventitious breath sounds. CARDIOVASCULAR: Regular rate and rhythm, S1 S2 normal, no murmurs. ABDOMEN: Abdomen soft, nontender. Bowel sounds are normal. EXTREMITIES: No peripheral edema noted.       Diagnostic Review:  Last metabolic panel Lab Results  Component Value Date   GLUCOSE 101 (H) 07/05/2023   NA 137 07/05/2023   K 3.3 (L) 07/05/2023   CL 99 07/05/2023   CO2 28 07/05/2023   BUN 19 07/05/2023   CREATININE 1.06 (H) 07/05/2023   GFRNONAA >60 07/05/2023   CALCIUM   9.5 07/05/2023   PROT 8.0 07/05/2023   ALBUMIN 4.1 07/05/2023   BILITOT 0.9 07/05/2023   ALKPHOS 86 07/05/2023   AST 18 07/05/2023   ALT 15 07/05/2023   ANIONGAP 10 07/05/2023         Assessment & Plan:   Assessment & Plan OSA (obstructive sleep apnea)  Orders:   Home sleep test; Future  Allergic rhinitis due to pollen, unspecified seasonality     Resistant hypertension On 4 BP meds. Asked to take her hydrochlorothiazide -triamtere now. She has not take her morning meds. Currenlty 186/110. Will recheck BP. Her SBP usually runs in 200s.       I discussed with the patient the pathophysiology of obstructive sleep apnea, its association with weight, and its negative effects on hypertension, diabetes, mental health, A-fib, stroke if left untreated.  I briefly discussed the treatment options for obstructive sleep apnea  Assessment and Plan    Obstructive sleep apnea Severe obstructive sleep apnea diagnosed in 2021, untreated. CPAP therapy recommended to improve sleep quality and aid in blood pressure control. She prefers nasal mask due to discomfort with full face masks. - Ordered home sleep study. - Will prescribe CPAP machine with nasal mask or hybrid mask. - Educated on CPAP usage and troubleshooting. - Advised on insurance requirements for CPAP usage.  Allergic rhinitis due to pollen Allergic rhinitis with nasal congestion, primarily in spring but increasingly year-round. She has not used Flonase  before. - Prescribed Flonase  nasal spray, two sprays in each nostril once daily for seven days, then one spray daily. - Prescribed saline nasal spray for symptomatic relief.       Notes from 01/01/24 by PCP reviewed as to gather relevant information for patient care and formulating plan.  he was counselled about  not driving while drowsy which is common side effect of sleep related disorders.   Return for 1 mnth after Sleep study.   I personally spent a total of 30 minutes in  the care of the patient today including preparing to see the patient, getting/reviewing separately obtained history, performing a medically appropriate exam/evaluation, counseling and educating, placing orders, documenting clinical information in the EHR, independently interpreting results, and communicating results.   Sammi Fredericks, MD      [1]  Current Outpatient Medications:    amLODipine  (NORVASC ) 10 MG tablet, TAKE 1 TABLET BY MOUTH EVERY DAY, Disp: 90 tablet, Rfl: 0   aspirin  EC 81 MG tablet, Take 81 mg by mouth daily., Disp: , Rfl:    Blood Pressure Monitoring (BLOOD PRESSURE CUFF) MISC, 1 Product by Does not apply route daily. MONITOR YOUR BLOOD PRESSURE 1-2 TIMES A DAY., Disp: 1 each, Rfl: 0   carvedilol  (COREG ) 25 MG tablet, TAKE 1 TABLET (25 MG TOTAL) BY MOUTH TWICE A DAY WITH MEALS, Disp: 180 tablet, Rfl: 0   diclofenac  Sodium (VOLTAREN ) 1 % GEL, Apply 4 g topically 4 (four) times daily., Disp: 100 g, Rfl: 1   fluticasone  (FLONASE ) 50 MCG/ACT nasal spray, Place 2 sprays into both nostrils daily., Disp: 16 g, Rfl: 2   sodium chloride  (OCEAN) 0.65 % SOLN nasal spray, Place 1 spray into both nostrils as needed for congestion., Disp: 60 mL, Rfl: 0   valsartan  (DIOVAN ) 320 MG tablet, Take 1 tablet (320 mg total) by mouth daily., Disp: 90 tablet, Rfl: 0  "

## 2024-03-03 ENCOUNTER — Encounter: Payer: Self-pay | Admitting: Family Medicine

## 2024-03-11 ENCOUNTER — Ambulatory Visit

## 2024-03-11 DIAGNOSIS — G4733 Obstructive sleep apnea (adult) (pediatric): Secondary | ICD-10-CM

## 2024-03-20 ENCOUNTER — Telehealth: Payer: Self-pay

## 2024-03-20 DIAGNOSIS — G4733 Obstructive sleep apnea (adult) (pediatric): Secondary | ICD-10-CM

## 2024-03-20 NOTE — Telephone Encounter (Signed)
 Date of Study: 03/11/24  Interpretation: Severe OSA with AHI4% of 78.6 (33.4/hr central events), O2 desaturation 36 min. O2 nadir 79%. Periodic breathing was seen.    Plan: CPAP titration study.  Side sleep.  Please schedule follow up in clinic to discuss sleep study results.   Sammi Fredericks, MD.

## 2024-03-21 NOTE — Telephone Encounter (Signed)
 It was sent to be scheduled 1/30 will inform pt as schedule F/U when it has been scheduled.

## 2024-03-21 NOTE — Telephone Encounter (Signed)
 Called and spoke with the pt and advised of sleep study results. Pt is aware and will have cpap titration completed.  [facility] please schedule titration and an ov with Dr. Theodoro to review results.  Thanks!

## 2024-04-17 ENCOUNTER — Encounter: Admitting: Family Medicine
